# Patient Record
Sex: Female | Born: 1955 | Race: White | Hispanic: No | Marital: Married | Smoking: Never smoker
Health system: Southern US, Community
[De-identification: ages and names within clinical notes are randomized; demographics above are authoritative.]

## PROBLEM LIST (undated history)

## (undated) DIAGNOSIS — T8859XA Other complications of anesthesia, initial encounter: Secondary | ICD-10-CM

## (undated) DIAGNOSIS — H269 Unspecified cataract: Secondary | ICD-10-CM

## (undated) DIAGNOSIS — F32A Depression, unspecified: Secondary | ICD-10-CM

## (undated) DIAGNOSIS — F419 Anxiety disorder, unspecified: Secondary | ICD-10-CM

## (undated) DIAGNOSIS — D649 Anemia, unspecified: Secondary | ICD-10-CM

## (undated) DIAGNOSIS — M8580 Other specified disorders of bone density and structure, unspecified site: Secondary | ICD-10-CM

## (undated) DIAGNOSIS — Z9889 Other specified postprocedural states: Secondary | ICD-10-CM

## (undated) DIAGNOSIS — Z8489 Family history of other specified conditions: Secondary | ICD-10-CM

## (undated) DIAGNOSIS — M3500 Sicca syndrome, unspecified: Secondary | ICD-10-CM

## (undated) DIAGNOSIS — R112 Nausea with vomiting, unspecified: Secondary | ICD-10-CM

## (undated) DIAGNOSIS — M810 Age-related osteoporosis without current pathological fracture: Secondary | ICD-10-CM

## (undated) DIAGNOSIS — R011 Cardiac murmur, unspecified: Secondary | ICD-10-CM

## (undated) DIAGNOSIS — G43909 Migraine, unspecified, not intractable, without status migrainosus: Secondary | ICD-10-CM

## (undated) DIAGNOSIS — K219 Gastro-esophageal reflux disease without esophagitis: Secondary | ICD-10-CM

## (undated) DIAGNOSIS — C801 Malignant (primary) neoplasm, unspecified: Secondary | ICD-10-CM

## (undated) DIAGNOSIS — E559 Vitamin D deficiency, unspecified: Secondary | ICD-10-CM

## (undated) DIAGNOSIS — F329 Major depressive disorder, single episode, unspecified: Secondary | ICD-10-CM

## (undated) DIAGNOSIS — M1990 Unspecified osteoarthritis, unspecified site: Secondary | ICD-10-CM

## (undated) DIAGNOSIS — Q210 Ventricular septal defect: Secondary | ICD-10-CM

## (undated) DIAGNOSIS — T4145XA Adverse effect of unspecified anesthetic, initial encounter: Secondary | ICD-10-CM

## (undated) HISTORY — DX: Migraine, unspecified, not intractable, without status migrainosus: G43.909

## (undated) HISTORY — DX: Sjogren syndrome, unspecified: M35.00

## (undated) HISTORY — DX: Malignant (primary) neoplasm, unspecified: C80.1

## (undated) HISTORY — PX: OTHER SURGICAL HISTORY: SHX169

## (undated) HISTORY — PX: ABLATION ON ENDOMETRIOSIS: SHX5787

## (undated) HISTORY — DX: Other specified disorders of bone density and structure, unspecified site: M85.80

## (undated) HISTORY — DX: Vitamin D deficiency, unspecified: E55.9

## (undated) HISTORY — DX: Ventricular septal defect: Q21.0

## (undated) HISTORY — DX: Unspecified cataract: H26.9

## (undated) HISTORY — DX: Age-related osteoporosis without current pathological fracture: M81.0

## (undated) HISTORY — DX: Gastro-esophageal reflux disease without esophagitis: K21.9

## (undated) HISTORY — DX: Anemia, unspecified: D64.9

## (undated) HISTORY — PX: DILATION AND CURETTAGE OF UTERUS: SHX78

---
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
## 1974-05-12 HISTORY — PX: OTHER SURGICAL HISTORY: SHX169

## 1999-07-11 ENCOUNTER — Other Ambulatory Visit: Admission: RE | Admit: 1999-07-11 | Discharge: 1999-07-11 | Payer: Self-pay | Admitting: Obstetrics & Gynecology

## 1999-09-11 ENCOUNTER — Ambulatory Visit (HOSPITAL_COMMUNITY): Admission: RE | Admit: 1999-09-11 | Discharge: 1999-09-11 | Payer: Self-pay | Admitting: Obstetrics & Gynecology

## 1999-09-11 ENCOUNTER — Encounter (INDEPENDENT_AMBULATORY_CARE_PROVIDER_SITE_OTHER): Payer: Self-pay

## 2000-08-03 ENCOUNTER — Other Ambulatory Visit: Admission: RE | Admit: 2000-08-03 | Discharge: 2000-08-03 | Payer: Self-pay | Admitting: Obstetrics & Gynecology

## 2001-08-12 ENCOUNTER — Other Ambulatory Visit: Admission: RE | Admit: 2001-08-12 | Discharge: 2001-08-12 | Payer: Self-pay | Admitting: Obstetrics & Gynecology

## 2002-09-28 ENCOUNTER — Other Ambulatory Visit: Admission: RE | Admit: 2002-09-28 | Discharge: 2002-09-28 | Payer: Self-pay | Admitting: Obstetrics & Gynecology

## 2003-12-13 ENCOUNTER — Other Ambulatory Visit: Admission: RE | Admit: 2003-12-13 | Discharge: 2003-12-13 | Payer: Self-pay | Admitting: Obstetrics & Gynecology

## 2004-01-16 ENCOUNTER — Ambulatory Visit (HOSPITAL_COMMUNITY): Admission: RE | Admit: 2004-01-16 | Discharge: 2004-01-16 | Payer: Self-pay | Admitting: Obstetrics & Gynecology

## 2005-01-16 ENCOUNTER — Other Ambulatory Visit: Admission: RE | Admit: 2005-01-16 | Discharge: 2005-01-16 | Payer: Self-pay | Admitting: Obstetrics & Gynecology

## 2010-03-28 ENCOUNTER — Other Ambulatory Visit: Admission: RE | Admit: 2010-03-28 | Discharge: 2010-03-28 | Payer: Self-pay | Admitting: Internal Medicine

## 2010-04-15 ENCOUNTER — Ambulatory Visit (HOSPITAL_COMMUNITY)
Admission: RE | Admit: 2010-04-15 | Discharge: 2010-04-15 | Payer: Self-pay | Source: Home / Self Care | Admitting: Internal Medicine

## 2010-04-15 LAB — HM DEXA SCAN

## 2010-05-12 HISTORY — PX: COLONOSCOPY: SHX5424

## 2010-06-03 ENCOUNTER — Encounter
Admission: RE | Admit: 2010-06-03 | Discharge: 2010-06-03 | Payer: Self-pay | Source: Home / Self Care | Attending: Internal Medicine | Admitting: Internal Medicine

## 2010-07-02 ENCOUNTER — Other Ambulatory Visit: Payer: Self-pay | Admitting: Gastroenterology

## 2010-07-02 DIAGNOSIS — R110 Nausea: Secondary | ICD-10-CM

## 2010-07-02 DIAGNOSIS — R6881 Early satiety: Secondary | ICD-10-CM

## 2010-07-04 ENCOUNTER — Ambulatory Visit
Admission: RE | Admit: 2010-07-04 | Discharge: 2010-07-04 | Disposition: A | Discharge status: Home / Self Care | Payer: BC Managed Care – PPO | Source: Ambulatory Visit | Attending: Gastroenterology | Admitting: Gastroenterology

## 2010-07-04 DIAGNOSIS — R110 Nausea: Secondary | ICD-10-CM

## 2010-07-04 DIAGNOSIS — R6881 Early satiety: Secondary | ICD-10-CM

## 2010-09-27 NOTE — Op Note (Signed)
NAME:  Bianca Fernandez, Bianca Fernandez                           ACCOUNT NO.:  [id number]   MEDICAL RECORD NO.:  [id number]                   PATIENT TYPE:  AMB   LOCATION:  SDC                                  FACILITY:  [facility]   PHYSICIAN:  Freddy Finner, M.D.                DATE OF BIRTH:  05-06-1956   DATE OF PROCEDURE:  01/16/2004  DATE OF DISCHARGE:                                 OPERATIVE REPORT   PREOPERATIVE DIAGNOSES:  1.  Uterine enlargement, probable uterine fibroid.  2.  Menorrhagia with secondary anemia.  3.  Known previous diagnosis of endometriosis and uterine enlargement in      2001.   POSTOPERATIVE DIAGNOSES:  1.  Uterine enlargement, probable uterine fibroid.  2.  Menorrhagia with secondary anemia.  3.  Known previous diagnosis of endometriosis and uterine enlargement in      2001.  4.  Small endometrial polyp.   OPERATIVE PROCEDURES:  1.  Hysteroscopy.  2.  Dilatation and curettage.  3.  Resection of endometrial polyp.  4.  NovaSure endometrial ablation.   SURGEON:  Freddy Finner, M.D.   ANESTHESIA:  General and paracervical block.   INTRAOPERATIVE COMPLICATIONS:  None.   INDICATION:  The patient is a 55 year old with a very long history of  menorrhagia.  This has been more pronounced in the recent past, having had  previous, more conservative surgery.  She is now admitted for endometrial  ablation as well as hysteroscopy and D&C.   She was admitted on the morning of surgery because of ventriculoseptal  defect.  She was given amoxicillin and gentamicin IV.  Immediately after the  procedure in recovery, she was given a gram of Rocephin IV.   DESCRIPTION OF PROCEDURE:  She was brought to the operating room and after  being placed under general anesthesia, she was placed in the dorsal  lithotomy position.  Betadine prep was carried out in the usual fashion.  Cervix was visualized using a bivalved speculum.  Cervix was grasped on the  anterior lip with a single-tooth  tenaculum.  Paracervical block was placed  using a total of 10 mL of 1% Xylocaine.  Cervix sounded to 2.5 cm.  Uterus  and cervix sounded to 8.5 cm, making the cavity length 6 cm.  Cervix was  progressively dilated with Shawnie Pons dilators.  A 12.5-degree [facility] hysteroscope  was introduced using 3% sorbitol as the distending medium.  Inspection of  endometrial cavity was carried out.  There was a polypoid-looking lesion on  the anterior fundal surface; no other cavitary abnormality was noted.  Gentle thorough curettage and exploration with Randall stone forceps was  carried out to sample and remove the tissue.  The NovaSure device was then  placed.  The additional measurement was a cavity width which is 3.5 cm.  After an appropriate carbon dioxide test and the integrity of the cavity and  placement  were confirmed, the ablation procedure was carried out; this  required a length of 77 seconds and a power of 116 watts.  Re-inspection of  the cavity was carried out using the hysteroscope and photographs were made  of this.  The procedure was then terminated and all instruments removed.  The patient was awakened and taken to the recovery room in good condition.  She was given 30 mg of Toradol IV in the operating room and 30 mg IM for  postoperative analgesia.   She is to return to the office in approximately 7-10 days.  She has routine  outpatient surgical instructions.                                               Freddy Finner, M.D.    WRN/MEDQ  D:  01/16/2004  T:  01/17/2004  Job:  [id number]

## 2011-01-30 ENCOUNTER — Other Ambulatory Visit (HOSPITAL_COMMUNITY): Payer: Self-pay | Admitting: Internal Medicine

## 2011-02-04 ENCOUNTER — Ambulatory Visit (HOSPITAL_COMMUNITY)
Admission: RE | Admit: 2011-02-04 | Discharge: 2011-02-04 | Disposition: A | Discharge status: Home / Self Care | Payer: BC Managed Care – PPO | Source: Ambulatory Visit | Attending: Internal Medicine | Admitting: Internal Medicine

## 2011-02-04 DIAGNOSIS — R6889 Other general symptoms and signs: Secondary | ICD-10-CM | POA: Insufficient documentation

## 2011-02-04 DIAGNOSIS — M47812 Spondylosis without myelopathy or radiculopathy, cervical region: Secondary | ICD-10-CM | POA: Insufficient documentation

## 2011-02-04 DIAGNOSIS — R1310 Dysphagia, unspecified: Secondary | ICD-10-CM | POA: Insufficient documentation

## 2011-02-04 DIAGNOSIS — K449 Diaphragmatic hernia without obstruction or gangrene: Secondary | ICD-10-CM | POA: Insufficient documentation

## 2011-06-13 HISTORY — PX: OTHER SURGICAL HISTORY: SHX169

## 2011-06-24 LAB — FECAL OCCULT BLOOD, GUAIAC: Fecal Occult Blood: NEGATIVE

## 2012-08-19 ENCOUNTER — Other Ambulatory Visit: Payer: Self-pay | Admitting: Gastroenterology

## 2012-08-19 DIAGNOSIS — R1310 Dysphagia, unspecified: Secondary | ICD-10-CM

## 2012-08-20 ENCOUNTER — Ambulatory Visit
Admission: RE | Admit: 2012-08-20 | Discharge: 2012-08-20 | Disposition: A | Discharge status: Home / Self Care | Payer: BC Managed Care – PPO | Source: Ambulatory Visit | Attending: Gastroenterology | Admitting: Gastroenterology

## 2012-08-20 DIAGNOSIS — R1310 Dysphagia, unspecified: Secondary | ICD-10-CM

## 2013-01-28 HISTORY — PX: ESOPHAGOGASTRODUODENOSCOPY: SHX1529

## 2013-04-18 ENCOUNTER — Encounter: Payer: Self-pay | Admitting: Internal Medicine

## 2013-04-18 DIAGNOSIS — M810 Age-related osteoporosis without current pathological fracture: Secondary | ICD-10-CM | POA: Insufficient documentation

## 2013-04-18 DIAGNOSIS — M3500 Sicca syndrome, unspecified: Secondary | ICD-10-CM | POA: Insufficient documentation

## 2013-04-18 DIAGNOSIS — M8580 Other specified disorders of bone density and structure, unspecified site: Secondary | ICD-10-CM | POA: Insufficient documentation

## 2013-04-18 DIAGNOSIS — E559 Vitamin D deficiency, unspecified: Secondary | ICD-10-CM | POA: Insufficient documentation

## 2013-04-18 DIAGNOSIS — G43909 Migraine, unspecified, not intractable, without status migrainosus: Secondary | ICD-10-CM | POA: Insufficient documentation

## 2013-04-18 DIAGNOSIS — Q210 Ventricular septal defect: Secondary | ICD-10-CM | POA: Insufficient documentation

## 2013-04-19 ENCOUNTER — Encounter: Payer: Self-pay | Admitting: Emergency Medicine

## 2013-04-19 ENCOUNTER — Ambulatory Visit (INDEPENDENT_AMBULATORY_CARE_PROVIDER_SITE_OTHER): Payer: BC Managed Care – PPO | Admitting: Emergency Medicine

## 2013-04-19 VITALS — BP 150/82 | HR 78 | Temp 98.0°F | Resp 16 | Ht 68.25 in | Wt 158.0 lb

## 2013-04-19 DIAGNOSIS — R1310 Dysphagia, unspecified: Secondary | ICD-10-CM

## 2013-04-19 DIAGNOSIS — E559 Vitamin D deficiency, unspecified: Secondary | ICD-10-CM

## 2013-04-19 DIAGNOSIS — Z79899 Other long term (current) drug therapy: Secondary | ICD-10-CM

## 2013-04-19 DIAGNOSIS — Z0000 Encounter for general adult medical examination without abnormal findings: Secondary | ICD-10-CM

## 2013-04-19 DIAGNOSIS — M3500 Sicca syndrome, unspecified: Secondary | ICD-10-CM

## 2013-04-19 DIAGNOSIS — D649 Anemia, unspecified: Secondary | ICD-10-CM

## 2013-04-19 DIAGNOSIS — Z1212 Encounter for screening for malignant neoplasm of rectum: Secondary | ICD-10-CM

## 2013-04-19 DIAGNOSIS — Z111 Encounter for screening for respiratory tuberculosis: Secondary | ICD-10-CM

## 2013-04-19 DIAGNOSIS — R030 Elevated blood-pressure reading, without diagnosis of hypertension: Secondary | ICD-10-CM

## 2013-04-19 DIAGNOSIS — Z23 Encounter for immunization: Secondary | ICD-10-CM

## 2013-04-19 LAB — CBC WITH DIFFERENTIAL/PLATELET
Basophils Absolute: 0 10*3/uL (ref 0.0–0.1)
Eosinophils Absolute: 0.1 10*3/uL (ref 0.0–0.7)
Lymphs Abs: 2.1 10*3/uL (ref 0.7–4.0)
MCH: 29.4 pg (ref 26.0–34.0)
MCV: 85.4 fL (ref 78.0–100.0)
Monocytes Absolute: 0.6 10*3/uL (ref 0.1–1.0)
Platelets: 300 10*3/uL (ref 150–400)

## 2013-04-19 LAB — HEPATIC FUNCTION PANEL
Alkaline Phosphatase: 71 U/L (ref 39–117)
Indirect Bilirubin: 0.6 mg/dL (ref 0.0–0.9)

## 2013-04-19 LAB — BASIC METABOLIC PANEL WITH GFR
CO2: 28 mEq/L (ref 19–32)
Chloride: 94 mEq/L — ABNORMAL LOW (ref 96–112)
Creat: 0.59 mg/dL (ref 0.50–1.10)
GFR, Est African American: >89 mL/min
GFR, Est Non African American: >89 mL/min
Potassium: 4.5 mEq/L (ref 3.5–5.3)

## 2013-04-19 LAB — HEMOGLOBIN A1C: Mean Plasma Glucose: 111 mg/dL (ref <117)

## 2013-04-19 LAB — LIPID PANEL
LDL Cholesterol: 89 mg/dL (ref 0–99)
Total CHOL/HDL Ratio: 2.1 Ratio
VLDL: 10 mg/dL (ref 0–40)

## 2013-04-19 NOTE — Patient Instructions (Addendum)
Hypertension Hypertension is another name for high blood pressure. High blood pressure may mean that your heart needs to work harder to pump blood. Blood pressure consists of two numbers, which includes a higher number over a lower number (example: 110/72). HOME CARE   Make lifestyle changes as told by your doctor. This may include weight loss and exercise.  Take your blood pressure medicine every day.  Limit how much salt you use.  Stop smoking if you smoke.  Do not use drugs.  Talk to your doctor if you are using decongestants or birth control pills. These medicines might make blood pressure higher.  Females should not drink more than 1 alcoholic drink per day. Males should not drink more than 2 alcoholic drinks per day.  See your doctor as told. GET HELP RIGHT AWAY IF:   You have a blood pressure reading with a top number of 180 or higher.  You get a very bad headache.  You get blurred or changing vision.  You feel confused.  You feel weak, numb, or faint.  You get chest or belly (abdominal) pain.  You throw up (vomit).  You cannot breathe very well. MAKE SURE YOU:   Understand these instructions.  Will watch your condition.  Will get help right away if you are not doing well or get worse. Document Released: 10/15/2007 Document Revised: 07/21/2011 Document Reviewed: 10/15/2007 [facility] Patient Information 2014 [place], [place]. Dysphagia Swallowing problems (dysphagia) occur when solids and liquids seem to stick in your throat on the way down to your stomach, or the food takes longer to get to the stomach. Other symptoms include regurgitating food, noises coming from the throat, chest discomfort with swallowing, and a feeling of fullness or the feeling of something being stuck in your throat when swallowing. When blockage in your throat is complete it may be associated with drooling. CAUSES  Problems with swallowing may occur because of problems with the muscles. The  food cannot be propelled in the usual manner into your stomach. You may have ulcers, scar tissue, or inflammation in the tube down which food travels from your mouth to your stomach (esophagus), which blocks food from passing normally into the stomach. Causes of inflammation include:  Acid reflux from your stomach into your esophagus.  Infection.  Radiation treatment for cancer.  Medicines taken without enough fluids to wash them down into your stomach. You may have nerve problems that prevent signals from being sent to the muscles of your esophagus to contract and move your food down to your stomach. Globus pharyngeus is a relatively common problem in which there is a sense of an obstruction or difficulty in swallowing, without any physical abnormalities of the swallowing passages being found. This problem usually improves over time with reassurance and testing to rule out other causes. DIAGNOSIS Dysphagia can be diagnosed and its cause can be determined by tests in which you swallow a white substance that helps illuminate the inside of your throat (contrast medium) while X-rays are taken. Sometimes a flexible telescope that is inserted down your throat (endoscopy) to look at your esophagus and stomach is used. TREATMENT   If the dysphagia is caused by acid reflux or infection, medicines may be used.  If the dysphagia is caused by problems with your swallowing muscles, swallowing therapy may be used to help you strengthen your swallowing muscles.  If the dysphagia is caused by a blockage or mass, procedures to remove the blockage may be done. HOME CARE INSTRUCTIONS  Try to eat soft food that is easier to swallow and check your weight on a daily basis to be sure that it is not decreasing.  Be sure to drink liquids when sitting upright (not lying down). SEEK MEDICAL CARE IF:  You are losing weight because you are unable to swallow.  You are coughing when you drink liquids  (aspiration).  You are coughing up partially digested food. SEEK IMMEDIATE MEDICAL CARE IF:  You are unable to swallow your own saliva .  You are having shortness of breath or a fever, or both.  You have a hoarse voice along with difficulty swallowing. MAKE SURE YOU:  Understand these instructions.  Will watch your condition.  Will get help right away if you are not doing well or get worse. Document Released: 04/25/2000 Document Revised: 12/29/2012 Document Reviewed: 10/15/2012 [facility] Patient Information 2014 [place], [place].

## 2013-04-20 LAB — VITAMIN D 25 HYDROXY (VIT D DEFICIENCY, FRACTURES): Vit D, 25-Hydroxy: 49 ng/mL (ref 30–89)

## 2013-04-20 LAB — URINALYSIS, ROUTINE W REFLEX MICROSCOPIC
Bilirubin Urine: NEGATIVE
Glucose, UA: NEGATIVE mg/dL
Specific Gravity, Urine: 1.013 (ref 1.005–1.030)
Urobilinogen, UA: 0.2 mg/dL (ref 0.0–1.0)
pH: 7 (ref 5.0–8.0)

## 2013-04-20 LAB — MICROALBUMIN / CREATININE URINE RATIO: Microalb, Ur: 0.51 mg/dL (ref 0.00–1.89)

## 2013-04-20 LAB — TSH: TSH: 4.006 u[IU]/mL (ref 0.350–4.500)

## 2013-04-21 NOTE — Progress Notes (Signed)
Subjective:    Patient ID: Bianca Fernandez, female    DOB: [birth year], 57 y.o.   MRN: [id number]  HPI Comments: 57 yo WF for CPE and f/u anemia, Vit D Deficiency. She is doing reasonably well. She notes she is not exercising routinely. She eats healthy for the most part but noticeable decreased appetite with increased dysphagia. She had repeat EGD with Dr.Schooler again negative. She also notes no relief with protonix with reflux. She often feels something is stuck in throat, even if she has not had any recent food or beverage.   She does not routinely check her BP at home but last 2 OV with BP of 140s/70s. She denies history of HTN but notes + for VSD. She has a heart murmur and abdominal bruit that Dr. Royann Shivers "keeps active check on", she denies any changes of these.  She had an abnormal PAP last year with repeat by Dr. Jennette Kettle 07/24/12 WNL.  Her Sjogrens is followed by Dr. Dareen Piano and seems stable, she has appointment 05/2013. She has been off the Prednisone for a couple of months.  She notes her anxiety has improved and is no longer on medication.     Current Outpatient Prescriptions on File Prior to Visit  Medication Sig Dispense Refill  . B Complex-C (SUPER B COMPLEX PO) Take by mouth.      . calcium-vitamin D (OSCAL WITH D) 250-125 MG-UNIT per tablet Take 1 tablet by mouth daily.      . Cholecalciferol (VITAMIN D3) 3000 UNITS TABS Take by mouth.      . Magnesium 250 MG TABS Take by mouth.      . esomeprazole (NEXIUM) 40 MG capsule Take 40 mg by mouth daily at 12 noon.      . pantoprazole (PROTONIX) 40 MG tablet Take 40 mg by mouth daily.       No current facility-administered medications on file prior to visit.   ALLERGIES Vivelle  Past Medical History  Diagnosis Date  . GERD (gastroesophageal reflux disease)   . Sjogren's disease   . VSD (ventricular septal defect), perimembranous   . Anemia   . Osteopenia   . Unspecified vitamin D deficiency   . Migraines     Past  Surgical History  Procedure Laterality Date  . Esophagogastroduodenoscopy  01/28/13    Dr Bosie Clos- dysmotility  . Dilation and curettage of uterus      x 2  . Ablation on endometriosis    . Cervical conization  1976  . Lip biopsy      + sjogrens    History  Substance Use Topics  . Smoking status: Never Smoker   . Smokeless tobacco: Not on file  . Alcohol Use: Yes     Comment: rare    Family History  Problem Relation Age of Onset  . Hypertension Mother   . Stroke Mother   . Hypertension Father   . Stroke Father   . Cancer Father     bladder  . Cancer Maternal Aunt     ovarian  . Cancer Maternal Grandmother     uterine  . Cancer Cousin 60    breast, deceased  . ALS Other      Review of Systems  Constitutional: Positive for fatigue.  HENT: Positive for trouble swallowing and voice change.   Eyes:       Dr. [place] 03/29/13 WNL  Respiratory: Positive for choking.        Dr. Sherene Sires PRN  Cardiovascular:  Dr. Rubie Maid 05/06/13  Gastrointestinal:       Dr Brenton Grills EGD -01/2013  Endocrine:       Rheum- Dr. Dareen Piano 1/15  Skin:       Dr. [place] 03/12/13 WNL  All other systems reviewed and are negative.    BP 150/82  Pulse 78  Temp(Src) 98 F (36.7 C) (Temporal)  Resp 16  Ht 5' 8.25" (1.734 m)  Wt 158 lb (71.668 kg)  BMI 23.84 kg/m2     Objective:   Physical Exam  Nursing note and vitals reviewed. Constitutional: She is oriented to person, place, and time. She appears well-developed and well-nourished. No distress.  HENT:  Head: Normocephalic and atraumatic.  Right Ear: External ear normal.  Left Ear: External ear normal.  Nose: Nose normal.  Mouth/Throat: Oropharynx is clear and moist. No oropharyngeal exudate.  Eyes: Conjunctivae and EOM are normal. Pupils are equal, round, and reactive to light. Right eye exhibits no discharge. Left eye exhibits no discharge. No scleral icterus.  Neck: Normal range of motion. Neck supple. No JVD present. No  tracheal deviation present. No thyromegaly present.  Cardiovascular: Normal rate, regular rhythm and intact distal pulses.   Murmur heard. 3/6 + abdominal bruit- hard to discern if radiation from heart murmur with VSD  Pulmonary/Chest: Effort normal and breath sounds normal.  Abdominal: Soft. Bowel sounds are normal. She exhibits no distension and no mass. There is no tenderness. There is no rebound and no guarding.  Genitourinary:  Defer to GYN  Musculoskeletal: Normal range of motion. She exhibits no edema and no tenderness.  Lymphadenopathy:    She has no cervical adenopathy.  Neurological: She is alert and oriented to person, place, and time. She has normal reflexes. No cranial nerve deficit. She exhibits normal muscle tone. Coordination normal.  Skin: Skin is warm and dry. No rash noted. No erythema. No pallor.  Psychiatric: She has a normal mood and affect. Her behavior is normal. Judgment and thought content normal.      EKG WNL    Assessment & Plan:  1. CPE, D Deficiency, anemia- Check screening labs 2. Elevated BP without HTN- Check BP if greater than 130/80 call office 3. Dysphagia with hx of Sjogren's- Get u/s ST neck, with negative EGD

## 2013-04-22 LAB — TB SKIN TEST: Induration: 0 mm

## 2013-04-25 ENCOUNTER — Ambulatory Visit
Admission: RE | Admit: 2013-04-25 | Discharge: 2013-04-25 | Disposition: A | Discharge status: Home / Self Care | Payer: BC Managed Care – PPO | Source: Ambulatory Visit | Attending: Emergency Medicine | Admitting: Emergency Medicine

## 2013-04-25 DIAGNOSIS — R1310 Dysphagia, unspecified: Secondary | ICD-10-CM

## 2013-05-18 NOTE — Progress Notes (Signed)
1 mo f/u scheduled w/ MS 06-15-13 at 10:30am

## 2013-05-19 ENCOUNTER — Encounter: Payer: Self-pay | Admitting: Cardiovascular Disease

## 2013-05-19 ENCOUNTER — Ambulatory Visit (INDEPENDENT_AMBULATORY_CARE_PROVIDER_SITE_OTHER): Payer: BC Managed Care – PPO | Admitting: Cardiovascular Disease

## 2013-05-19 VITALS — BP 136/72 | HR 92 | Resp 20 | Ht 68.0 in | Wt 160.4 lb

## 2013-05-19 DIAGNOSIS — Q210 Ventricular septal defect: Secondary | ICD-10-CM

## 2013-05-19 NOTE — Assessment & Plan Note (Signed)
She is asymptomatic and previous echocardiography performed serially over the years has shown no evidence of right heart enlargement or pulmonary hypertension. I suggested that we perform echocardiographic surveillance on an every five-year basis. Plan to do an echo after her visit next year. Reinforced the need for endocarditis prophylaxis although current guidelines suggest a more relaxed approach for dental procedures.

## 2013-05-19 NOTE — Progress Notes (Signed)
Patient ID: Bianca Fernandez, female   DOB: [birth year], 58 y.o.   MRN: [id number]     Reason for office visit Ventricular septal defect  Bianca Fernandez is a 58 year old woman with a restrictive perimembranous ventricular septal defect without evidence of hemodynamic strain. Multiple echoes performed over the years have not shown evidence of right heart enlargement or pulmonary hypertension. She denies problems with palpitations, shortness of breath, lower showed edema or exertional chest discomfort. Her biggest problem right now is with swallowing difficulties and she is seeing a gastroenterologist.   Allergies  Allergen Reactions  . Vivelle [Estradiol]     itch    Current Outpatient Prescriptions  Medication Sig Dispense Refill  . B Complex-C (SUPER B COMPLEX PO) Take by mouth.      . calcium-vitamin D (OSCAL WITH D) 250-125 MG-UNIT per tablet Take 1 tablet by mouth daily.      . Cholecalciferol (VITAMIN D3) 3000 UNITS TABS Take by mouth.      . Magnesium 250 MG TABS Take by mouth.      . pantoprazole (PROTONIX) 40 MG tablet Take 40 mg by mouth daily.       No current facility-administered medications for this visit.    Past Medical History  Diagnosis Date  . GERD (gastroesophageal reflux disease)   . Sjogren's disease   . VSD (ventricular septal defect), perimembranous   . Anemia   . Osteopenia   . Unspecified vitamin D deficiency   . Migraines     Past Surgical History  Procedure Laterality Date  . Esophagogastroduodenoscopy  01/28/13    Dr Bosie ClosSchooler- dysmotility  . Dilation and curettage of uterus      x 2  . Ablation on endometriosis    . Cervical conization  1976  . Lip biopsy      + sjogrens    Family History  Problem Relation Age of Onset  . Hypertension Mother   . Stroke Mother   . Hypertension Father   . Stroke Father   . Cancer Father     bladder  . Cancer Maternal Aunt     ovarian  . Cancer Maternal Grandmother     uterine  . Cancer Cousin 60   breast, deceased  . ALS Other     History   Social History  . Marital Status: Married    Spouse Name: N/A    Number of Children: N/A  . Years of Education: N/A   Occupational History  . Not on file.   Social History Main Topics  . Smoking status: Never Smoker   . Smokeless tobacco: Not on file  . Alcohol Use: Yes     Comment: rare  . Drug Use: No  . Sexual Activity: Not on file   Other Topics Concern  . Not on file   Social History Narrative  . No narrative on file    Review of systems: The patient specifically denies any chest pain at rest or with exertion, dyspnea at rest or with exertion, orthopnea, paroxysmal nocturnal dyspnea, syncope, palpitations, focal neurological deficits, intermittent claudication, lower extremity edema, unexplained weight gain, cough, hemoptysis or wheezing.  The patient also denies abdominal pain, nausea, vomiting, diarrhea, constipation, polyuria, polydipsia, dysuria, hematuria, frequency, urgency, abnormal bleeding or bruising, fever, chills, unexpected weight changes, mood swings, change in skin or hair texture, change in voice quality, auditory or visual problems, allergic reactions or rashes, new musculoskeletal complaints other than usual "aches and pains".   PHYSICAL EXAM BP  136/72  Pulse 92  Resp 20  Ht 5\' 8"  (1.727 m)  Wt 160 lb 6.4 oz (72.757 kg)  BMI 24.39 kg/m2  General: Alert, oriented x3, no distress Head: no evidence of trauma, PERRL, EOMI, no exophtalmos or lid lag, no myxedema, no xanthelasma; normal ears, nose and oropharynx Neck: normal jugular venous pulsations and no hepatojugular reflux; brisk carotid pulses without delay and no carotid bruits Chest: clear to auscultation, no signs of consolidation by percussion or palpation, normal fremitus, symmetrical and full respiratory excursions Cardiovascular: normal position and quality of the apical impulse, regular rhythm, normal first and second heart sounds, no rubs or  gallops, grade 2/6 holosystolic murmur heard best in the mid precordium but radiating broadly throughout the chest Abdomen: no tenderness or distention, no masses by palpation, no abnormal pulsatility or arterial bruits, normal bowel sounds, no hepatosplenomegaly Extremities: no clubbing, cyanosis or edema; 2+ radial, ulnar and brachial pulses bilaterally; 2+ right femoral, posterior tibial and dorsalis pedis pulses; 2+ left femoral, posterior tibial and dorsalis pedis pulses; no subclavian or femoral bruits Neurological: grossly nonfocal   EKG: Normal sinus rhythm  Lipid Panel     Component Value Date/Time   CHOL 189 04/19/2013 1506   TRIG 51 04/19/2013 1506   HDL 90 04/19/2013 1506   CHOLHDL 2.1 04/19/2013 1506   VLDL 10 04/19/2013 1506   LDLCALC 89 04/19/2013 1506    BMET    Component Value Date/Time   NA 133* 04/19/2013 1506   K 4.5 04/19/2013 1506   CL 94* 04/19/2013 1506   CO2 28 04/19/2013 1506   GLUCOSE 88 04/19/2013 1506   BUN 8 04/19/2013 1506   CREATININE 0.59 04/19/2013 1506   CALCIUM 10.0 04/19/2013 1506     ASSESSMENT AND PLAN VSD (ventricular septal defect), perimembranous She is asymptomatic and previous echocardiography performed serially over the years has shown no evidence of right heart enlargement or pulmonary hypertension. I suggested that we perform echocardiographic surveillance on an every five-year basis. Plan to do an echo after her visit next year. Reinforced the need for endocarditis prophylaxis although current guidelines suggest a more relaxed approach for dental procedures.   Junious Silk, MD, [facility] [phone] office [phone] pager

## 2013-05-19 NOTE — Patient Instructions (Signed)
Your physician recommends that you schedule a follow-up appointment in: 12 months.  

## 2013-06-15 ENCOUNTER — Ambulatory Visit (INDEPENDENT_AMBULATORY_CARE_PROVIDER_SITE_OTHER): Payer: BC Managed Care – PPO | Admitting: Emergency Medicine

## 2013-06-15 ENCOUNTER — Encounter: Payer: Self-pay | Admitting: Emergency Medicine

## 2013-06-15 VITALS — BP 138/70 | HR 80 | Temp 98.0°F | Resp 16 | Ht 68.25 in | Wt 159.0 lb

## 2013-06-15 DIAGNOSIS — E871 Hypo-osmolality and hyponatremia: Secondary | ICD-10-CM

## 2013-06-15 NOTE — Patient Instructions (Addendum)
Hyponatremia  Hyponatremia is when the salt (sodium) in your blood is low. When salt becomes low, your cells take in extra water and puff up (swell). The puffiness can happen in the whole body. It mostly affects the brain and is very serious.  HOME CARE  Only take medicine as told by your doctor.  Follow any diet instructions you were given. This includes limiting how much fluid you drink.  Keep all doctor visits for tests as told.  Avoid alcohol and drugs. GET HELP RIGHT AWAY IF:  You start to twitch and shake (seize).  You pass out (faint).  You continue to have watery poop (diarrhea) or you throw up (vomit).  You feel sick to your stomach (nauseous).  You are tired (fatigued), have a headache, are confused, or feel weak.  Your problems that first brought you to the doctor come back.  You have trouble following your diet instructions. MAKE SURE YOU:   Understand these instructions.  Will watch your condition.  Will get help right away if you are not doing well or get worse. Document Released: 01/08/2011 Document Revised: 07/21/2011 Document Reviewed: 01/08/2011 [facility] Patient Information 2014 [place], [place].   *Periden C for HOTFLASHES/ Menopause*

## 2013-06-17 NOTE — Progress Notes (Addendum)
   Subjective:    Patient ID: Bianca Fernandez, female    DOB: 07-13-1955, 58 y.o.   MRN: [id number]  HPI Comments: 58 yo female with Sjogren's for f/u with Hyponatremia on recent labs. She notes she is feeling a little better overall and had recent visit with Dr Dareen PianoAnderson and he rechecked labs and she thinks they had improved.  LAST bmp CREATININE     0.59   04/19/2013 BUN               8   04/19/2013 NA              133   04/19/2013 K               4.5   04/19/2013 CL               94   04/19/2013 CO2              28   04/19/2013    Current Outpatient Prescriptions on File Prior to Visit  Medication Sig Dispense Refill  . B Complex-C (SUPER B COMPLEX PO) Take by mouth.      . Magnesium 250 MG TABS Take by mouth.      . pantoprazole (PROTONIX) 40 MG tablet Take 40 mg by mouth daily.       No current facility-administered medications on file prior to visit.    No Active Allergies Past Medical History  Diagnosis Date  . GERD (gastroesophageal reflux disease)   . Sjogren's disease   . VSD (ventricular septal defect), perimembranous   . Anemia   . Osteopenia   . Unspecified vitamin D deficiency   . Migraines       Review of Systems  All other systems reviewed and are negative.  BP 138/70  Pulse 80  Temp(Src) 98 F (36.7 C) (Temporal)  Resp 16  Ht 5' 8.25" (1.734 m)  Wt 159 lb (72.122 kg)  BMI 23.99 kg/m2      Objective:   Physical Exam  Nursing note and vitals reviewed. Constitutional: She is oriented to person, place, and time. She appears well-developed and well-nourished.  HENT:  Head: Normocephalic and atraumatic.  Right Ear: External ear normal.  Left Ear: External ear normal.  Nose: Nose normal.  Mouth/Throat: Oropharynx is clear and moist. No oropharyngeal exudate.  Eyes: Conjunctivae and EOM are normal.  Neck: Normal range of motion.  Cardiovascular: Normal rate, regular rhythm, normal heart sounds and intact distal pulses.   Pulmonary/Chest: Effort normal  and breath sounds normal.  Abdominal: Soft. Bowel sounds are normal. There is no tenderness.  Musculoskeletal: Normal range of motion.  Lymphadenopathy:    She has no cervical adenopathy.  Neurological: She is alert and oriented to person, place, and time.  Skin: Skin is warm and dry.  Psychiatric: She has a normal mood and affect. Judgment normal.          Assessment & Plan:  1. Hyponatremia- recheck, w/c to get labs from Dr Dareen PianoAnderson with repeat last week for evaluation  06/22/13 reviewed Dr Ewell PoeAnderson's labs, Will advise needs CXR, 1 month Recheck lab only  NA 131 Chloride 94

## 2013-06-22 NOTE — Addendum Note (Signed)
Addended by: Loree FeeSMITH, Alisyn Lequire R on: 06/22/2013 01:26 PM   Modules accepted: Orders

## 2013-07-13 ENCOUNTER — Ambulatory Visit
Admission: RE | Admit: 2013-07-13 | Discharge: 2013-07-13 | Disposition: A | Discharge status: Home / Self Care | Payer: BC Managed Care – PPO | Source: Ambulatory Visit | Attending: Emergency Medicine | Admitting: Emergency Medicine

## 2013-07-13 DIAGNOSIS — E871 Hypo-osmolality and hyponatremia: Secondary | ICD-10-CM

## 2013-07-27 ENCOUNTER — Other Ambulatory Visit: Payer: Self-pay | Admitting: Emergency Medicine

## 2013-07-27 ENCOUNTER — Other Ambulatory Visit: Payer: BLUE CROSS/BLUE SHIELD

## 2013-07-27 DIAGNOSIS — I10 Essential (primary) hypertension: Secondary | ICD-10-CM

## 2013-07-27 DIAGNOSIS — R6889 Other general symptoms and signs: Secondary | ICD-10-CM

## 2013-07-27 DIAGNOSIS — Z79899 Other long term (current) drug therapy: Secondary | ICD-10-CM

## 2013-07-27 LAB — BASIC METABOLIC PANEL WITH GFR
BUN: 10 mg/dL (ref 6–23)
CO2: 29 mEq/L (ref 19–32)
CREATININE: 0.67 mg/dL (ref 0.50–1.10)
Calcium: 9.2 mg/dL (ref 8.4–10.5)
Chloride: 98 mEq/L (ref 96–112)
Glucose, Bld: 101 mg/dL — ABNORMAL HIGH (ref 70–99)
POTASSIUM: 4.5 meq/L (ref 3.5–5.3)
Sodium: 135 mEq/L (ref 135–145)

## 2013-07-27 LAB — MAGNESIUM: MAGNESIUM: 2 mg/dL (ref 1.5–2.5)

## 2014-01-17 ENCOUNTER — Ambulatory Visit (INDEPENDENT_AMBULATORY_CARE_PROVIDER_SITE_OTHER): Payer: BC Managed Care – PPO | Admitting: Physician Assistant

## 2014-01-17 ENCOUNTER — Encounter: Payer: Self-pay | Admitting: Physician Assistant

## 2014-01-17 VITALS — BP 138/80 | HR 100 | Temp 97.9°F | Resp 16 | Ht 68.25 in | Wt 178.0 lb

## 2014-01-17 DIAGNOSIS — N390 Urinary tract infection, site not specified: Secondary | ICD-10-CM

## 2014-01-17 DIAGNOSIS — R109 Unspecified abdominal pain: Secondary | ICD-10-CM

## 2014-01-17 DIAGNOSIS — R110 Nausea: Secondary | ICD-10-CM

## 2014-01-17 DIAGNOSIS — N3000 Acute cystitis without hematuria: Secondary | ICD-10-CM

## 2014-01-17 LAB — CBC WITH DIFFERENTIAL/PLATELET
BASOS PCT: 1 % (ref 0–1)
Basophils Absolute: 0.1 10*3/uL (ref 0.0–0.1)
EOS ABS: 0.1 10*3/uL (ref 0.0–0.7)
Eosinophils Relative: 1 % (ref 0–5)
HCT: 38.1 % (ref 36.0–46.0)
HEMOGLOBIN: 13.1 g/dL (ref 12.0–15.0)
Lymphocytes Relative: 26 % (ref 12–46)
Lymphs Abs: 2.3 10*3/uL (ref 0.7–4.0)
MCH: 29.4 pg (ref 26.0–34.0)
MCHC: 34.4 g/dL (ref 30.0–36.0)
MCV: 85.6 fL (ref 78.0–100.0)
MONOS PCT: 6 % (ref 3–12)
Monocytes Absolute: 0.5 10*3/uL (ref 0.1–1.0)
NEUTROS ABS: 5.8 10*3/uL (ref 1.7–7.7)
NEUTROS PCT: 66 % (ref 43–77)
Platelets: 318 10*3/uL (ref 150–400)
RBC: 4.45 MIL/uL (ref 3.87–5.11)
RDW: 14.2 % (ref 11.5–15.5)
WBC: 8.8 10*3/uL (ref 4.0–10.5)

## 2014-01-17 LAB — HEPATIC FUNCTION PANEL
ALT: 28 U/L (ref 0–35)
AST: 27 U/L (ref 0–37)
Albumin: 4.6 g/dL (ref 3.5–5.2)
Alkaline Phosphatase: 86 U/L (ref 39–117)
Bilirubin, Direct: 0.1 mg/dL (ref 0.0–0.3)
Indirect Bilirubin: 0.4 mg/dL (ref 0.2–1.2)
TOTAL PROTEIN: 7.3 g/dL (ref 6.0–8.3)
Total Bilirubin: 0.5 mg/dL (ref 0.2–1.2)

## 2014-01-17 LAB — BASIC METABOLIC PANEL WITH GFR
BUN: 9 mg/dL (ref 6–23)
CO2: 28 mEq/L (ref 19–32)
Calcium: 10.1 mg/dL (ref 8.4–10.5)
Chloride: 99 mEq/L (ref 96–112)
Creat: 0.72 mg/dL (ref 0.50–1.10)
GFR, Est Non African American: >89 mL/min
GLUCOSE: 101 mg/dL — AB (ref 70–99)
POTASSIUM: 4.5 meq/L (ref 3.5–5.3)
Sodium: 136 mEq/L (ref 135–145)

## 2014-01-17 MED ORDER — ACETAMINOPHEN-CODEINE #3 300-30 MG PO TABS: 1.0000 | ORAL_TABLET | ORAL | Status: DC | PRN

## 2014-01-17 NOTE — Patient Instructions (Signed)
Flank Pain °Flank pain refers to pain that is located on the side of the body between the upper abdomen and the back. The pain may occur over a short period of time (acute) or may be long-term or reoccurring (chronic). It may be mild or severe. Flank pain can be caused by many things. °CAUSES  °Some of the more common causes of flank pain include: °· Muscle strains.   °· Muscle spasms.   °· A disease of your spine (vertebral disk disease).   °· A lung infection (pneumonia).   °· Fluid around your lungs (pulmonary edema).   °· A kidney infection.   °· Kidney stones.   °· A very painful skin rash caused by the chickenpox virus (shingles).   °· Gallbladder disease.   °HOME CARE INSTRUCTIONS  °Home care will depend on the cause of your pain. In general, °· Rest as directed by your caregiver. °· Drink enough fluids to keep your urine clear or pale yellow. °· Only take over-the-counter or prescription medicines as directed by your caregiver. Some medicines may help relieve the pain. °· Tell your caregiver about any changes in your pain. °· Follow up with your caregiver as directed. °SEEK IMMEDIATE MEDICAL CARE IF:  °· Your pain is not controlled with medicine.   °· You have new or worsening symptoms. °· Your pain increases.   °· You have abdominal pain.   °· You have shortness of breath.   °· You have persistent nausea or vomiting.   °· You have swelling in your abdomen.   °· You feel faint or pass out.   °· You have blood in your urine. °· You have a fever or persistent symptoms for more than 2-3 days. °· You have a fever and your symptoms suddenly get worse. °MAKE SURE YOU:  °· Understand these instructions. °· Will watch your condition. °· Will get help right away if you are not doing well or get worse. °Document Released: 06/19/2005 Document Revised: 01/21/2012 Document Reviewed: 12/11/2011 °[employer]® Patient Information ©2015 [employer]. This information is not intended to replace advice given to you by your  health care provider. Make sure you discuss any questions you have with your health care provider. ° °

## 2014-01-17 NOTE — Progress Notes (Signed)
   Subjective:    Patient ID: MELE SYLVESTER, female    DOB: 1955/08/22, 58 y.o.   MRN: [id number]  HPI 58 y.o. female, with left flank pain since Saturday. She fell asleep on the cough and woke up with left lower/flank pain, sharp pains on left side intermittently. On Sunday she started to have left leg pain with position change/standing/sitting down anterior leg to knee and she had decreased appetite due to nausea. The pain is worse with movement/change in positions but not with palpation. Denies vomiting, diarrhea, last BM Saturday but she has history of constipation, passing gas, denies blood in stool/black stool, no rash, fever, chills.    Review of Systems  Constitutional: Positive for appetite change. Negative for fever, chills, diaphoresis, activity change, fatigue and unexpected weight change.  HENT: Negative.   Respiratory: Negative.   Cardiovascular: Negative.   Gastrointestinal: Positive for nausea, abdominal pain and constipation. Negative for vomiting, diarrhea, blood in stool, abdominal distention, anal bleeding and rectal pain.  Genitourinary: Positive for frequency and flank pain. Negative for dysuria, urgency, hematuria, decreased urine volume, vaginal bleeding, vaginal discharge, enuresis, difficulty urinating, genital sores, vaginal pain, menstrual problem, pelvic pain and dyspareunia.  Musculoskeletal: Positive for back pain. Negative for arthralgias, gait problem, joint swelling, myalgias, neck pain and neck stiffness.  Skin: Negative.  Negative for rash.  Neurological: Negative.        Objective:   Physical Exam  Constitutional: She is oriented to person, place, and time. She appears well-developed and well-nourished. No distress.  HENT:  Head: Normocephalic and atraumatic.  Mouth/Throat: Oropharynx is clear and moist.  Neck: Normal range of motion. Neck supple.  Cardiovascular: Regular rhythm and intact distal pulses.  Tachycardia present.   Murmur (holosystolic)  heard. Pulmonary/Chest: Effort normal and breath sounds normal.  Abdominal: Soft. Bowel sounds are normal. She exhibits no distension and no mass. There is tenderness (Left quadrant pain with palpation, mild left CVA tenderness, pain on the left side with palpation on the right. ). There is guarding. There is no rebound.  Musculoskeletal: Normal range of motion.  Full ROM Left hip without tenderness, negative straight leg  Lymphadenopathy:    She has no cervical adenopathy.  Neurological: She is alert and oriented to person, place, and time.  Skin: Skin is warm and dry. No rash noted.        Assessment & Plan:   LLQ/Left flank pain, no rebound, some pain on the left with palpation on the right, stable vitals Treat out patient, get labs, if labs negative get CT AB  liquid diet/bland diet, continue fluids, nausea med, Tylenol #3 for pain If worse pain please go to the ER

## 2014-01-18 LAB — URINALYSIS, ROUTINE W REFLEX MICROSCOPIC
Bilirubin Urine: NEGATIVE
GLUCOSE, UA: NEGATIVE mg/dL
HGB URINE DIPSTICK: NEGATIVE
Ketones, ur: NEGATIVE mg/dL
Nitrite: NEGATIVE
PROTEIN: NEGATIVE mg/dL
Specific Gravity, Urine: 1.006 (ref 1.005–1.030)
UROBILINOGEN UA: 0.2 mg/dL (ref 0.0–1.0)
pH: 7.5 (ref 5.0–8.0)

## 2014-01-18 LAB — URINE CULTURE: Colony Count: 30000

## 2014-01-18 LAB — URINALYSIS, MICROSCOPIC ONLY
Bacteria, UA: NONE SEEN
Casts: NONE SEEN
Crystals: NONE SEEN
SQUAMOUS EPITHELIAL / LPF: NONE SEEN

## 2014-03-13 ENCOUNTER — Other Ambulatory Visit: Payer: Self-pay | Admitting: Obstetrics & Gynecology

## 2014-03-14 LAB — CYTOLOGY - PAP

## 2014-04-20 ENCOUNTER — Encounter: Payer: Self-pay | Admitting: Emergency Medicine

## 2014-05-17 ENCOUNTER — Encounter: Payer: Self-pay | Admitting: Emergency Medicine

## 2014-05-17 ENCOUNTER — Ambulatory Visit (INDEPENDENT_AMBULATORY_CARE_PROVIDER_SITE_OTHER): Payer: BLUE CROSS/BLUE SHIELD | Admitting: Emergency Medicine

## 2014-05-17 VITALS — BP 154/80 | HR 92 | Temp 97.8°F | Resp 16 | Ht 68.5 in | Wt 178.0 lb

## 2014-05-17 DIAGNOSIS — R6889 Other general symptoms and signs: Secondary | ICD-10-CM

## 2014-05-17 DIAGNOSIS — R7309 Other abnormal glucose: Secondary | ICD-10-CM

## 2014-05-17 DIAGNOSIS — Z0000 Encounter for general adult medical examination without abnormal findings: Secondary | ICD-10-CM

## 2014-05-17 DIAGNOSIS — I10 Essential (primary) hypertension: Secondary | ICD-10-CM

## 2014-05-17 DIAGNOSIS — Z79899 Other long term (current) drug therapy: Secondary | ICD-10-CM

## 2014-05-17 DIAGNOSIS — Z0001 Encounter for general adult medical examination with abnormal findings: Secondary | ICD-10-CM

## 2014-05-17 DIAGNOSIS — E559 Vitamin D deficiency, unspecified: Secondary | ICD-10-CM

## 2014-05-17 DIAGNOSIS — Z111 Encounter for screening for respiratory tuberculosis: Secondary | ICD-10-CM

## 2014-05-17 DIAGNOSIS — R350 Frequency of micturition: Secondary | ICD-10-CM

## 2014-05-17 LAB — CBC WITH DIFFERENTIAL/PLATELET
Basophils Absolute: 0 10*3/uL (ref 0.0–0.1)
Basophils Relative: 0 % (ref 0–1)
Eosinophils Absolute: 0 10*3/uL (ref 0.0–0.7)
Eosinophils Relative: 0 % (ref 0–5)
HCT: 40 % (ref 36.0–46.0)
Hemoglobin: 13.6 g/dL (ref 12.0–15.0)
Lymphocytes Relative: 14 % (ref 12–46)
Lymphs Abs: 1.4 10*3/uL (ref 0.7–4.0)
MCH: 29.5 pg (ref 26.0–34.0)
MCHC: 34 g/dL (ref 30.0–36.0)
MCV: 86.8 fL (ref 78.0–100.0)
MONO ABS: 0.8 10*3/uL (ref 0.1–1.0)
MONOS PCT: 8 % (ref 3–12)
MPV: 9.2 fL (ref 8.6–12.4)
NEUTROS ABS: 7.8 10*3/uL — AB (ref 1.7–7.7)
Neutrophils Relative %: 78 % — ABNORMAL HIGH (ref 43–77)
Platelets: 298 10*3/uL (ref 150–400)
RBC: 4.61 MIL/uL (ref 3.87–5.11)
RDW: 14.4 % (ref 11.5–15.5)
WBC: 10 10*3/uL (ref 4.0–10.5)

## 2014-05-17 LAB — HEPATIC FUNCTION PANEL
ALBUMIN: 4.6 g/dL (ref 3.5–5.2)
ALT: 41 U/L — ABNORMAL HIGH (ref 0–35)
AST: 31 U/L (ref 0–37)
Alkaline Phosphatase: 78 U/L (ref 39–117)
Bilirubin, Direct: 0.1 mg/dL (ref 0.0–0.3)
Indirect Bilirubin: 0.5 mg/dL (ref 0.2–1.2)
TOTAL PROTEIN: 7.1 g/dL (ref 6.0–8.3)
Total Bilirubin: 0.6 mg/dL (ref 0.2–1.2)

## 2014-05-17 LAB — BASIC METABOLIC PANEL WITH GFR
BUN: 12 mg/dL (ref 6–23)
CO2: 26 mEq/L (ref 19–32)
Calcium: 9.4 mg/dL (ref 8.4–10.5)
Chloride: 99 mEq/L (ref 96–112)
Creat: 0.53 mg/dL (ref 0.50–1.10)
GFR, Est Non African American: >89 mL/min
GLUCOSE: 88 mg/dL (ref 70–99)
POTASSIUM: 4.2 meq/L (ref 3.5–5.3)
Sodium: 137 mEq/L (ref 135–145)

## 2014-05-17 LAB — LIPID PANEL
CHOL/HDL RATIO: 2.5 ratio
Cholesterol: 197 mg/dL (ref 0–200)
HDL: 80 mg/dL (ref >39)
LDL Cholesterol: 101 mg/dL — ABNORMAL HIGH (ref 0–99)
Triglycerides: 79 mg/dL (ref <150)
VLDL: 16 mg/dL (ref 0–40)

## 2014-05-17 LAB — TSH: TSH: 2.488 u[IU]/mL (ref 0.350–4.500)

## 2014-05-17 LAB — MAGNESIUM: Magnesium: 1.9 mg/dL (ref 1.5–2.5)

## 2014-05-17 LAB — VITAMIN B12: Vitamin B-12: 546 pg/mL (ref 211–911)

## 2014-05-17 NOTE — Patient Instructions (Signed)

## 2014-05-18 LAB — URINALYSIS, ROUTINE W REFLEX MICROSCOPIC
BILIRUBIN URINE: NEGATIVE
Glucose, UA: NEGATIVE mg/dL
Hgb urine dipstick: NEGATIVE
Leukocytes, UA: NEGATIVE
Nitrite: NEGATIVE
Protein, ur: NEGATIVE mg/dL
Specific Gravity, Urine: 1.015 (ref 1.005–1.030)
UROBILINOGEN UA: 0.2 mg/dL (ref 0.0–1.0)
pH: 6.5 (ref 5.0–8.0)

## 2014-05-18 LAB — MICROALBUMIN / CREATININE URINE RATIO
CREATININE, URINE: 92.4 mg/dL
Microalb Creat Ratio: 13 mg/g (ref 0.0–30.0)
Microalb, Ur: 1.2 mg/dL (ref <2.0)

## 2014-05-18 LAB — HEMOGLOBIN A1C
Hgb A1c MFr Bld: 5.7 % — ABNORMAL HIGH (ref <5.7)
Mean Plasma Glucose: 117 mg/dL — ABNORMAL HIGH (ref <117)

## 2014-05-18 LAB — INSULIN, FASTING: INSULIN FASTING, SERUM: 5.1 u[IU]/mL (ref 2.0–19.6)

## 2014-05-18 LAB — FOLATE RBC: RBC FOLATE: 839 ng/mL (ref >280)

## 2014-05-18 LAB — VITAMIN D 25 HYDROXY (VIT D DEFICIENCY, FRACTURES): Vit D, 25-Hydroxy: 43 ng/mL (ref 30–100)

## 2014-05-19 LAB — TB SKIN TEST
Induration: 0 mm
TB SKIN TEST: NEGATIVE

## 2014-05-19 LAB — URINE CULTURE: Colony Count: 45000

## 2014-05-24 ENCOUNTER — Encounter: Payer: Self-pay | Admitting: Emergency Medicine

## 2014-05-24 NOTE — Progress Notes (Signed)
Subjective:    Patient ID: Bianca Fernandez, female    DOB: 22-Nov-1955, 59 y.o.   MRN: [id number]  HPI Comments: 59 yo WF CPE. She is doing well overall. She had started Amitriptyline for Sjogren's and esophageal difficulty which she has d/c due to weight gain. She notes the medicine did help with esophagus and hot flashes/ sweating. She has follow up next month with Rheumatology but denies any increased symptoms. She has not been exercising routinely but keeps busy and is not eating as healthy.  She has Cardiology f/u next month for ECHO recheck with stable scan at last visit. She notes Dr C is concerned because her murmur doe not seem"as loud". She denies any increased fatigue/ SOB/ edema.    Last CPE cholesterol/ A1c all WNL.     Medication List       This list is accurate as of: 05/17/14 11:59 PM.  Always use your most recent med list.               CALTRATE 600+D PO  Take by mouth 2 (two) times daily. Patient states 630 mg calcium and 500 IU Vitamin D     Magnesium 400 MG Tabs  Take by mouth daily.     pantoprazole 40 MG tablet  Commonly known as:  PROTONIX  Take 40 mg by mouth daily.     SUPER B COMPLEX PO  Take by mouth 2 (two) times daily.     Vitamin D 2000 UNITS tablet  Take 2,000 Units by mouth daily.       No Known Allergies   Past Medical History  Diagnosis Date  . GERD (gastroesophageal reflux disease)   . Sjogren's disease   . VSD (ventricular septal defect), perimembranous   . Anemia   . Osteopenia   . Unspecified vitamin D deficiency   . Migraines    Past Surgical History  Procedure Laterality Date  . Esophagogastroduodenoscopy  01/28/13    Dr Bosie Clos- dysmotility  . Dilation and curettage of uterus      x 2  . Ablation on endometriosis    . Cervical conization  1976  . Lip biopsy      + sjogrens   History  Substance Use Topics  . Smoking status: Never Smoker   . Smokeless tobacco: Not on file  . Alcohol Use: Yes     Comment: rare    Family History  Problem Relation Age of Onset  . Hypertension Mother   . Stroke Mother   . Hypertension Father   . Stroke Father   . Cancer Father     bladder  . Cancer Maternal Aunt     ovarian  . Cancer Maternal Grandmother     uterine  . Cancer Cousin 60    breast, deceased  . ALS Other   . Multiple sclerosis Cousin   . Parkinson's disease Cousin    MAINTENANCE: Colonoscopy:06/12/10 WNL EGD: 2014 over sensitive nerves Mammo:03/13/14 WNL @ Womens  BMD:07/24/2012 Osteopenia Stable Pap/ Pelvic:03/13/14 WNL @ gyn EYE:11/15 WNL Dentist:q 3-6 month 11/15 WNL DERM: 04/2014 WNL/ Stable RHEUM: 05/2014 ECHO: Stable repeat 06/2014   IMMUNIZATIONS: Tdap: 08/20/10 Pneumovax:04/19/2013 Zostavax:n/a Influenza: 02/2014  Patient Care Team: Lucky Cowboy, MD as PCP - General (Internal Medicine) Heather Burundi, OD (Optometry) Freddy Finner, MD as Consulting Physician (Obstetrics and Gynecology) Sherrian Divers, MD as Consulting Physician (Rheumatology) Shirley Friar, MD as Consulting Physician (Gastroenterology) Thurmon Fair, MD as Consulting Physician (Cardiology) Charlaine Dalton  Sherene SiresWert, MD as Consulting Physician (Pulmonary Disease) Thurmon FairMihai Croitoru, MD as Consulting Physician (Cardiology) Amy SwazilandJordan, MD as Consulting Physician (Dermatology)    Review of Systems  Respiratory: Negative for shortness of breath.   Cardiovascular: Negative for chest pain.  All other systems reviewed and are negative.  BP 154/80 mmHg  Pulse 92  Temp(Src) 97.8 F (36.6 C) (Temporal)  Resp 16  Ht 5' 8.5" (1.74 m)  Wt 178 lb (80.74 kg)  BMI 26.67 kg/m2     Objective:   Physical Exam  Constitutional: She is oriented to person, place, and time. She appears well-developed and well-nourished. No distress.  HENT:  Head: Normocephalic and atraumatic.  Right Ear: External ear normal.  Left Ear: External ear normal.  Nose: Nose normal.  Mouth/Throat: Oropharynx is clear and moist.  Eyes:  Conjunctivae and EOM are normal. Pupils are equal, round, and reactive to light. Right eye exhibits no discharge. Left eye exhibits no discharge. No scleral icterus.  Neck: Normal range of motion. Neck supple. No JVD present. No tracheal deviation present. No thyromegaly present.  Cardiovascular: Normal rate, regular rhythm and intact distal pulses.   Murmur heard. 3/6   Pulmonary/Chest: Effort normal and breath sounds normal.  Abdominal: Soft. Bowel sounds are normal. She exhibits no distension and no mass. There is no tenderness. There is no rebound and no guarding.  Genitourinary:  Def GYN  Musculoskeletal: Normal range of motion. She exhibits no edema or tenderness.  Lymphadenopathy:    She has no cervical adenopathy.  Neurological: She is alert and oriented to person, place, and time. She has normal reflexes. No cranial nerve deficit. She exhibits normal muscle tone. Coordination normal.  Skin: Skin is warm and dry. No rash noted. No erythema. No pallor.  DEF DERM  Psychiatric: She has a normal mood and affect. Her behavior is normal. Judgment and thought content normal.  Nursing note and vitals reviewed.     AORTA SCAN WNL EKG NSCSPT     Assessment & Plan:  1. CPE- Update screening labs/ History/ Immunizations/ Testing as needed. Advised healthy diet, QD exercise, increase H20 and continue RX/ Vitamins AD.   2. Urine frequency- Check labs,  push H2O Hygiene explained   3. VSD- Continue cardiology f/u AD

## 2014-08-29 ENCOUNTER — Ambulatory Visit: Payer: Self-pay | Admitting: Physician Assistant

## 2014-09-05 ENCOUNTER — Encounter: Payer: Self-pay | Admitting: *Deleted

## 2014-09-07 ENCOUNTER — Ambulatory Visit: Payer: Self-pay | Admitting: Physician Assistant

## 2014-09-12 ENCOUNTER — Ambulatory Visit (INDEPENDENT_AMBULATORY_CARE_PROVIDER_SITE_OTHER): Payer: BLUE CROSS/BLUE SHIELD | Admitting: Physician Assistant

## 2014-09-12 ENCOUNTER — Encounter: Payer: Self-pay | Admitting: Physician Assistant

## 2014-09-12 VITALS — BP 132/78 | HR 80 | Temp 97.9°F | Resp 16 | Ht 68.5 in | Wt 180.0 lb

## 2014-09-12 DIAGNOSIS — N8110 Cystocele, unspecified: Secondary | ICD-10-CM

## 2014-09-12 DIAGNOSIS — N816 Rectocele: Secondary | ICD-10-CM

## 2014-09-12 DIAGNOSIS — E559 Vitamin D deficiency, unspecified: Secondary | ICD-10-CM

## 2014-09-12 DIAGNOSIS — R7303 Prediabetes: Secondary | ICD-10-CM

## 2014-09-12 DIAGNOSIS — N814 Uterovaginal prolapse, unspecified: Secondary | ICD-10-CM | POA: Insufficient documentation

## 2014-09-12 DIAGNOSIS — R748 Abnormal levels of other serum enzymes: Secondary | ICD-10-CM

## 2014-09-12 DIAGNOSIS — IMO0002 Reserved for concepts with insufficient information to code with codable children: Secondary | ICD-10-CM

## 2014-09-12 DIAGNOSIS — E785 Hyperlipidemia, unspecified: Secondary | ICD-10-CM

## 2014-09-12 DIAGNOSIS — M3500 Sicca syndrome, unspecified: Secondary | ICD-10-CM

## 2014-09-12 DIAGNOSIS — I10 Essential (primary) hypertension: Secondary | ICD-10-CM | POA: Insufficient documentation

## 2014-09-12 DIAGNOSIS — R030 Elevated blood-pressure reading, without diagnosis of hypertension: Secondary | ICD-10-CM

## 2014-09-12 DIAGNOSIS — E871 Hypo-osmolality and hyponatremia: Secondary | ICD-10-CM

## 2014-09-12 DIAGNOSIS — Z79899 Other long term (current) drug therapy: Secondary | ICD-10-CM

## 2014-09-12 DIAGNOSIS — R7309 Other abnormal glucose: Secondary | ICD-10-CM | POA: Insufficient documentation

## 2014-09-12 LAB — CBC WITH DIFFERENTIAL/PLATELET
BASOS ABS: 0.1 10*3/uL (ref 0.0–0.1)
Basophils Relative: 1 % (ref 0–1)
Eosinophils Absolute: 0.1 10*3/uL (ref 0.0–0.7)
Eosinophils Relative: 1 % (ref 0–5)
HEMATOCRIT: 38.5 % (ref 36.0–46.0)
HEMOGLOBIN: 12.8 g/dL (ref 12.0–15.0)
LYMPHS PCT: 20 % (ref 12–46)
Lymphs Abs: 1.3 10*3/uL (ref 0.7–4.0)
MCH: 28.8 pg (ref 26.0–34.0)
MCHC: 33.2 g/dL (ref 30.0–36.0)
MCV: 86.7 fL (ref 78.0–100.0)
MONOS PCT: 7 % (ref 3–12)
MPV: 9.3 fL (ref 8.6–12.4)
Monocytes Absolute: 0.5 10*3/uL (ref 0.1–1.0)
NEUTROS ABS: 4.8 10*3/uL (ref 1.7–7.7)
Neutrophils Relative %: 71 % (ref 43–77)
PLATELETS: 275 10*3/uL (ref 150–400)
RBC: 4.44 MIL/uL (ref 3.87–5.11)
RDW: 14 % (ref 11.5–15.5)
WBC: 6.7 10*3/uL (ref 4.0–10.5)

## 2014-09-12 LAB — BASIC METABOLIC PANEL WITH GFR
BUN: 9 mg/dL (ref 6–23)
CO2: 27 meq/L (ref 19–32)
Calcium: 9.7 mg/dL (ref 8.4–10.5)
Chloride: 96 mEq/L (ref 96–112)
Creat: 0.64 mg/dL (ref 0.50–1.10)
GFR, Est Non African American: >89 mL/min
GLUCOSE: 94 mg/dL (ref 70–99)
Potassium: 4.5 mEq/L (ref 3.5–5.3)
SODIUM: 134 meq/L — AB (ref 135–145)

## 2014-09-12 LAB — HEPATIC FUNCTION PANEL
ALBUMIN: 4.5 g/dL (ref 3.5–5.2)
ALT: 24 U/L (ref 0–35)
AST: 23 U/L (ref 0–37)
Alkaline Phosphatase: 77 U/L (ref 39–117)
BILIRUBIN INDIRECT: 0.5 mg/dL (ref 0.2–1.2)
Bilirubin, Direct: 0.2 mg/dL (ref 0.0–0.3)
TOTAL PROTEIN: 7.1 g/dL (ref 6.0–8.3)
Total Bilirubin: 0.7 mg/dL (ref 0.2–1.2)

## 2014-09-12 LAB — LIPID PANEL
CHOL/HDL RATIO: 2.2 ratio
Cholesterol: 163 mg/dL (ref 0–200)
HDL: 75 mg/dL (ref >=46)
LDL Cholesterol: 78 mg/dL (ref 0–99)
Triglycerides: 50 mg/dL (ref <150)
VLDL: 10 mg/dL (ref 0–40)

## 2014-09-12 LAB — TSH: TSH: 3.93 u[IU]/mL (ref 0.350–4.500)

## 2014-09-12 LAB — MAGNESIUM: Magnesium: 1.9 mg/dL (ref 1.5–2.5)

## 2014-09-12 NOTE — Patient Instructions (Signed)

## 2014-09-12 NOTE — Progress Notes (Signed)
Assessment and Plan:  1. Hypertension -Continue medication, monitor blood pressure at home. Continue DASH diet.  Reminder to go to the ER if any CP, SOB, nausea, dizziness, severe HA, changes vision/speech, left arm numbness and tingling and jaw pain.  2. Cholesterol -Continue diet and exercise. Check cholesterol.   3. Prediabetes  -Continue diet and exercise. Check A1C  4. Vitamin D Def - check level and continue medications.  5. Elevated LFTs Check labs, avoid tylenol, alcohol, weight loss advised.    Continue diet and meds as discussed. Further disposition pending results of labs. Over 30 minutes of exam, counseling, chart review, and critical decision making was performed  HPI 59 y.o. female  presents for 3 month follow up on hypertension, cholesterol, prediabetes, and vitamin D deficiency.   Her blood pressure has been controlled at home, today their BP is BP: 132/78 mmHg  She does not workout. She denies chest pain, shortness of breath, dizziness.  She is not on cholesterol medication and denies myalgias. Her cholesterol is at goal. The cholesterol last visit was:   Lab Results  Component Value Date   CHOL 197 05/17/2014   HDL 80 05/17/2014   LDLCALC 101* 05/17/2014   TRIG 79 05/17/2014   CHOLHDL 2.5 05/17/2014    She has been working on diet and exercise for prediabetes, and denies paresthesia of the feet, polydipsia, polyuria and visual disturbances. Last A1C in the office was:  Lab Results  Component Value Date   HGBA1C 5.7* 05/17/2014   Patient is on Vitamin D supplement.   Lab Results  Component Value Date   VD25OH 43 05/17/2014     Has cyctocele and rectocele, being followed by OB/GYN and is now on vagifem.  She also had elevated LFTs.  Lab Results  Component Value Date   ALT 41* 05/17/2014   AST 31 05/17/2014   ALKPHOS 78 05/17/2014   BILITOT 0.6 05/17/2014   Current Medications:  Current Outpatient Prescriptions on File Prior to Visit  Medication Sig  Dispense Refill  . B Complex-C (SUPER B COMPLEX PO) Take by mouth 2 (two) times daily.     . Calcium Carbonate-Vitamin D (CALTRATE 600+D PO) Take by mouth 2 (two) times daily. Patient states 630 mg calcium and 500 IU Vitamin D    . Cholecalciferol (VITAMIN D) 2000 UNITS tablet Take 2,000 Units by mouth daily.    . Magnesium 400 MG TABS Take by mouth daily.     No current facility-administered medications on file prior to visit.   Medical History:  Past Medical History  Diagnosis Date  . GERD (gastroesophageal reflux disease)   . Sjogren's disease   . VSD (ventricular septal defect), perimembranous   . Anemia   . Osteopenia   . Unspecified vitamin D deficiency   . Migraines    Allergies: No Known Allergies   Review of Systems:  Review of Systems  Constitutional: Negative.   HENT: Negative.   Eyes: Negative.   Respiratory: Negative.   Cardiovascular: Negative.   Gastrointestinal: Positive for diarrhea. Negative for heartburn, nausea, vomiting, abdominal pain, constipation, blood in stool and melena.  Genitourinary: Positive for frequency. Negative for dysuria, urgency, hematuria and flank pain.  Musculoskeletal: Negative.   Skin: Negative.   Neurological: Negative.   Endo/Heme/Allergies: Negative.   Psychiatric/Behavioral: Negative.     Family history- Review and unchanged Social history- Review and unchanged Physical Exam: BP 132/78 mmHg  Pulse 80  Temp(Src) 97.9 F (36.6 C)  Resp 16  Ht  5' 8.5" (1.74 m)  Wt 180 lb (81.647 kg)  BMI 26.97 kg/m2 Wt Readings from Last 3 Encounters:  09/12/14 180 lb (81.647 kg)  05/17/14 178 lb (80.74 kg)  01/17/14 178 lb (80.74 kg)   General Appearance: Well nourished, in no apparent distress. Eyes: PERRLA, EOMs, conjunctiva no swelling or erythema Sinuses: No Frontal/maxillary tenderness ENT/Mouth: Ext aud canals clear, TMs without erythema, bulging. No erythema, swelling, or exudate on post pharynx.  Tonsils not swollen or  erythematous. Hearing normal.  Neck: Supple, thyroid normal.  Respiratory: Respiratory effort normal, BS equal bilaterally without rales, rhonchi, wheezing or stridor.  Cardio: RRR with holosystolic machine murmur,  Brisk peripheral pulses without edema.  Abdomen: Soft, + BS,  Non tender, no guarding, rebound, hernias, masses. Lymphatics: Non tender without lymphadenopathy.  Musculoskeletal: Full ROM, 5/5 strength, Normal gait Skin: Warm, dry without rashes, lesions, ecchymosis.  Neuro: Cranial nerves intact. Normal muscle tone, no cerebellar symptoms. Psych: Awake and oriented X 3, normal affect, Insight and Judgment appropriate.    Quentin Mullingollier, Lupe Bonner, PA-C 9:38 AM [facility]

## 2014-09-13 LAB — HEMOGLOBIN A1C
Hgb A1c MFr Bld: 5.8 % — ABNORMAL HIGH (ref <5.7)
Mean Plasma Glucose: 120 mg/dL — ABNORMAL HIGH (ref <117)

## 2014-09-13 LAB — INSULIN, FASTING: INSULIN FASTING, SERUM: 3.8 u[IU]/mL (ref 2.0–19.6)

## 2014-09-13 LAB — VITAMIN D 25 HYDROXY (VIT D DEFICIENCY, FRACTURES): VIT D 25 HYDROXY: 41 ng/mL (ref 30–100)

## 2014-09-21 ENCOUNTER — Ambulatory Visit: Payer: Self-pay | Admitting: Cardiovascular Disease

## 2014-09-28 ENCOUNTER — Encounter: Payer: Self-pay | Admitting: Cardiovascular Disease

## 2014-10-02 ENCOUNTER — Encounter: Payer: Self-pay | Admitting: Cardiovascular Disease

## 2014-10-08 ENCOUNTER — Encounter: Payer: Self-pay | Admitting: *Deleted

## 2014-10-27 ENCOUNTER — Ambulatory Visit: Payer: Self-pay | Admitting: Cardiovascular Disease

## 2014-11-15 ENCOUNTER — Ambulatory Visit: Payer: Self-pay | Admitting: Cardiovascular Disease

## 2015-01-24 ENCOUNTER — Ambulatory Visit (INDEPENDENT_AMBULATORY_CARE_PROVIDER_SITE_OTHER): Payer: BLUE CROSS/BLUE SHIELD | Admitting: Cardiovascular Disease

## 2015-01-24 ENCOUNTER — Encounter: Payer: Self-pay | Admitting: Cardiovascular Disease

## 2015-01-24 VITALS — BP 140/72 | HR 97 | Ht 68.5 in | Wt 177.2 lb

## 2015-01-24 DIAGNOSIS — Q210 Ventricular septal defect: Secondary | ICD-10-CM

## 2015-01-24 NOTE — Progress Notes (Signed)
Patient ID: HIYA POINT, female   DOB: 08-19-1955, 59 y.o.   MRN: [id number]      Cardiology Office Note   Date:  01/24/2015   ID:  Onica, Davidovich 1956/04/22, MRN [id number]  PCP:  Nadean Corwin, MD  Cardiologist:   Thurmon Fair, MD   Chief Complaint  Patient presents with  . yearly exam    no chest pain, SOB or leg swelling      History of Present Illness: Bianca Fernandez is a 59 y.o. female who presents for  Follow-up for ventricular septal defect and Sjogren's disease.   She has no complaints of dyspnea or angina , palpitations or lower extremity edema. It has been 5 years since her last echocardiogram. She is known to have a small perimembranous VSD but to date there has been no evidence of left ventricular dysfunction, right heart enlargement, pulmonary hypertension.    Past Medical History  Diagnosis Date  . GERD (gastroesophageal reflux disease)   . Sjogren's disease   . VSD (ventricular septal defect), perimembranous   . Anemia   . Osteopenia   . Unspecified vitamin D deficiency   . Migraines     Past Surgical History  Procedure Laterality Date  . Esophagogastroduodenoscopy  01/28/13    Dr Bosie Clos- dysmotility  . Dilation and curettage of uterus      x 2  . Ablation on endometriosis    . Cervical conization  1976  . Lip biopsy      + sjogrens     Current Outpatient Prescriptions  Medication Sig Dispense Refill  . B Complex-C (SUPER B COMPLEX PO) Take by mouth 2 (two) times daily.     . Calcium Carbonate-Vitamin D (CALTRATE 600+D PO) Take by mouth 2 (two) times daily. Patient states 630 mg calcium and 500 IU Vitamin D    . Cholecalciferol (VITAMIN D) 2000 UNITS tablet Take 2,000 Units by mouth daily.    . Estradiol (VAGIFEM VA) Place vaginally. Insert vaginally twice weekly    . Magnesium 400 MG TABS Take by mouth daily.    . Omega 3-6-9 Fatty Acids (OMEGA 3-6-9 COMPLEX PO) Take 1 tablet by mouth daily.     No current facility-administered  medications for this visit.    Allergies:   Review of patient's allergies indicates no known allergies.    Social History:  The patient  reports that she has never smoked. She does not have any smokeless tobacco history on file. She reports that she drinks alcohol. She reports that she does not use illicit drugs.   Family History:  The patient's family history includes ALS in her other; Bladder Cancer in her father; Breast cancer in her cousin; Hypertension in her father and mother; Multiple sclerosis in her cousin; Ovarian cancer in her maternal aunt; Parkinson's disease in her cousin; Stroke in her father and mother; Uterine cancer in her maternal grandmother.    ROS:  Please see the history of present illness.    Otherwise, review of systems positive for none.   All other systems are reviewed and negative.    PHYSICAL EXAM: VS:  BP 140/72 mmHg  Pulse 97  Ht 5' 8.5" (1.74 m)  Wt 177 lb 3.2 oz (80.377 kg)  BMI 26.55 kg/m2 , BMI Body mass index is 26.55 kg/(m^2).  General: Alert, oriented x3, no distress Head: no evidence of trauma, PERRL, EOMI, no exophtalmos or lid lag, no myxedema, no xanthelasma; normal ears, nose and oropharynx Neck: normal jugular  venous pulsations and no hepatojugular reflux; brisk carotid pulses without delay and no carotid bruits Chest: clear to auscultation, no signs of consolidation by percussion or palpation, normal fremitus, symmetrical and full respiratory excursions Cardiovascular: normal position and quality of the apical impulse, regular rhythm, normal first and second heart sounds, 3/6  Harsh holosystolic murmur heard widely throughout the precordium , no diastolic murmurs, rubs or gallops Abdomen: no tenderness or distention, no masses by palpation, no abnormal pulsatility or arterial bruits, normal bowel sounds, no hepatosplenomegaly Extremities: no clubbing, cyanosis or edema; 2+ radial, ulnar and brachial pulses bilaterally; 2+ right femoral,  posterior tibial and dorsalis pedis pulses; 2+ left femoral, posterior tibial and dorsalis pedis pulses; no subclavian or femoral bruits Neurological: grossly nonfocal Psych: euthymic mood, full affect   EKG:  EKG is ordered today. The ekg ordered today demonstrates  Normal sinus rhythm and the suggestion of right atrial enlargement   Recent Labs: 09/12/2014: ALT 24; BUN 9; Creat 0.64; Hemoglobin 12.8; Magnesium 1.9; Platelets 275; Potassium 4.5; Sodium 134*; TSH 3.930    Lipid Panel    Component Value Date/Time   CHOL 163 09/12/2014 1015   TRIG 50 09/12/2014 1015   HDL 75 09/12/2014 1015   CHOLHDL 2.2 09/12/2014 1015   VLDL 10 09/12/2014 1015   LDLCALC 78 09/12/2014 1015      Wt Readings from Last 3 Encounters:  01/24/15 177 lb 3.2 oz (80.377 kg)  09/12/14 180 lb (81.647 kg)  05/17/14 178 lb (80.74 kg)      ASSESSMENT AND PLAN:  1.  Asymptomatic perimembranous VSD. Although clinically there is no evidence of right heart enlargement, early echocardiogram shows a particularly prominent P pulmonale , suggesting that there may be pulmonary hypertension. Will repeat an echocardiogram (one has not been performed since 2011).  Discussed the relaxed guidelines regarding endocarditis prophylaxis for her particular condition. She does not have any graft material or other prostheses. Excellent recent lipid profile. Normal blood pressure.  She has lost low bit of weight since last year , borderline overweight only    Current medicines are reviewed at length with the patient today.  The patient does not have concerns regarding medicines.  The following changes have been made:  no change  Labs/ tests ordered today include:  No orders of the defined types were placed in this encounter.     Patient Instructions  Your physician wants you to follow-up in: 1 year or sooner if needed with Dr. Leonard Downing will receive a reminder letter in the mail two months in advance. If you don't  receive a letter, please call our office to schedule the follow-up appointment.  Your physician has requested that you have an echocardiogram. Echocardiography is a painless test that uses sound waves to create images of your heart. It provides your doctor with information about the size and shape of your heart and how well your heart's chambers and valves are working. This procedure takes approximately one hour. There are no restrictions for this procedure.      Joie Bimler, MD  01/24/2015 3:51 PM    Thurmon Fair, MD, [facility] [phone] office [phone] pager

## 2015-01-24 NOTE — Patient Instructions (Signed)
Your physician wants you to follow-up in: 1 year or sooner if needed with Dr. Leonard Downing will receive a reminder letter in the mail two months in advance. If you don't receive a letter, please call our office to schedule the follow-up appointment.  Your physician has requested that you have an echocardiogram. Echocardiography is a painless test that uses sound waves to create images of your heart. It provides your doctor with information about the size and shape of your heart and how well your heart's chambers and valves are working. This procedure takes approximately one hour. There are no restrictions for this procedure.

## 2015-01-31 ENCOUNTER — Other Ambulatory Visit: Payer: Self-pay

## 2015-01-31 ENCOUNTER — Ambulatory Visit (HOSPITAL_COMMUNITY): Payer: BLUE CROSS/BLUE SHIELD | Attending: Internal Medicine

## 2015-01-31 DIAGNOSIS — Q210 Ventricular septal defect: Secondary | ICD-10-CM | POA: Insufficient documentation

## 2015-01-31 DIAGNOSIS — I071 Rheumatic tricuspid insufficiency: Secondary | ICD-10-CM | POA: Diagnosis not present

## 2015-01-31 DIAGNOSIS — I5189 Other ill-defined heart diseases: Secondary | ICD-10-CM | POA: Diagnosis not present

## 2015-01-31 DIAGNOSIS — I340 Nonrheumatic mitral (valve) insufficiency: Secondary | ICD-10-CM | POA: Diagnosis not present

## 2015-02-15 ENCOUNTER — Ambulatory Visit: Payer: Self-pay | Admitting: Cardiovascular Disease

## 2015-03-15 ENCOUNTER — Ambulatory Visit: Payer: Self-pay | Admitting: Physician Assistant

## 2015-05-21 ENCOUNTER — Encounter: Payer: Self-pay | Admitting: Internal Medicine

## 2015-05-24 ENCOUNTER — Encounter: Payer: Self-pay | Admitting: Internal Medicine

## 2015-05-24 ENCOUNTER — Ambulatory Visit (INDEPENDENT_AMBULATORY_CARE_PROVIDER_SITE_OTHER): Payer: BLUE CROSS/BLUE SHIELD | Admitting: Internal Medicine

## 2015-05-24 VITALS — BP 158/76 | HR 98 | Temp 98.0°F | Resp 16 | Ht 68.0 in | Wt 176.0 lb

## 2015-05-24 DIAGNOSIS — R7303 Prediabetes: Secondary | ICD-10-CM

## 2015-05-24 DIAGNOSIS — E559 Vitamin D deficiency, unspecified: Secondary | ICD-10-CM

## 2015-05-24 DIAGNOSIS — R5383 Other fatigue: Secondary | ICD-10-CM

## 2015-05-24 DIAGNOSIS — R030 Elevated blood-pressure reading, without diagnosis of hypertension: Secondary | ICD-10-CM

## 2015-05-24 DIAGNOSIS — Z0000 Encounter for general adult medical examination without abnormal findings: Secondary | ICD-10-CM | POA: Diagnosis not present

## 2015-05-24 DIAGNOSIS — Z0001 Encounter for general adult medical examination with abnormal findings: Secondary | ICD-10-CM

## 2015-05-24 DIAGNOSIS — I10 Essential (primary) hypertension: Secondary | ICD-10-CM

## 2015-05-24 DIAGNOSIS — E785 Hyperlipidemia, unspecified: Secondary | ICD-10-CM

## 2015-05-24 DIAGNOSIS — Z79899 Other long term (current) drug therapy: Secondary | ICD-10-CM | POA: Diagnosis not present

## 2015-05-24 DIAGNOSIS — Z130 Encounter for screening for diseases of the blood and blood-forming organs and certain disorders involving the immune mechanism: Secondary | ICD-10-CM

## 2015-05-24 DIAGNOSIS — R7309 Other abnormal glucose: Secondary | ICD-10-CM | POA: Diagnosis not present

## 2015-05-24 LAB — CBC WITH DIFFERENTIAL/PLATELET
BASOS ABS: 0 10*3/uL (ref 0.0–0.1)
Basophils Relative: 0 % (ref 0–1)
EOS ABS: 0.1 10*3/uL (ref 0.0–0.7)
Eosinophils Relative: 1 % (ref 0–5)
HEMATOCRIT: 38.5 % (ref 36.0–46.0)
HEMOGLOBIN: 12.9 g/dL (ref 12.0–15.0)
LYMPHS ABS: 2 10*3/uL (ref 0.7–4.0)
LYMPHS PCT: 20 % (ref 12–46)
MCH: 29.5 pg (ref 26.0–34.0)
MCHC: 33.5 g/dL (ref 30.0–36.0)
MCV: 87.9 fL (ref 78.0–100.0)
MONOS PCT: 6 % (ref 3–12)
MPV: 9.4 fL (ref 8.6–12.4)
Monocytes Absolute: 0.6 10*3/uL (ref 0.1–1.0)
NEUTROS ABS: 7.3 10*3/uL (ref 1.7–7.7)
NEUTROS PCT: 73 % (ref 43–77)
PLATELETS: 289 10*3/uL (ref 150–400)
RBC: 4.38 MIL/uL (ref 3.87–5.11)
RDW: 13.9 % (ref 11.5–15.5)
WBC: 10 10*3/uL (ref 4.0–10.5)

## 2015-05-24 LAB — BASIC METABOLIC PANEL WITH GFR
BUN: 7 mg/dL (ref 7–25)
CHLORIDE: 98 mmol/L (ref 98–110)
CO2: 29 mmol/L (ref 20–31)
CREATININE: 0.57 mg/dL (ref 0.50–0.99)
Calcium: 9.3 mg/dL (ref 8.6–10.4)
GFR, Est African American: >89 mL/min (ref >=60)
GFR, Est Non African American: >89 mL/min (ref >=60)
GLUCOSE: 87 mg/dL (ref 65–99)
POTASSIUM: 3.9 mmol/L (ref 3.5–5.3)
Sodium: 136 mmol/L (ref 135–146)

## 2015-05-24 LAB — LIPID PANEL
CHOL/HDL RATIO: 2.2 ratio (ref <=5.0)
Cholesterol: 169 mg/dL (ref 125–200)
HDL: 76 mg/dL (ref >=46)
LDL CALC: 82 mg/dL (ref <130)
Triglycerides: 55 mg/dL (ref <150)
VLDL: 11 mg/dL (ref <30)

## 2015-05-24 LAB — HEPATIC FUNCTION PANEL
ALBUMIN: 4.6 g/dL (ref 3.6–5.1)
ALK PHOS: 72 U/L (ref 33–130)
ALT: 19 U/L (ref 6–29)
AST: 21 U/L (ref 10–35)
BILIRUBIN TOTAL: 0.7 mg/dL (ref 0.2–1.2)
Bilirubin, Direct: 0.2 mg/dL (ref <=0.2)
Indirect Bilirubin: 0.5 mg/dL (ref 0.2–1.2)
Total Protein: 7 g/dL (ref 6.1–8.1)

## 2015-05-24 LAB — MAGNESIUM: MAGNESIUM: 1.9 mg/dL (ref 1.5–2.5)

## 2015-05-24 LAB — IRON AND TIBC
%SAT: 26 % (ref 11–50)
IRON: 82 ug/dL (ref 45–160)
TIBC: 313 ug/dL (ref 250–450)
UIBC: 231 ug/dL (ref 125–400)

## 2015-05-24 NOTE — Progress Notes (Signed)
Patient ID: Bianca Fernandez, female   DOB: 1955-09-26, 60 y.o.   MRN: [id number]  Complete Physical  Assessment and Plan:  1. Encounter for general adult medical examination with abnormal findings   2. Elevated blood pressure reading without diagnosis of hypertension  - Urinalysis, Routine w reflex microscopic (not at [facility]) - Microalbumin / creatinine urine ratio - TSH  3. Hyperlipidemia  - Lipid panel  4. Prediabetes  - Hemoglobin A1c - Insulin, random  5. Vitamin D deficiency  - VITAMIN D 25 Hydroxy (Vit-D Deficiency, Fractures)  6. Medication management  - CBC with Differential/Platelet - BASIC METABOLIC PANEL WITH GFR - Hepatic function panel - Magnesium  7. Screening, deficiency anemia, iron  - Iron and TIBC - Vitamin B12    Discussed med's effects and SE's. Screening labs and tests as requested with regular follow-up as recommended.  HPI  60 y.o. female  presents for a complete physical.  Her blood pressure has been controlled at home, today their BP is BP: (!) 158/76 mmHg.  She does workout. She denies chest pain, shortness of breath, dizziness.   She is on cholesterol medication and denies myalgias. Her cholesterol is at goal. The cholesterol last visit was:  Lab Results  Component Value Date   CHOL 163 09/12/2014   HDL 75 09/12/2014   LDLCALC 78 09/12/2014   TRIG 50 09/12/2014   CHOLHDL 2.2 09/12/2014  .  She has been working on diet and exercise for prediabetes, she is on bASA, she is not on ACE/ARB and denies foot ulcerations, hyperglycemia, hypoglycemia , increased appetite, nausea, paresthesia of the feet, polydipsia, polyuria, visual disturbances, vomiting and weight loss. Last A1C in the office was:  Lab Results  Component Value Date   HGBA1C 5.8* 09/12/2014    Patient is on Vitamin D supplement.   Lab Results  Component Value Date   VD25OH 41 09/12/2014     She is following with an Obgyn and they are taking care of her mammograms and  also with her pap smears.  She reports that she is due to visit them in March.  She reports that her sjogrens disease keeps her tired.    Current Medications:  Current Outpatient Prescriptions on File Prior to Visit  Medication Sig Dispense Refill  . B Complex-C (SUPER B COMPLEX PO) Take by mouth 2 (two) times daily.     . Calcium Carbonate-Vitamin D (CALTRATE 600+D PO) Take by mouth 2 (two) times daily. Patient states 630 mg calcium and 500 IU Vitamin D    . Cholecalciferol (VITAMIN D) 2000 UNITS tablet Take 2,000 Units by mouth daily.    . Estradiol (VAGIFEM VA) Place vaginally. Insert vaginally twice weekly    . Magnesium 400 MG TABS Take by mouth daily.    . Omega 3-6-9 Fatty Acids (OMEGA 3-6-9 COMPLEX PO) Take 1 tablet by mouth daily.     No current facility-administered medications on file prior to visit.    Health Maintenance:   Immunization History  Administered Date(s) Administered  . Influenza-Unspecified 01/25/2013, 02/09/2014  . PPD Test 04/19/2013, 05/17/2014  . Pneumococcal Polysaccharide-23 04/19/2013  . Tdap 08/20/2010    Tetanus: 2012 Pneumovax: 2014 Flu vaccine: declined this year MGM: Nov 2015  DEXA: 2014 Colonoscopy: 2012 Last Dental Exam: Dr. Melvyn Neth, twice yearly visits Last Eye Exam:  Dr. [place], yearly visits last done in Dec 2016  Patient Care Team: Lucky Cowboy, MD as PCP - General (Internal Medicine) Heather [place], OD (Optometry) Freddy Finner,  MD as Consulting Physician (Obstetrics and Gynecology) Lurena Nida, MD as Consulting Physician (Rheumatology) Charlott Rakes, MD as Consulting Physician (Gastroenterology) Thurmon Fair, MD as Consulting Physician (Cardiology) Nyoka Cowden, MD as Consulting Physician (Pulmonary Disease) Thurmon Fair, MD as Consulting Physician (Cardiology) Amy [place], MD as Consulting Physician (Dermatology)  Allergies: No Known Allergies  Medical History:  Past Medical History  Diagnosis Date  . GERD  (gastroesophageal reflux disease)   . Sjogren's disease (HCC)   . VSD (ventricular septal defect), perimembranous   . Anemia   . Osteopenia   . Unspecified vitamin D deficiency   . Migraines     Surgical History:  Past Surgical History  Procedure Laterality Date  . Esophagogastroduodenoscopy  01/28/13    Dr Bosie Clos- dysmotility  . Dilation and curettage of uterus      x 2  . Ablation on endometriosis    . Cervical conization  1976  . Lip biopsy      + sjogrens    Family History:  Family History  Problem Relation Age of Onset  . Hypertension Mother   . Stroke Mother   . Hypertension Father   . Stroke Father   . Bladder Cancer Father   . Ovarian cancer Maternal Aunt   . Uterine cancer Maternal Grandmother   . ALS Other   . Multiple sclerosis Cousin   . Parkinson's disease Cousin   . Breast cancer Cousin     Social History:  Social History  Substance Use Topics  . Smoking status: Never Smoker   . Smokeless tobacco: None  . Alcohol Use: Yes     Comment: rare    Review of Systems: Review of Systems  Constitutional: Negative for fever, chills and malaise/fatigue.  HENT: Negative for congestion, ear pain and sore throat.   Eyes: Negative.   Respiratory: Negative for cough, shortness of breath and wheezing.   Cardiovascular: Negative for chest pain, palpitations and leg swelling.  Gastrointestinal: Positive for heartburn and diarrhea. Negative for nausea, vomiting, constipation, blood in stool and melena.  Genitourinary: Negative.   Skin: Negative.   Neurological: Negative for dizziness, sensory change, loss of consciousness and headaches.  Psychiatric/Behavioral: Negative for depression. The patient has insomnia. The patient is not nervous/anxious.     Physical Exam: Estimated body mass index is 26.77 kg/(m^2) as calculated from the following:   Height as of this encounter: 5\' 8"  (1.727 m).   Weight as of this encounter: 176 lb (79.833 kg). BP 158/76 mmHg   Pulse 98  Temp(Src) 98 F (36.7 C) (Temporal)  Resp 16  Ht 5\' 8"  (1.727 m)  Wt 176 lb (79.833 kg)  BMI 26.77 kg/m2  General Appearance: Well nourished well developed, in no apparent distress.  Eyes: PERRLA, EOMs, conjunctiva no swelling or erythema ENT/Mouth: Ear canals normal without obstruction, swelling, erythema, or discharge.  TMs normal bilaterally with no erythema, bulging, retraction, or loss of landmark.  Oropharynx moist and clear with no exudate, erythema, or swelling.   Neck: Supple, thyroid normal. No bruits.  No cervical adenopathy Respiratory: Respiratory effort normal, Breath sounds clear A&P without wheeze, rhonchi, rales.  Cardio: RR with 3+ blowing holosystolic murmur, rubs or gallops. Brisk peripheral pulses without edema.  Chest: symmetric, with normal excursions Abdomen: Soft, nontender, no guarding, rebound, hernias, masses, or organomegaly.  Lymphatics: Non tender without lymphadenopathy.  Musculoskeletal: Full ROM all peripheral extremities,5/5 strength, and normal gait.  Skin: Warm, dry without rashes, lesions, ecchymosis. Neuro: Awake and oriented X 3, Cranial  nerves intact, reflexes equal bilaterally. Normal muscle tone, no cerebellar symptoms. Sensation intact.  Psych:  normal affect, Insight and Judgment appropriate.   Over 40 minutes of exam, counseling, chart review and critical decision making was performed  Toni Amendourtney Forcucci 2:10 PM [facility]

## 2015-05-24 NOTE — Patient Instructions (Signed)
Preventive Care for Adults  A healthy lifestyle and preventive care can promote health and wellness. Preventive health guidelines for women include the following key practices.  A routine yearly physical is a good way to check with your health care provider about your health and preventive screening. It is a chance to share any concerns and updates on your health and to receive a thorough exam.  Visit your dentist for a routine exam and preventive care every 6 months. Brush your teeth twice a day and floss once a day. Good oral hygiene prevents tooth decay and gum disease.  The frequency of eye exams is based on your age, health, family medical history, use of contact lenses, and other factors. Follow your health care provider's recommendations for frequency of eye exams.  Eat a healthy diet. Foods like vegetables, fruits, whole grains, low-fat dairy products, and lean protein foods contain the nutrients you need without too many calories. Decrease your intake of foods high in solid fats, added sugars, and salt. Eat the right amount of calories for you.Get information about a proper diet from your health care provider, if necessary.  Regular physical exercise is one of the most important things you can do for your health. Most adults should get at least 150 minutes of moderate-intensity exercise (any activity that increases your heart rate and causes you to sweat) each week. In addition, most adults need muscle-strengthening exercises on 2 or more days a week.  Maintain a healthy weight. The body mass index (BMI) is a screening tool to identify possible weight problems. It provides an estimate of body fat based on height and weight. Your health care provider can find your BMI and can help you achieve or maintain a healthy weight.For adults 20 years and older:  A BMI below 18.5 is considered underweight.  A BMI of 18.5 to 24.9 is normal.  A BMI of 25 to 29.9 is considered overweight.  A BMI  of 30 and above is considered obese.  Maintain normal blood lipids and cholesterol levels by exercising and minimizing your intake of saturated fat. Eat a balanced diet with plenty of fruit and vegetables. If your lipid or cholesterol levels are high, you are over 50, or you are at high risk for heart disease, you may need your cholesterol levels checked more frequently.Ongoing high lipid and cholesterol levels should be treated with medicines if diet and exercise are not working.  If you smoke, find out from your health care provider how to quit. If you do not use tobacco, do not start.  Lung cancer screening is recommended for adults aged 2-80 years who are at high risk for developing lung cancer because of a history of smoking. A yearly low-dose CT scan of the lungs is recommended for people who have at least a 30-pack-year history of smoking and are a current smoker or have quit within the past 15 years. A pack year of smoking is smoking an average of 1 pack of cigarettes a day for 1 year (for example: 1 pack a day for 30 years or 2 packs a day for 15 years). Yearly screening should continue until the smoker has stopped smoking for at least 15 years. Yearly screening should be stopped for people who develop a health problem that would prevent them from having lung cancer treatment.  Avoid use of street drugs. Do not share needles with anyone. Ask for help if you need support or instructions about stopping the use of drugs.  High  blood pressure causes heart disease and increases the risk of stroke.  Ongoing high blood pressure should be treated with medicines if weight loss and exercise do not work.  If you are 55-79 years old, ask your health care provider if you should take aspirin to prevent strokes.  Diabetes screening involves taking a blood sample to check your fasting blood sugar level. This should be done once every 3 years, after age 45, if you are within normal weight and without risk  factors for diabetes. Testing should be considered at a younger age or be carried out more frequently if you are overweight and have at least 1 risk factor for diabetes.  Breast cancer screening is essential preventive care for women. You should practice "breast self-awareness." This means understanding the normal appearance and feel of your breasts and may include breast self-examination. Any changes detected, no matter how small, should be reported to a health care provider. Women in their 20s and 30s should have a clinical breast exam (CBE) by a health care provider as part of a regular health exam every 1 to 3 years. After age 40, women should have a CBE every year. Starting at age 40, women should consider having a mammogram (breast X-ray test) every year. Women who have a family history of breast cancer should talk to their health care provider about genetic screening. Women at a high risk of breast cancer should talk to their health care providers about having an MRI and a mammogram every year.  Breast cancer gene (BRCA)-related cancer risk assessment is recommended for women who have family members with BRCA-related cancers. BRCA-related cancers include breast, ovarian, tubal, and peritoneal cancers. Having family members with these cancers may be associated with an increased risk for harmful changes (mutations) in the breast cancer genes BRCA1 and BRCA2. Results of the assessment will determine the need for genetic counseling and BRCA1 and BRCA2 testing.  Routine pelvic exams to screen for cancer are no longer recommended for nonpregnant women who are considered low risk for cancer of the pelvic organs (ovaries, uterus, and vagina) and who do not have symptoms. Ask your health care provider if a screening pelvic exam is right for you.  If you have had past treatment for cervical cancer or a condition that could lead to cancer, you need Pap tests and screening for cancer for at least 20 years after  your treatment. If Pap tests have been discontinued, your risk factors (such as having a new sexual partner) need to be reassessed to determine if screening should be resumed. Some women have medical problems that increase the chance of getting cervical cancer. In these cases, your health care provider may recommend more frequent screening and Pap tests.    Colorectal cancer can be detected and often prevented. Most routine colorectal cancer screening begins at the age of 50 years and continues through age 75 years. However, your health care provider may recommend screening at an earlier age if you have risk factors for colon cancer. On a yearly basis, your health care provider may provide home test kits to check for hidden blood in the stool. Use of a small camera at the end of a tube, to directly examine the colon (sigmoidoscopy or colonoscopy), can detect the earliest forms of colorectal cancer. Talk to your health care provider about this at age 50, when routine screening begins. Direct exam of the colon should be repeated every 5-10 years through age 75 years, unless early forms of pre-cancerous   polyps or small growths are found.  Osteoporosis is a disease in which the bones lose minerals and strength with aging. This can result in serious bone fractures or breaks. The risk of osteoporosis can be identified using a bone density scan. Women ages 68 years and over and women at risk for fractures or osteoporosis should discuss screening with their health care providers. Ask your health care provider whether you should take a calcium supplement or vitamin D to reduce the rate of osteoporosis.  Menopause can be associated with physical symptoms and risks. Hormone replacement therapy is available to decrease symptoms and risks. You should talk to your health care provider about whether hormone replacement therapy is right for you.  Use sunscreen. Apply sunscreen liberally and repeatedly throughout the day.  You should seek shade when your shadow is shorter than you. Protect yourself by wearing long sleeves, pants, a wide-brimmed hat, and sunglasses year round, whenever you are outdoors.  Once a month, do a whole body skin exam, using a mirror to look at the skin on your back. Tell your health care provider of new moles, moles that have irregular borders, moles that are larger than a pencil eraser, or moles that have changed in shape or color.  Stay current with required vaccines (immunizations).  Influenza vaccine. All adults should be immunized every year.  Tetanus, diphtheria, and acellular pertussis (Td, Tdap) vaccine. Pregnant women should receive 1 dose of Tdap vaccine during each pregnancy. The dose should be obtained regardless of the length of time since the last dose. Immunization is preferred during the 27th-36th week of gestation. An adult who has not previously received Tdap or who does not know her vaccine status should receive 1 dose of Tdap. This initial dose should be followed by tetanus and diphtheria toxoids (Td) booster doses every 10 years. Adults with an unknown or incomplete history of completing a 3-dose immunization series with Td-containing vaccines should begin or complete a primary immunization series including a Tdap dose. Adults should receive a Td booster every 10 years.    Zoster vaccine. One dose is recommended for adults aged 36 years or older unless certain conditions are present.    Pneumococcal 13-valent conjugate (PCV13) vaccine. When indicated, a person who is uncertain of her immunization history and has no record of immunization should receive the PCV13 vaccine. An adult aged 1 years or older who has certain medical conditions and has not been previously immunized should receive 1 dose of PCV13 vaccine. This PCV13 should be followed with a dose of pneumococcal polysaccharide (PPSV23) vaccine. The PPSV23 vaccine dose should be obtained at least 8 weeks after the  dose of PCV13 vaccine. An adult aged 73 years or older who has certain medical conditions and previously received 1 or more doses of PPSV23 vaccine should receive 1 dose of PCV13. The PCV13 vaccine dose should be obtained 1 or more years after the last PPSV23 vaccine dose.    Pneumococcal polysaccharide (PPSV23) vaccine. When PCV13 is also indicated, PCV13 should be obtained first. All adults aged 47 years and older should be immunized. An adult younger than age 86 years who has certain medical conditions should be immunized. Any person who resides in a nursing home or long-term care facility should be immunized. An adult smoker should be immunized. People with an immunocompromised condition and certain other conditions should receive both PCV13 and PPSV23 vaccines. People with human immunodeficiency virus (HIV) infection should be immunized as soon as possible after diagnosis. Immunization  during chemotherapy or radiation therapy should be avoided. Routine use of PPSV23 vaccine is not recommended for American Indians, [place]atives, or people younger than 65 years unless there are medical conditions that require PPSV23 vaccine. When indicated, people who have unknown immunization and have no record of immunization should receive PPSV23 vaccine. One-time revaccination 5 years after the first dose of PPSV23 is recommended for people aged 19-64 years who have chronic kidney failure, nephrotic syndrome, asplenia, or immunocompromised conditions. People who received 1-2 doses of PPSV23 before age 52 years should receive another dose of PPSV23 vaccine at age 7 years or later if at least 5 years have passed since the previous dose. Doses of PPSV23 are not needed for people immunized with PPSV23 at or after age 33 years.   Preventive Services / Frequency  Ages 24 years and over  Blood pressure check.  Lipid and cholesterol check.  Lung cancer screening. / Every year if you are aged 57-80 years and have a  30-pack-year history of smoking and currently smoke or have quit within the past 15 years. Yearly screening is stopped once you have quit smoking for at least 15 years or develop a health problem that would prevent you from having lung cancer treatment.  Clinical breast exam.** / Every year after age 108 years.  BRCA-related cancer risk assessment.** / For women who have family members with a BRCA-related cancer (breast, ovarian, tubal, or peritoneal cancers).  Mammogram.** / Every year beginning at age 95 years and continuing for as long as you are in good health. Consult with your health care provider.  Pap test.** / Every 3 years starting at age 45 years through age 52 or 15 years with 3 consecutive normal Pap tests. Testing can be stopped between 65 and 70 years with 3 consecutive normal Pap tests and no abnormal Pap or HPV tests in the past 10 years.  Fecal occult blood test (FOBT) of stool. / Every year beginning at age 89 years and continuing until age 17 years. You may not need to do this test if you get a colonoscopy every 10 years.  Flexible sigmoidoscopy or colonoscopy.** / Every 5 years for a flexible sigmoidoscopy or every 10 years for a colonoscopy beginning at age 87 years and continuing until age 60 years.  Hepatitis C blood test.** / For all people born from [birth year] through 1967 and any individual with known risks for hepatitis C.  Osteoporosis screening.** / A one-time screening for women ages 52 years and over and women at risk for fractures or osteoporosis.  Skin self-exam. / Monthly.  Influenza vaccine. / Every year.  Tetanus, diphtheria, and acellular pertussis (Tdap/Td) vaccine.** / 1 dose of Td every 10 years.  Zoster vaccine.** / 1 dose for adults aged 42 years or older.  Pneumococcal 13-valent conjugate (PCV13) vaccine.** / Consult your health care provider.  Pneumococcal polysaccharide (PPSV23) vaccine.** / 1 dose for all adults aged 37 years and older. Screening  for abdominal aortic aneurysm (AAA)  by ultrasound is recommended for people who have history of high blood pressure or who are current or former smokers.

## 2015-05-25 LAB — URINALYSIS, ROUTINE W REFLEX MICROSCOPIC
Bilirubin Urine: NEGATIVE
Glucose, UA: NEGATIVE
Hgb urine dipstick: NEGATIVE
Leukocytes, UA: NEGATIVE
NITRITE: NEGATIVE
Protein, ur: NEGATIVE
SPECIFIC GRAVITY, URINE: 1.006 (ref 1.001–1.035)
pH: 5.5 (ref 5.0–8.0)

## 2015-05-25 LAB — MICROALBUMIN / CREATININE URINE RATIO
CREATININE, URINE: 40 mg/dL (ref 20–320)
MICROALB UR: 0.2 mg/dL
Microalb Creat Ratio: 5 mcg/mg creat (ref <30)

## 2015-05-25 LAB — TSH: TSH: 3.689 u[IU]/mL (ref 0.350–4.500)

## 2015-05-25 LAB — INSULIN, RANDOM: Insulin: 4 u[IU]/mL (ref 2.0–19.6)

## 2015-05-25 LAB — HEMOGLOBIN A1C
HEMOGLOBIN A1C: 5.9 % — AB (ref <5.7)
MEAN PLASMA GLUCOSE: 123 mg/dL — AB (ref <117)

## 2015-05-25 LAB — VITAMIN D 25 HYDROXY (VIT D DEFICIENCY, FRACTURES): Vit D, 25-Hydroxy: 40 ng/mL (ref 30–100)

## 2015-05-25 LAB — VITAMIN B12: VITAMIN B 12: 594 pg/mL (ref 211–911)

## 2015-09-06 ENCOUNTER — Ambulatory Visit: Payer: Self-pay | Admitting: Internal Medicine

## 2016-01-02 LAB — HM PAP SMEAR: HM Pap smear: NORMAL

## 2016-01-07 ENCOUNTER — Other Ambulatory Visit: Payer: Self-pay | Admitting: Obstetrics & Gynecology

## 2016-01-07 DIAGNOSIS — R928 Other abnormal and inconclusive findings on diagnostic imaging of breast: Secondary | ICD-10-CM

## 2016-01-21 ENCOUNTER — Ambulatory Visit
Admission: RE | Admit: 2016-01-21 | Discharge: 2016-01-21 | Disposition: A | Discharge status: Home / Self Care | Payer: BLUE CROSS/BLUE SHIELD | Source: Ambulatory Visit | Attending: Obstetrics & Gynecology | Admitting: Obstetrics & Gynecology

## 2016-01-21 DIAGNOSIS — R928 Other abnormal and inconclusive findings on diagnostic imaging of breast: Secondary | ICD-10-CM

## 2016-01-21 LAB — HM MAMMOGRAPHY

## 2016-05-20 ENCOUNTER — Encounter: Payer: Self-pay | Admitting: Internal Medicine

## 2016-05-26 ENCOUNTER — Ambulatory Visit (INDEPENDENT_AMBULATORY_CARE_PROVIDER_SITE_OTHER): Payer: BLUE CROSS/BLUE SHIELD | Admitting: Internal Medicine

## 2016-05-26 ENCOUNTER — Encounter: Payer: Self-pay | Admitting: Internal Medicine

## 2016-05-26 VITALS — BP 122/80 | HR 78 | Temp 98.1°F | Resp 16 | Ht 68.0 in | Wt 182.4 lb

## 2016-05-26 DIAGNOSIS — Z0001 Encounter for general adult medical examination with abnormal findings: Secondary | ICD-10-CM

## 2016-05-26 DIAGNOSIS — E559 Vitamin D deficiency, unspecified: Secondary | ICD-10-CM

## 2016-05-26 DIAGNOSIS — I10 Essential (primary) hypertension: Secondary | ICD-10-CM

## 2016-05-26 DIAGNOSIS — Q210 Ventricular septal defect: Secondary | ICD-10-CM

## 2016-05-26 DIAGNOSIS — Z0000 Encounter for general adult medical examination without abnormal findings: Secondary | ICD-10-CM

## 2016-05-26 DIAGNOSIS — Z136 Encounter for screening for cardiovascular disorders: Secondary | ICD-10-CM | POA: Diagnosis not present

## 2016-05-26 DIAGNOSIS — Z23 Encounter for immunization: Secondary | ICD-10-CM

## 2016-05-26 DIAGNOSIS — E782 Mixed hyperlipidemia: Secondary | ICD-10-CM

## 2016-05-26 DIAGNOSIS — Z1389 Encounter for screening for other disorder: Secondary | ICD-10-CM

## 2016-05-26 DIAGNOSIS — Z79899 Other long term (current) drug therapy: Secondary | ICD-10-CM

## 2016-05-26 DIAGNOSIS — M8580 Other specified disorders of bone density and structure, unspecified site: Secondary | ICD-10-CM

## 2016-05-26 DIAGNOSIS — Z1329 Encounter for screening for other suspected endocrine disorder: Secondary | ICD-10-CM

## 2016-05-26 DIAGNOSIS — R7303 Prediabetes: Secondary | ICD-10-CM

## 2016-05-26 DIAGNOSIS — M3500 Sicca syndrome, unspecified: Secondary | ICD-10-CM

## 2016-05-26 DIAGNOSIS — Z130 Encounter for screening for diseases of the blood and blood-forming organs and certain disorders involving the immune mechanism: Secondary | ICD-10-CM

## 2016-05-26 LAB — HEMOGLOBIN A1C
HEMOGLOBIN A1C: 5.5 % (ref <5.7)
Mean Plasma Glucose: 111 mg/dL

## 2016-05-26 LAB — BASIC METABOLIC PANEL WITH GFR
BUN: 8 mg/dL (ref 7–25)
CHLORIDE: 99 mmol/L (ref 98–110)
CO2: 24 mmol/L (ref 20–31)
Calcium: 9.4 mg/dL (ref 8.6–10.4)
Creat: 0.66 mg/dL (ref 0.50–0.99)
GFR, Est African American: >89 mL/min (ref >=60)
GLUCOSE: 90 mg/dL (ref 65–99)
POTASSIUM: 4.3 mmol/L (ref 3.5–5.3)
Sodium: 137 mmol/L (ref 135–146)

## 2016-05-26 LAB — LIPID PANEL
Cholesterol: 158 mg/dL (ref <200)
HDL: 68 mg/dL (ref >50)
LDL CALC: 77 mg/dL (ref <100)
Total CHOL/HDL Ratio: 2.3 Ratio (ref <5.0)
Triglycerides: 65 mg/dL (ref <150)
VLDL: 13 mg/dL (ref <30)

## 2016-05-26 LAB — HEPATIC FUNCTION PANEL
ALK PHOS: 72 U/L (ref 33–130)
ALT: 17 U/L (ref 6–29)
AST: 20 U/L (ref 10–35)
Albumin: 4.6 g/dL (ref 3.6–5.1)
BILIRUBIN INDIRECT: 0.5 mg/dL (ref 0.2–1.2)
Bilirubin, Direct: 0.1 mg/dL (ref <=0.2)
TOTAL PROTEIN: 7 g/dL (ref 6.1–8.1)
Total Bilirubin: 0.6 mg/dL (ref 0.2–1.2)

## 2016-05-26 LAB — IRON AND TIBC
%SAT: 21 % (ref 11–50)
IRON: 70 ug/dL (ref 45–160)
TIBC: 329 ug/dL (ref 250–450)
UIBC: 259 ug/dL (ref 125–400)

## 2016-05-26 LAB — CBC WITH DIFFERENTIAL/PLATELET
BASOS PCT: 0 %
Basophils Absolute: 0 cells/uL (ref 0–200)
EOS PCT: 2 %
Eosinophils Absolute: 180 cells/uL (ref 15–500)
HEMATOCRIT: 39.4 % (ref 35.0–45.0)
HEMOGLOBIN: 12.9 g/dL (ref 11.7–15.5)
LYMPHS ABS: 2700 {cells}/uL (ref 850–3900)
Lymphocytes Relative: 30 %
MCH: 29 pg (ref 27.0–33.0)
MCHC: 32.7 g/dL (ref 32.0–36.0)
MCV: 88.5 fL (ref 80.0–100.0)
MONO ABS: 630 {cells}/uL (ref 200–950)
MPV: 9.4 fL (ref 7.5–12.5)
Monocytes Relative: 7 %
NEUTROS ABS: 5490 {cells}/uL (ref 1500–7800)
Neutrophils Relative %: 61 %
Platelets: 305 10*3/uL (ref 140–400)
RBC: 4.45 MIL/uL (ref 3.80–5.10)
RDW: 13.6 % (ref 11.0–15.0)
WBC: 9 10*3/uL (ref 3.8–10.8)

## 2016-05-26 LAB — VITAMIN B12: VITAMIN B 12: 520 pg/mL (ref 200–1100)

## 2016-05-26 LAB — TSH: TSH: 3.46 m[IU]/L

## 2016-05-26 NOTE — Progress Notes (Addendum)
Complete Physical  Assessment and Plan:   1. Encounter for general adult medical examination with abnormal findings -due next year  2. Prediabetes -cont diet and exercise - Hemoglobin A1c - Insulin, random  3. Mixed hyperlipidemia  - Lipid panel  4. VSD (ventricular septal defect), perimembranous -followed also by cardiology - EKG 12-Lead  5. Sjogren's syndrome, with unspecified organ involvement (HCC) -followed by dermatology and also with opthalmology  6. Osteopenia, unspecified location -cont calcium and vitamin D -Dr. Jennette Kettle is handling DEXA scans  7. Vitamin D deficiency -Cont VIt D  8. Medication management  - CBC with Differential/Platelet - BASIC METABOLIC PANEL WITH GFR - Hepatic function panel  9. Screening for deficiency anemia  - Iron and TIBC - Vitamin B12  10. Screening for hematuria or proteinuria  - Urinalysis, Routine w reflex microscopic - Microalbumin / creatinine urine ratio  11. Screening for thyroid disorder  - TSH  12. Flu vaccine need - Flu vaccine 6-56mo preservative free IM   Discussed med's effects and SE's. Screening labs and tests as requested with regular follow-up as recommended.  HPI  61 y.o. female  presents for a complete physical.  Her blood pressure has been controlled at home, today their BP is BP: 122/80.  She does not workout. She denies chest pain, shortness of breath, dizziness.   She is not on cholesterol medication and denies myalgias. Her cholesterol is at goal. The cholesterol last visit was:  Lab Results  Component Value Date   CHOL 169 05/24/2015   HDL 76 05/24/2015   LDLCALC 82 05/24/2015   TRIG 55 05/24/2015   CHOLHDL 2.2 05/24/2015  .  She has been working on diet and exercise for prediabetes, she is not on bASA, she is not on ACE/ARB and denies foot ulcerations, hyperglycemia, hypoglycemia , increased appetite, nausea, paresthesia of the feet, polydipsia, polyuria, visual disturbances, vomiting  and weight loss. Last A1C in the office was:  Lab Results  Component Value Date   HGBA1C 5.9 (H) 05/24/2015    Patient is on Vitamin D supplement.   Lab Results  Component Value Date   VD25OH 40 05/24/2015     She reports that her sjogrens has been causing her to have some dry eye.  She is using some samples of eye drops.  She reports that she is also been having some dry skin.  She reports that she frequently uses moisturizers.    She reports that she has had some issues with hot flashes.  She knows that there are triggers like stress, chocolate, and caffeine.  She reports that she has had no relief from estrogen.    She does still see Dr. Jennette Kettle yearly.  He is handling her pap smears, estrogen use and also her dexa scans.  Current Medications:  Current Outpatient Prescriptions on File Prior to Visit  Medication Sig Dispense Refill  . B Complex-C (SUPER B COMPLEX PO) Take by mouth 2 (two) times daily.     . Calcium Carbonate-Vitamin D (CALTRATE 600+D PO) Take by mouth 2 (two) times daily. Patient states 630 mg calcium and 500 IU Vitamin D    . Cholecalciferol (VITAMIN D) 2000 UNITS tablet Take 2,000 Units by mouth daily.    . Estradiol (VAGIFEM VA) Place vaginally. Insert vaginally twice weekly    . Magnesium 400 MG TABS Take by mouth daily.    . Omega 3-6-9 Fatty Acids (OMEGA 3-6-9 COMPLEX PO) Take 1 tablet by mouth daily.     No  current facility-administered medications on file prior to visit.     Health Maintenance:   Immunization History  Administered Date(s) Administered  . Influenza, Seasonal, Injecte, Preservative Fre 05/26/2016  . Influenza-Unspecified 01/25/2013, 02/09/2014  . PPD Test 04/19/2013, 05/17/2014  . Pneumococcal Polysaccharide-23 04/19/2013  . Tdap 08/20/2010    Tetanus: 2012 Pneumovax:  2014 Flu vaccine: 2018 Pap: Dr. Jennette KettleNeal MGM: Dr. Marcelina MorelNeals office [id number] Colonoscopy: 2012 Last Dental Exam:  Twice yearly visits Last Eye Exam: Dr. Burundiman  2018  Patient Care Team: Lucky CowboyWilliam McKeown, MD as PCP - General (Internal Medicine) Heather Burundiman, OD (Optometry) Freddy FinnerW Ronald Neal, MD as Consulting Physician (Obstetrics and Gynecology) Lurena NidaAnthony Anderson, MD as Consulting Physician (Rheumatology) Charlott RakesVincent Schooler, MD as Consulting Physician (Gastroenterology) Thurmon FairMihai Croitoru, MD as Consulting Physician (Cardiology) Nyoka CowdenMichael B Wert, MD as Consulting Physician (Pulmonary Disease) Thurmon FairMihai Croitoru, MD as Consulting Physician (Cardiology) Amy SwazilandJordan, MD as Consulting Physician (Dermatology)  Allergies: No Known Allergies  Medical History:  Past Medical History:  Diagnosis Date  . Anemia   . GERD (gastroesophageal reflux disease)   . Migraines   . Osteopenia   . Sjogren's disease (HCC)   . Unspecified vitamin D deficiency   . VSD (ventricular septal defect), perimembranous     Surgical History:  Past Surgical History:  Procedure Laterality Date  . ABLATION ON ENDOMETRIOSIS    . cervical conization  1976  . DILATION AND CURETTAGE OF UTERUS     x 2  . ESOPHAGOGASTRODUODENOSCOPY  01/28/13   Dr Bosie ClosSchooler- dysmotility  . lip biopsy     + sjogrens    Family History:  Family History  Problem Relation Age of Onset  . Hypertension Mother   . Stroke Mother   . Hypertension Father   . Stroke Father   . Bladder Cancer Father   . Ovarian cancer Maternal Aunt   . Uterine cancer Maternal Grandmother   . ALS Other   . Multiple sclerosis Cousin   . Parkinson's disease Cousin   . Breast cancer Cousin     Social History:  Social History  Substance Use Topics  . Smoking status: Never Smoker  . Smokeless tobacco: Not on file  . Alcohol use Yes     Comment: rare    Review of Systems: Review of Systems  Constitutional: Negative for chills, fever and malaise/fatigue.  HENT: Negative for congestion, ear pain and sore throat.   Eyes: Negative.        Dry eye  Respiratory: Negative for cough, shortness of breath and wheezing.    Cardiovascular: Negative for chest pain, palpitations and leg swelling.  Gastrointestinal: Negative for abdominal pain, blood in stool, constipation, diarrhea, heartburn and melena.  Genitourinary: Negative.   Skin: Negative.   Neurological: Negative for dizziness, sensory change, loss of consciousness and headaches.  Psychiatric/Behavioral: Negative for depression. The patient is not nervous/anxious and does not have insomnia.     Physical Exam: Estimated body mass index is 27.73 kg/m as calculated from the following:   Height as of this encounter: 5\' 8"  (1.727 m).   Weight as of this encounter: 182 lb 6.4 oz (82.7 kg). BP 122/80   Pulse 78   Temp 98.1 F (36.7 C)   Resp 16   Ht 5\' 8"  (1.727 m)   Wt 182 lb 6.4 oz (82.7 kg)   SpO2 98%   BMI 27.73 kg/m   General Appearance: Well nourished well developed, in no apparent distress.  Eyes: PERRLA, EOMs, conjunctiva no swelling or erythema ENT/Mouth:  Ear canals normal without obstruction, swelling, erythema, or discharge.  TMs normal bilaterally with no erythema, bulging, retraction, or loss of landmark.  Oropharynx moist and clear with no exudate, erythema, or swelling.   Neck: Supple, thyroid normal. No bruits.  No cervical adenopathy Respiratory: Respiratory effort normal, Breath sounds clear A&P without wheeze, rhonchi, rales.   Cardio: RRR with holosystolic low pitched murmur heard best on the L 3rd ICS, no rubs or gallops. Brisk peripheral pulses without edema.  Chest: symmetric, with normal excursions Breasts: Deferred to obgyn.  Abdomen: Soft, nontender, no guarding, rebound, hernias, masses, or organomegaly.  Lymphatics: Non tender without lymphadenopathy. Musculoskeletal: Full ROM all peripheral extremities,5/5 strength, and normal gait.  Skin: Warm, dry without rashes, lesions, ecchymosis. Neuro: Awake and oriented X 3, Cranial nerves intact, reflexes equal bilaterally. Normal muscle tone, no cerebellar symptoms. Sensation  intact.  Psych:  normal affect, Insight and Judgment appropriate.   EKG: WNL no changes.         Over 40 minutes of exam, counseling, chart review and critical decision making was performed  Toni Amend Forcucci 2:22 PM [facility]

## 2016-05-26 NOTE — Patient Instructions (Signed)
Paroxetine tablets What is this medicine? PAROXETINE (pa ROX e teen) is used to treat depression. It may also be used to treat anxiety disorders, obsessive compulsive disorder, panic attacks, post traumatic stress, and premenstrual dysphoric disorder (PMDD). This medicine may be used for other purposes; ask your health care provider or pharmacist if you have questions. COMMON BRAND NAME(S): Paxil, Pexeva What should I tell my health care provider before I take this medicine? They need to know if you have any of these conditions: -bipolar disorder or a family history of bipolar disorder -bleeding disorders -glaucoma -heart disease -kidney disease -liver disease -low levels of sodium in the blood -seizures -suicidal thoughts, plans, or attempt; a previous suicide attempt by you or a family member -take MAOIs like Carbex, Eldepryl, Marplan, Nardil, and Parnate -take medicines that treat or prevent blood clots -thyroid disease -an unusual or allergic reaction to paroxetine, other medicines, foods, dyes, or preservatives -pregnant or trying to get pregnant -breast-feeding How should I use this medicine? Take this medicine by mouth with a glass of water. Follow the directions on the prescription label. You can take it with or without food. Take your medicine at regular intervals. Do not take your medicine more often than directed. Do not stop taking this medicine suddenly except upon the advice of your doctor. Stopping this medicine too quickly may cause serious side effects or your condition may worsen. A special MedGuide will be given to you by the pharmacist with each prescription and refill. Be sure to read this information carefully each time. Talk to your pediatrician regarding the use of this medicine in children. Special care may be needed. Overdosage: If you think you have taken too much of this medicine contact a poison control center or emergency room at once. NOTE: This medicine is  only for you. Do not share this medicine with others. What if I miss a dose? If you miss a dose, take it as soon as you can. If it is almost time for your next dose, take only that dose. Do not take double or extra doses. What may interact with this medicine? Do not take this medicine with any of the following medications: -linezolid -MAOIs like Carbex, Eldepryl, Marplan, Nardil, and Parnate -methylene blue (injected into a vein) -pimozide -thioridazine This medicine may also interact with the following medications: -alcohol -amphetamines -aspirin and aspirin-like medicines -atomoxetine -certain medicines for depression, anxiety, or psychotic disturbances -certain medicines for irregular heart beat like propafenone, flecainide, encainide, and quinidine -certain medicines for migraine headache like almotriptan, eletriptan, frovatriptan, naratriptan, rizatriptan, sumatriptan, zolmitriptan -cimetidine -digoxin -diuretics -fentanyl -fosamprenavir -furazolidone -isoniazid -lithium -medicines that treat or prevent blood clots like warfarin, enoxaparin, and dalteparin -medicines for sleep -NSAIDs, medicines for pain and inflammation, like ibuprofen or naproxen -phenobarbital -phenytoin -procarbazine -rasagiline -ritonavir -supplements like St. John's wort, kava kava, valerian -tamoxifen -tramadol -tryptophan This list may not describe all possible interactions. Give your health care provider a list of all the medicines, herbs, non-prescription drugs, or dietary supplements you use. Also tell them if you smoke, drink alcohol, or use illegal drugs. Some items may interact with your medicine. What should I watch for while using this medicine? Tell your doctor if your symptoms do not get better or if they get worse. Visit your doctor or health care professional for regular checks on your progress. Because it may take several weeks to see the full effects of this medicine, it is important  to continue your treatment as prescribed by your doctor.  Patients and their families should watch out for new or worsening thoughts of suicide or depression. Also watch out for sudden changes in feelings such as feeling anxious, agitated, panicky, irritable, hostile, aggressive, impulsive, severely restless, overly excited and hyperactive, or not being able to sleep. If this happens, especially at the beginning of treatment or after a change in dose, call your health care professional. Bonita QuinYou may get drowsy or dizzy. Do not drive, use machinery, or do anything that needs mental alertness until you know how this medicine affects you. Do not stand or sit up quickly, especially if you are an older patient. This reduces the risk of dizzy or fainting spells. Alcohol may interfere with the effect of this medicine. Avoid alcoholic drinks. Your mouth may get dry. Chewing sugarless gum or sucking hard candy, and drinking plenty of water will help. Contact your doctor if the problem does not go away or is severe. What side effects may I notice from receiving this medicine? Side effects that you should report to your doctor or health care professional as soon as possible: -allergic reactions like skin rash, itching or hives, swelling of the face, lips, or tongue -anxious -black, tarry stools -changes in vision -confusion -elevated mood, decreased need for sleep, racing thoughts, impulsive behavior -eye pain -fast, irregular heartbeat -feeling faint or lightheaded, falls -feeling agitated, angry, or irritable -hallucination, loss of contact with reality -loss of balance or coordination -loss of memory -painful or prolonged erections -restlessness, pacing, inability to keep still -seizures -stiff muscles -suicidal thoughts or other mood changes -trouble sleeping -unusual bleeding or bruising -unusually weak or tired -vomiting Side effects that usually do not require medical attention (report to your  doctor or health care professional if they continue or are bothersome): -change in appetite or weight -change in sex drive or performance -diarrhea -dizziness -dry mouth -increased sweating -indigestion, nausea -tired -tremors This list may not describe all possible side effects. Call your doctor for medical advice about side effects. You may report side effects to FDA at 1-800-FDA-1088. Where should I keep my medicine? Keep out of the reach of children. Store at room temperature between 15 and 30 degrees C (59 and 86 degrees F). Keep container tightly closed. Throw away any unused medicine after the expiration date. NOTE: This sheet is a summary. It may not cover all possible information. If you have questions about this medicine, talk to your doctor, pharmacist, or health care provider.  2017 [employer]/Gold Standard (2015-09-29 15:50:32)

## 2016-05-27 LAB — URINALYSIS, ROUTINE W REFLEX MICROSCOPIC
Bilirubin Urine: NEGATIVE
GLUCOSE, UA: NEGATIVE
Hgb urine dipstick: NEGATIVE
Ketones, ur: NEGATIVE
Nitrite: NEGATIVE
PROTEIN: NEGATIVE
Specific Gravity, Urine: 1.007 (ref 1.001–1.035)
pH: 6 (ref 5.0–8.0)

## 2016-05-27 LAB — URINALYSIS, MICROSCOPIC ONLY
BACTERIA UA: NONE SEEN [HPF]
Casts: NONE SEEN [LPF]
Crystals: NONE SEEN [HPF]
RBC / HPF: NONE SEEN RBC/HPF (ref <=2)
SQUAMOUS EPITHELIAL / LPF: NONE SEEN [HPF] (ref <=5)
Yeast: NONE SEEN [HPF]

## 2016-05-27 LAB — MICROALBUMIN / CREATININE URINE RATIO: Creatinine, Urine: 34 mg/dL (ref 20–320)

## 2016-05-27 LAB — INSULIN, RANDOM: INSULIN: 3.8 u[IU]/mL (ref 2.0–19.6)

## 2016-09-24 ENCOUNTER — Encounter: Payer: Self-pay | Admitting: *Deleted

## 2017-04-27 ENCOUNTER — Other Ambulatory Visit: Payer: Self-pay

## 2017-04-27 ENCOUNTER — Other Ambulatory Visit: Payer: Self-pay | Admitting: Adult Health

## 2017-04-27 ENCOUNTER — Ambulatory Visit (INDEPENDENT_AMBULATORY_CARE_PROVIDER_SITE_OTHER): Payer: BLUE CROSS/BLUE SHIELD | Admitting: Adult Health

## 2017-04-27 ENCOUNTER — Encounter: Payer: Self-pay | Admitting: Adult Health

## 2017-04-27 VITALS — BP 130/80 | HR 94 | Temp 98.1°F | Ht 68.0 in | Wt 171.0 lb

## 2017-04-27 DIAGNOSIS — F419 Anxiety disorder, unspecified: Secondary | ICD-10-CM

## 2017-04-27 DIAGNOSIS — H65191 Other acute nonsuppurative otitis media, right ear: Secondary | ICD-10-CM | POA: Diagnosis not present

## 2017-04-27 MED ORDER — BUSPIRONE HCL 10 MG PO TABS: ORAL_TABLET | ORAL | 2 refills | Status: DC

## 2017-04-27 MED ORDER — BUSPIRONE HCL 10 MG PO TABS: ORAL_TABLET | ORAL | 0 refills | Status: DC

## 2017-04-27 MED ORDER — PREDNISONE 20 MG PO TABS: ORAL_TABLET | ORAL | 0 refills | Status: DC

## 2017-04-27 MED ORDER — ESCITALOPRAM OXALATE 10 MG PO TABS: 10.0000 mg | ORAL_TABLET | Freq: Every day | ORAL | 2 refills | Status: DC

## 2017-04-27 NOTE — Patient Instructions (Signed)
Escitalopram tablets What is this medicine? ESCITALOPRAM (es sye TAL oh pram) is used to treat depression and certain types of anxiety. This medicine may be used for other purposes; ask your health care provider or pharmacist if you have questions. COMMON BRAND NAME(S): Lexapro What should I tell my health care provider before I take this medicine? They need to know if you have any of these conditions: -bipolar disorder or a family history of bipolar disorder -diabetes -glaucoma -heart disease -kidney or liver disease -receiving electroconvulsive therapy -seizures (convulsions) -suicidal thoughts, plans, or attempt by you or a family member -an unusual or allergic reaction to escitalopram, the related drug citalopram, other medicines, foods, dyes, or preservatives -pregnant or trying to become pregnant -breast-feeding How should I use this medicine? Take this medicine by mouth with a glass of water. Follow the directions on the prescription label. You can take it with or without food. If it upsets your stomach, take it with food. Take your medicine at regular intervals. Do not take it more often than directed. Do not stop taking this medicine suddenly except upon the advice of your doctor. Stopping this medicine too quickly may cause serious side effects or your condition may worsen. A special MedGuide will be given to you by the pharmacist with each prescription and refill. Be sure to read this information carefully each time. Talk to your pediatrician regarding the use of this medicine in children. Special care may be needed. Overdosage: If you think you have taken too much of this medicine contact a poison control center or emergency room at once. NOTE: This medicine is only for you. Do not share this medicine with others. What if I miss a dose? If you miss a dose, take it as soon as you can. If it is almost time for your next dose, take only that dose. Do not take double or extra  doses. What may interact with this medicine? Do not take this medicine with any of the following medications: -certain medicines for fungal infections like fluconazole, itraconazole, ketoconazole, posaconazole, voriconazole -cisapride -citalopram -dofetilide -dronedarone -linezolid -MAOIs like Carbex, Eldepryl, Marplan, Nardil, and Parnate -methylene blue (injected into a vein) -pimozide -thioridazine -ziprasidone This medicine may also interact with the following medications: -alcohol -amphetamines -aspirin and aspirin-like medicines -carbamazepine -certain medicines for depression, anxiety, or psychotic disturbances -certain medicines for migraine headache like almotriptan, eletriptan, frovatriptan, naratriptan, rizatriptan, sumatriptan, zolmitriptan -certain medicines for sleep -certain medicines that treat or prevent blood clots like warfarin, enoxaparin, dalteparin -cimetidine -diuretics -fentanyl -furazolidone -isoniazid -lithium -metoprolol -NSAIDs, medicines for pain and inflammation, like ibuprofen or naproxen -other medicines that prolong the QT interval (cause an abnormal heart rhythm) -procarbazine -rasagiline -supplements like St. John's wort, kava kava, valerian -tramadol -tryptophan This list may not describe all possible interactions. Give your health care provider a list of all the medicines, herbs, non-prescription drugs, or dietary supplements you use. Also tell them if you smoke, drink alcohol, or use illegal drugs. Some items may interact with your medicine. What should I watch for while using this medicine? Tell your doctor if your symptoms do not get better or if they get worse. Visit your doctor or health care professional for regular checks on your progress. Because it may take several weeks to see the full effects of this medicine, it is important to continue your treatment as prescribed by your doctor. Patients and their families should watch out for  new or worsening thoughts of suicide or depression. Also watch out for   sudden changes in feelings such as feeling anxious, agitated, panicky, irritable, hostile, aggressive, impulsive, severely restless, overly excited and hyperactive, or not being able to sleep. If this happens, especially at the beginning of treatment or after a change in dose, call your health care professional. Bonita QuinYou may get drowsy or dizzy. Do not drive, use machinery, or do anything that needs mental alertness until you know how this medicine affects you. Do not stand or sit up quickly, especially if you are an older patient. This reduces the risk of dizzy or fainting spells. Alcohol may interfere with the effect of this medicine. Avoid alcoholic drinks. Your mouth may get dry. Chewing sugarless gum or sucking hard candy, and drinking plenty of water may help. Contact your doctor if the problem does not go away or is severe. What side effects may I notice from receiving this medicine? Side effects that you should report to your doctor or health care professional as soon as possible: -allergic reactions like skin rash, itching or hives, swelling of the face, lips, or tongue -anxious -black, tarry stools -changes in vision -confusion -elevated mood, decreased need for sleep, racing thoughts, impulsive behavior -eye pain -fast, irregular heartbeat -feeling faint or lightheaded, falls -feeling agitated, angry, or irritable -hallucination, loss of contact with reality -loss of balance or coordination -loss of memory -painful or prolonged erections -restlessness, pacing, inability to keep still -seizures -stiff muscles -suicidal thoughts or other mood changes -trouble sleeping -unusual bleeding or bruising -unusually weak or tired -vomiting Side effects that usually do not require medical attention (report to your doctor or health care professional if they continue or are bothersome): -changes in appetite -change in sex  drive or performance -headache -increased sweating -indigestion, nausea -tremors This list may not describe all possible side effects. Call your doctor for medical advice about side effects. You may report side effects to FDA at 1-800-FDA-1088. Where should I keep my medicine? Keep out of reach of children. Store at room temperature between 15 and 30 degrees C (59 and 86 degrees F). Throw away any unused medicine after the expiration date. NOTE: This sheet is a summary. It may not cover all possible information. If you have questions about this medicine, talk to your doctor, pharmacist, or health care provider.  2018 Elsevier/Gold Standard (2015-10-01 13:20:23)     Buspirone tablets What is this medicine? BUSPIRONE (byoo SPYE rone) is used to treat anxiety disorders. This medicine may be used for other purposes; ask your health care provider or pharmacist if you have questions. COMMON BRAND NAME(S): BuSpar What should I tell my health care provider before I take this medicine? They need to know if you have any of these conditions: -kidney or liver disease -an unusual or allergic reaction to buspirone, other medicines, foods, dyes, or preservatives -pregnant or trying to get pregnant -breast-feeding How should I use this medicine? Take this medicine by mouth with a glass of water. Follow the directions on the prescription label. You may take this medicine with or without food. To ensure that this medicine always works the same way for you, you should take it either always with or always without food. Take your doses at regular intervals. Do not take your medicine more often than directed. Do not stop taking except on the advice of your doctor or health care professional. Talk to your pediatrician regarding the use of this medicine in children. Special care may be needed. Overdosage: If you think you have taken too much of this medicine  contact a poison control center or emergency room at  once. NOTE: This medicine is only for you. Do not share this medicine with others. What if I miss a dose? If you miss a dose, take it as soon as you can. If it is almost time for your next dose, take only that dose. Do not take double or extra doses. What may interact with this medicine? Do not take this medicine with any of the following medications: -linezolid -MAOIs like Carbex, Eldepryl, Marplan, Nardil, and Parnate -methylene blue -procarbazine This medicine may also interact with the following medications: -diazepam -digoxin -diltiazem -erythromycin -grapefruit juice -haloperidol -medicines for mental depression or mood problems -medicines for seizures like carbamazepine, phenobarbital and phenytoin -nefazodone -other medications for anxiety -rifampin -ritonavir -some antifungal medicines like itraconazole, ketoconazole, and voriconazole -verapamil -warfarin This list may not describe all possible interactions. Give your health care provider a list of all the medicines, herbs, non-prescription drugs, or dietary supplements you use. Also tell them if you smoke, drink alcohol, or use illegal drugs. Some items may interact with your medicine. What should I watch for while using this medicine? Visit your doctor or health care professional for regular checks on your progress. It may take 1 to 2 weeks before your anxiety gets better. You may get drowsy or dizzy. Do not drive, use machinery, or do anything that needs mental alertness until you know how this drug affects you. Do not stand or sit up quickly, especially if you are an older patient. This reduces the risk of dizzy or fainting spells. Alcohol can make you more drowsy and dizzy. Avoid alcoholic drinks. What side effects may I notice from receiving this medicine? Side effects that you should report to your doctor or health care professional as soon as possible: -blurred vision or other vision changes -chest  pain -confusion -difficulty breathing -feelings of hostility or anger -muscle aches and pains -numbness or tingling in hands or feet -ringing in the ears -skin rash and itching -vomiting -weakness Side effects that usually do not require medical attention (report to your doctor or health care professional if they continue or are bothersome): -disturbed dreams, nightmares -headache -nausea -restlessness or nervousness -sore throat and nasal congestion -stomach upset This list may not describe all possible side effects. Call your doctor for medical advice about side effects. You may report side effects to FDA at 1-800-FDA-1088. Where should I keep my medicine? Keep out of the reach of children. Store at room temperature below 30 degrees C (86 degrees F). Protect from light. Keep container tightly closed. Throw away any unused medicine after the expiration date. NOTE: This sheet is a summary. It may not cover all possible information. If you have questions about this medicine, talk to your doctor, pharmacist, or health care provider.  2018 Elsevier/Gold Standard (2009-12-06 18:06:11)     Generalized Anxiety Disorder, Adult Generalized anxiety disorder (GAD) is a mental health disorder. People with this condition constantly worry about everyday events. Unlike normal anxiety, worry related to GAD is not triggered by a specific event. These worries also do not fade or get better with time. GAD interferes with life functions, including relationships, work, and school. GAD can vary from mild to severe. People with severe GAD can have intense waves of anxiety with physical symptoms (panic attacks). What are the causes? The exact cause of GAD is not known. What increases the risk? This condition is more likely to develop in:  Women.  People who have a family  history of anxiety disorders.  People who are very shy.  People who experience very stressful life events, such as the death of  a loved one.  People who have a very stressful family environment.  What are the signs or symptoms? People with GAD often worry excessively about many things in their lives, such as their health and family. They may also be overly concerned about:  Doing well at work.  Being on time.  Natural disasters.  Friendships.  Physical symptoms of GAD include:  Fatigue.  Muscle tension or having muscle twitches.  Trembling or feeling shaky.  Being easily startled.  Feeling like your heart is pounding or racing.  Feeling out of breath or like you cannot take a deep breath.  Having trouble falling asleep or staying asleep.  Sweating.  Nausea, diarrhea, or irritable bowel syndrome (IBS).  Headaches.  Trouble concentrating or remembering facts.  Restlessness.  Irritability.  How is this diagnosed? Your health care provider can diagnose GAD based on your symptoms and medical history. You will also have a physical exam. The health care provider will ask specific questions about your symptoms, including how severe they are, when they started, and if they come and go. Your health care provider may ask you about your use of alcohol or drugs, including prescription medicines. Your health care provider may refer you to a mental health specialist for further evaluation. Your health care provider will do a thorough examination and may perform additional tests to rule out other possible causes of your symptoms. To be diagnosed with GAD, a person must have anxiety that:  Is out of his or her control.  Affects several different aspects of his or her life, such as work and relationships.  Causes distress that makes him or her unable to take part in normal activities.  Includes at least three physical symptoms of GAD, such as restlessness, fatigue, trouble concentrating, irritability, muscle tension, or sleep problems.  Before your health care provider can confirm a diagnosis of GAD,  these symptoms must be present more days than they are not, and they must last for six months or longer. How is this treated? The following therapies are usually used to treat GAD:  Medicine. Antidepressant medicine is usually prescribed for long-term daily control. Antianxiety medicines may be added in severe cases, especially when panic attacks occur.  Talk therapy (psychotherapy). Certain types of talk therapy can be helpful in treating GAD by providing support, education, and guidance. Options include: ? Cognitive behavioral therapy (CBT). People learn coping skills and techniques to ease their anxiety. They learn to identify unrealistic or negative thoughts and behaviors and to replace them with positive ones. ? Acceptance and commitment therapy (ACT). This treatment teaches people how to be mindful as a way to cope with unwanted thoughts and feelings. ? Biofeedback. This process trains you to manage your body's response (physiological response) through breathing techniques and relaxation methods. You will work with a therapist while machines are used to monitor your physical symptoms.  Stress management techniques. These include yoga, meditation, and exercise.  A mental health specialist can help determine which treatment is best for you. Some people see improvement with one type of therapy. However, other people require a combination of therapies. Follow these instructions at home:  Take over-the-counter and prescription medicines only as told by your health care provider.  Try to maintain a normal routine.  Try to anticipate stressful situations and allow extra time to manage them.  Practice any  stress management or self-calming techniques as taught by your health care provider.  Do not punish yourself for setbacks or for not making progress.  Try to recognize your accomplishments, even if they are small.  Keep all follow-up visits as told by your health care provider. This is  important. Contact a health care provider if:  Your symptoms do not get better.  Your symptoms get worse.  You have signs of depression, such as: ? A persistently sad, cranky, or irritable mood. ? Loss of enjoyment in activities that used to bring you joy. ? Change in weight or eating. ? Changes in sleeping habits. ? Avoiding friends or family members. ? Loss of energy for normal tasks. ? Feelings of guilt or worthlessness. Get help right away if:  You have serious thoughts about hurting yourself or others. If you ever feel like you may hurt yourself or others, or have thoughts about taking your own life, get help right away. You can go to your nearest emergency department or call:  Your local emergency services (911 in the [place]  A suicide crisis helpline, such as the National Suicide Prevention Lifeline at 928-864-8170. This is open 24 hours a day.  Summary  Generalized anxiety disorder (GAD) is a mental health disorder that involves worry that is not triggered by a specific event.  People with GAD often worry excessively about many things in their lives, such as their health and family.  GAD may cause physical symptoms such as restlessness, trouble concentrating, sleep problems, frequent sweating, nausea, diarrhea, headaches, and trembling or muscle twitching.  A mental health specialist can help determine which treatment is best for you. Some people see improvement with one type of therapy. However, other people require a combination of therapies. This information is not intended to replace advice given to you by your health care provider. Make sure you discuss any questions you have with your health care provider. Document Released: 08/23/2012 Document Revised: 03/18/2016 Document Reviewed: 03/18/2016 Elsevier Interactive Patient Education  [employer].

## 2017-04-27 NOTE — Progress Notes (Signed)
Assessment and Plan: Bianca LundJanet was seen today for ear fullness.  Diagnoses and all orders for this visit:  Anxiety Start new medication as prescribed Stress management techniques discussed, increase water, good sleep hygiene discussed, increase exercise, and increase veggies.  Follow up 1 month, call the office if any new AE's from medications and we will switch them -     escitalopram (LEXAPRO) 10 MG tablet; Take 1 tablet (10 mg total) by mouth daily. -     busPIRone (BUSPAR) 10 MG tablet; Take 1/2-1 tab up to three times a day as needed for anxiety - short term medication until lexapro is therapeutic.   Acute effusion of right ear -Allergy pill, flonase, autoinflation, explained no need for ABX at this time.  - if not better 2-4 weeks will refer to ENT -     predniSONE (DELTASONE) 20 MG tablet; 2 tablets daily for 3 days, 1 tablet daily for 4 days.  Further disposition pending results of labs. Discussed med's effects and SE's.   Over 20 minutes of exam, counseling, chart review, and critical decision making was performed.   Future Appointments  Date Time Provider Department Center  05/26/2017 10:00 AM Bianca Fernandez, Amanda, PA-C [facility] None    ------------------------------------------------------------------------------------------------------------------   HPI BP 130/80   Pulse 94   Temp 98.1 F (36.7 C)   Ht 5\' 8"  (1.727 m)   Wt 171 lb (77.6 kg)   SpO2 96%   BMI 26.00 kg/m   61 y.o.female presents accompanies by her daughter c/o a full sensation in her R ear ongoing for 2 weeks. She reports she can hear "like a heart beat" in her ear. She denies pain, tinnitis, discharge, decreased hearing. She reports she has had intermittent problems with the ear after a severe ear infection in 1970s; she denies further infections since. Denies having tubes placed. She denies HA/dizziness, cough, other URI symptoms, fever/chills.   She is followed by Dr. Kathi LudwigSyed for Sjogren's   She reports  ongoing anxiety (daughter supports this) since her diagnosis of Sjogren's several years ago. She reports increased irritability, some difficulty sleeping. She reports she has been treated by benzodiazepines in the past. She also reports hot flashes ongoing for several years - discussed starting medications for anxiety today.   She has had her thyroid monitored routinely and has not been elevated.  Lab Results  Component Value Date   TSH 3.46 05/26/2016  .     Past Medical History:  Diagnosis Date  . Anemia   . GERD (gastroesophageal reflux disease)   . Migraines   . Osteopenia   . Sjogren's disease (HCC)   . Unspecified vitamin D deficiency   . VSD (ventricular septal defect), perimembranous      No Known Allergies  Current Outpatient Medications on File Prior to Visit  Medication Sig  . Calcium Carbonate-Vitamin D (CALTRATE 600+D PO) Take by mouth 2 (two) times daily. Patient states 630 mg calcium and 500 IU Vitamin D  . Cholecalciferol (VITAMIN D) 2000 UNITS tablet Take 2,000 Units by mouth daily.  . Magnesium 400 MG TABS Take by mouth daily.  . Omega 3-6-9 Fatty Acids (OMEGA 3-6-9 COMPLEX PO) Take 1 tablet by mouth daily.  . B Complex-C (SUPER B COMPLEX PO) Take by mouth 2 (two) times daily.   . Estradiol (VAGIFEM VA) Place vaginally. Insert vaginally twice weekly   No current facility-administered medications on file prior to visit.     ROS: Review of Systems  Constitutional: Negative for chills,  fever, malaise/fatigue and weight loss.  HENT: Negative for congestion, ear discharge, ear pain, hearing loss, sinus pain, sore throat and tinnitus.   Eyes: Negative for blurred vision and double vision.  Respiratory: Negative for cough, shortness of breath and wheezing.   Cardiovascular: Negative for chest pain, palpitations, orthopnea, claudication and leg swelling.  Gastrointestinal: Negative for abdominal pain, blood in stool, constipation, diarrhea, heartburn, melena, nausea  and vomiting.  Genitourinary: Negative.   Musculoskeletal: Negative for joint pain and myalgias.  Skin: Negative for rash.  Neurological: Negative for dizziness, tingling, sensory change, weakness and headaches.  Endo/Heme/Allergies: Negative for polydipsia.  Psychiatric/Behavioral: Negative for depression, substance abuse and suicidal ideas. The patient is nervous/anxious and has insomnia.   All other systems reviewed and are negative.    Physical Exam:  BP 130/80   Pulse 94   Temp 98.1 F (36.7 C)   Ht 5\' 8"  (1.727 m)   Wt 171 lb (77.6 kg)   SpO2 96%   BMI 26.00 kg/m   General Appearance: Well nourished, in no apparent distress. Eyes: PERRLA, EOMs, conjunctiva no swelling or erythema Sinuses: No Frontal/maxillary tenderness ENT/Mouth: Ext aud canals clear, TMs without erythema, bulging. Fluid effusion to R ear. No erythema, swelling, or exudate on post pharynx.  Tonsils not swollen or erythematous. Hearing normal.  Neck: Supple, thyroid normal.  Respiratory: Respiratory effort normal, BS equal bilaterally without rales, rhonchi, wheezing or stridor.  Cardio: Somewhat tachycardic with normal R1/2 with no MRGs. Brisk peripheral pulses without edema.  Abdomen: Soft, + BS.  Non tender, no guarding, rebound, hernias, masses. Lymphatics: Non tender without lymphadenopathy.  Musculoskeletal: Full ROM, 5/5 strength, normal gait.  Skin: Warm, dry without rashes, lesions, ecchymosis.  Neuro: Cranial nerves intact. Normal muscle tone, no cerebellar symptoms. Sensation intact.  Psych: Awake and oriented X 3, anxious affect, Insight and Judgment appropriate.     Dan MakerAshley C Maedell Hedger, NP 10:20 AM [facility]

## 2017-05-25 NOTE — Progress Notes (Signed)
Complete Physical  Assessment and Plan:  Mixed hyperlipidemia -continue medications, check lipids, decrease fatty foods, increase activity.  -     Lipid panel  Elevated blood pressure reading without diagnosis of hypertension - continue medications, DASH diet, exercise and monitor at home. Call if greater than 130/80.  -     CBC with Differential/Platelet -     BASIC METABOLIC PANEL WITH GFR -     Hepatic function panel -     TSH -     Urinalysis, Routine w reflex microscopic -     Microalbumin / creatinine urine ratio -     EKG 12-Lead  Prediabetes Discussed disease progression and risks Discussed diet/exercise, weight management and risk modification  Vitamin D deficiency -     VITAMIN D 25 Hydroxy (Vit-D Deficiency, Fractures)  Sjogren's syndrome, with unspecified organ involvement (HCC) Continue follow up  Other migraine without status migrainosus, not intractable Avoid triggers  VSD (ventricular septal defect), perimembranous Monitoring  Rectocele, female Monitoring  Osteopenia, unspecified location DEXA follow up  Cystocele with prolapse Monitoring  Anxiety Continue buspar  Acute effusion of right ear -     Ambulatory referral to ENT  Pulsatile tinnitus of right ear with HA, imbalance, rule out cerebral anerusym Patient with family history in mother and father Has history of sjogrens and VSD -     Ambulatory referral to ENT -     MR MRA HEAD W WO CONTRAST; Future  Depression, major, recurrent, in partial remission (HCC) Continue lexapro for now and buspar, may switch to celexa or wellbutrin after ear evaluatoin  Medication management -     Magnesium  Screening, anemia, deficiency, iron -     Iron,Total/Total Iron Binding Cap -     Vitamin B12   Discussed med's effects and SE's. Screening labs and tests as requested with regular follow-up as recommended.  Future Appointments  Date Time Provider Department Center  06/03/2018 10:00 AM Quentin Mullingollier,  Breshay Ilg, PA-C [facility] None     HPI  62 y.o. female  presents for a complete physical.  She states she has felt sick x fall, she has had sinus issues, sore throat, but now has stopped up right ear x 6 weeks, was seen 04/27/2017 given prednisone and allergy pill and flonase that did not help. She denies decreased hearing but has pulsatile tinnitus, worse with morning and laying on left side, some dizziness/imbalance and some HA very short lived. She stopped the flonase due to cataract. Denies fever, chills.   She feels the lexapro is not helping but her daughter feels that it is and she would like to continue it for a bit longer, she is on buspar and states that is helping.   Her blood pressure has been controlled at home, today their BP is BP: 124/86.  She does not workout. She denies chest pain, shortness of breath, dizziness.   She is not on cholesterol medication and denies myalgias. Her cholesterol is at goal. The cholesterol last visit was:  Lab Results  Component Value Date   CHOL 158 05/26/2016   HDL 68 05/26/2016   LDLCALC 77 05/26/2016   TRIG 65 05/26/2016   CHOLHDL 2.3 05/26/2016  . She has been working on diet and exercise for prediabetes, she is not on bASA, she is not on ACE/ARB and denies foot ulcerations, hyperglycemia, hypoglycemia , increased appetite, nausea, paresthesia of the feet, polydipsia, polyuria, visual disturbances, vomiting and weight loss. Last A1C in the office was:  Lab  Results  Component Value Date   HGBA1C 5.5 05/26/2016   Patient is on Vitamin D supplement.   Lab Results  Component Value Date   VD25OH 40 05/24/2015     She has sjogrens that is being monitored.   She does still see Dr. Jennette Kettle yearly.  He is handling her pap smears, estrogen use and also her dexa scans.  BMI is Body mass index is 25.64 kg/m., she is working on diet and exercise. Wt Readings from Last 3 Encounters:  05/26/17 168 lb 9.6 oz (76.5 kg)  04/27/17 171 lb (77.6 kg)   05/26/16 182 lb 6.4 oz (82.7 kg)     Current Medications:  Current Outpatient Medications on File Prior to Visit  Medication Sig Dispense Refill  . B Complex-C (SUPER B COMPLEX PO) Take by mouth 2 (two) times daily.     . busPIRone (BUSPAR) 10 MG tablet Take 1/2-1 tab up to three times a day as needed for anxiety. 90 tablet 0  . Calcium Carbonate-Vitamin D (CALTRATE 600+D PO) Take by mouth 2 (two) times daily. Patient states 630 mg calcium and 500 IU Vitamin D    . Cholecalciferol (VITAMIN D) 2000 UNITS tablet Take 2,000 Units by mouth daily.    Marland Kitchen escitalopram (LEXAPRO) 10 MG tablet Take 1 tablet (10 mg total) by mouth daily. 30 tablet 2  . Magnesium 400 MG TABS Take by mouth daily.    . Omega 3-6-9 Fatty Acids (OMEGA 3-6-9 COMPLEX PO) Take 1 tablet by mouth daily.     No current facility-administered medications on file prior to visit.     Health Maintenance:   Immunization History  Administered Date(s) Administered  . Influenza, Seasonal, Injecte, Preservative Fre 05/26/2016  . Influenza-Unspecified 01/25/2013, 02/09/2014  . PPD Test 04/19/2013, 05/17/2014  . Pneumococcal Polysaccharide-23 04/19/2013  . Tdap 08/20/2010    Tetanus: 2012 Pneumovax:  2014 Flu vaccine: 2018 Pap: Dr. Jennette Kettle MGM: Dr. Marcelina Morel office [id number] Colonoscopy: 2012 Last Dental Exam:  Twice yearly visits Last Eye Exam: Dr. [place]i 2018  Patient Care Team: Lucky Cowboy, MD as PCP - General (Internal Medicine) [place], Heather, OD (Optometry) Freddy Finner, MD as Consulting Physician (Obstetrics and Gynecology) Lurena Nida, MD as Consulting Physician (Rheumatology) Charlott Rakes, MD as Consulting Physician (Gastroenterology) Thurmon Fair, MD as Consulting Physician (Cardiology) Nyoka Cowden, MD as Consulting Physician (Pulmonary Disease) Thurmon Fair, MD as Consulting Physician (Cardiology) [place], Amy, MD as Consulting Physician (Dermatology)  Medical History:  Past Medical  History:  Diagnosis Date  . Anemia   . GERD (gastroesophageal reflux disease)   . Migraines   . Osteopenia   . Sjogren's disease (HCC)   . Unspecified vitamin D deficiency   . VSD (ventricular septal defect), perimembranous    Allergies No Known Allergies  SURGICAL HISTORY She  has a past surgical history that includes Esophagogastroduodenoscopy (01/28/13); Dilation and curettage of uterus; Ablation on endometriosis; cervical conization (1976); and lip biopsy. FAMILY HISTORY Her family history includes ALS in her other; Bladder Cancer in her father; Breast cancer in her cousin; Hypertension in her father and mother; Multiple sclerosis in her cousin; Ovarian cancer in her maternal aunt; Parkinson's disease in her cousin; Stroke in her father and mother; Uterine cancer in her maternal grandmother. SOCIAL HISTORY She  reports that  has never smoked. she has never used smokeless tobacco. She reports that she drinks alcohol. She reports that she does not use drugs.   Review of Systems: Review of Systems  Constitutional:  Negative for chills, fever and malaise/fatigue.  HENT: Positive for ear pain and tinnitus. Negative for congestion, ear discharge, hearing loss, nosebleeds, sinus pain and sore throat.   Eyes: Negative.        Dry eye  Respiratory: Negative for cough, shortness of breath, wheezing and stridor.   Cardiovascular: Negative for chest pain, palpitations and leg swelling.  Gastrointestinal: Negative for abdominal pain, blood in stool, constipation, diarrhea, heartburn and melena.  Genitourinary: Negative.   Musculoskeletal: Negative.   Skin: Negative.   Neurological: Positive for dizziness and headaches. Negative for tingling, tremors, sensory change, speech change, focal weakness, seizures and loss of consciousness.  Psychiatric/Behavioral: Positive for depression. The patient is not nervous/anxious and does not have insomnia.     Physical Exam: Estimated body mass index is  25.64 kg/m as calculated from the following:   Height as of this encounter: 5\' 8"  (1.727 m).   Weight as of this encounter: 168 lb 9.6 oz (76.5 kg). BP 124/86   Pulse 68   Temp 97.6 F (36.4 C)   Resp 16   Ht 5\' 8"  (1.727 m)   Wt 168 lb 9.6 oz (76.5 kg)   SpO2 98%   BMI 25.64 kg/m   General Appearance: Well nourished well developed, in no apparent distress.  Eyes: PERRLA, EOMs, conjunctiva no swelling or erythema ENT/Mouth: Ear canals normal without obstruction, swelling, erythema, or discharge.  TMs normal left ear with no erythema, bulging, retraction, or loss of landmarks but right ear with bulging, yellow fluid behind TM, no pus, no erythema.  Oropharynx moist and clear with no exudate, erythema, or swelling.  Neck: Supple, thyroid normal. No bruits.  No cervical adenopathy Respiratory: Respiratory effort normal, Breath sounds clear A&P without wheeze, rhonchi, rales.   Cardio: RRR with holosystolic low pitched murmur heard best on the L 3rd ICS, no rubs or gallops. Brisk peripheral pulses without edema.  Chest: symmetric, with normal excursions Breasts: Deferred to obgyn.  Abdomen: Soft, nontender, no guarding, rebound, hernias, masses, or organomegaly.  Lymphatics: Non tender without lymphadenopathy. Musculoskeletal: Full ROM all peripheral extremities,5/5 strength, and normal gait.  Skin: Warm, dry without rashes, lesions, ecchymosis. Neuro: Awake and oriented X 3, Cranial nerves intact, reflexes equal bilaterally. Normal muscle tone, no cerebellar symptoms. Sensation intact.  Psych:  normal affect, Insight and Judgment appropriate.   EKG: WNL no changes.         Over 40 minutes of exam, counseling, chart review and critical decision making was performed  Quentin Mulling 10:06 AM [facility]

## 2017-05-26 ENCOUNTER — Ambulatory Visit (INDEPENDENT_AMBULATORY_CARE_PROVIDER_SITE_OTHER): Payer: BLUE CROSS/BLUE SHIELD | Admitting: Physician Assistant

## 2017-05-26 ENCOUNTER — Encounter: Payer: Self-pay | Admitting: Physician Assistant

## 2017-05-26 ENCOUNTER — Encounter: Payer: Self-pay | Admitting: Internal Medicine

## 2017-05-26 VITALS — BP 124/86 | HR 68 | Temp 97.6°F | Resp 16 | Ht 68.0 in | Wt 168.6 lb

## 2017-05-26 DIAGNOSIS — R030 Elevated blood-pressure reading, without diagnosis of hypertension: Secondary | ICD-10-CM | POA: Diagnosis not present

## 2017-05-26 DIAGNOSIS — M3500 Sicca syndrome, unspecified: Secondary | ICD-10-CM

## 2017-05-26 DIAGNOSIS — M8580 Other specified disorders of bone density and structure, unspecified site: Secondary | ICD-10-CM

## 2017-05-26 DIAGNOSIS — E782 Mixed hyperlipidemia: Secondary | ICD-10-CM

## 2017-05-26 DIAGNOSIS — Z0000 Encounter for general adult medical examination without abnormal findings: Secondary | ICD-10-CM | POA: Diagnosis not present

## 2017-05-26 DIAGNOSIS — H65191 Other acute nonsuppurative otitis media, right ear: Secondary | ICD-10-CM

## 2017-05-26 DIAGNOSIS — Z136 Encounter for screening for cardiovascular disorders: Secondary | ICD-10-CM

## 2017-05-26 DIAGNOSIS — I671 Cerebral aneurysm, nonruptured: Secondary | ICD-10-CM

## 2017-05-26 DIAGNOSIS — F3341 Major depressive disorder, recurrent, in partial remission: Secondary | ICD-10-CM

## 2017-05-26 DIAGNOSIS — F419 Anxiety disorder, unspecified: Secondary | ICD-10-CM

## 2017-05-26 DIAGNOSIS — Z130 Encounter for screening for diseases of the blood and blood-forming organs and certain disorders involving the immune mechanism: Secondary | ICD-10-CM

## 2017-05-26 DIAGNOSIS — E559 Vitamin D deficiency, unspecified: Secondary | ICD-10-CM

## 2017-05-26 DIAGNOSIS — G43809 Other migraine, not intractable, without status migrainosus: Secondary | ICD-10-CM

## 2017-05-26 DIAGNOSIS — Z79899 Other long term (current) drug therapy: Secondary | ICD-10-CM

## 2017-05-26 DIAGNOSIS — H93A1 Pulsatile tinnitus, right ear: Secondary | ICD-10-CM

## 2017-05-26 DIAGNOSIS — I10 Essential (primary) hypertension: Secondary | ICD-10-CM | POA: Diagnosis not present

## 2017-05-26 DIAGNOSIS — N816 Rectocele: Secondary | ICD-10-CM

## 2017-05-26 DIAGNOSIS — Q210 Ventricular septal defect: Secondary | ICD-10-CM

## 2017-05-26 DIAGNOSIS — R7303 Prediabetes: Secondary | ICD-10-CM

## 2017-05-26 DIAGNOSIS — N814 Uterovaginal prolapse, unspecified: Secondary | ICD-10-CM

## 2017-05-26 NOTE — Addendum Note (Signed)
Addended by: Quentin MullingOLLIER, Kimila Papaleo R on: 05/26/2017 03:42 PM   Modules accepted: Orders

## 2017-05-26 NOTE — Patient Instructions (Signed)
Tinnitus Tinnitus refers to hearing a sound when there is no actual source for that sound. This is often described as ringing in the ears. However, people with this condition may hear a variety of noises. A person may hear the sound in one ear or in both ears. The sounds of tinnitus can be soft, loud, or somewhere in between. Tinnitus can last for a few seconds or can be constant for days. It may go away without treatment and come back at various times. When tinnitus is constant or happens often, it can lead to other problems, such as trouble sleeping and trouble concentrating. Almost everyone experiences tinnitus at some point. Tinnitus that is long-lasting (chronic) or comes back often is a problem that may require medical attention. What are the causes? The cause of tinnitus is often not known. In some cases, it can result from other problems or conditions, including:  Exposure to loud noises from machinery, music, or other sources.  Hearing loss.  Ear or sinus infections.  Earwax buildup.  A foreign object in the ear.  Use of certain medicines.  Use of alcohol and caffeine.  High blood pressure.  Heart diseases.  Anemia.  Allergies.  Meniere disease.  Thyroid problems.  Tumors.  An enlarged part of a weakened blood vessel (aneurysm).  What are the signs or symptoms? The main symptom of tinnitus is hearing a sound when there is no source for that sound. It may sound like:  Buzzing.  Roaring.  Ringing.  Blowing air, similar to the sound heard when you listen to a seashell.  Hissing.  Whistling.  Sizzling.  Humming.  Running water.  A sustained musical note.  How is this diagnosed? Tinnitus is diagnosed based on your symptoms. Your health care provider will do a physical exam. A comprehensive hearing exam (audiologic exam) will be done if your tinnitus:  Affects only one ear (unilateral).  Causes hearing difficulties.  Lasts 6 months or  longer.  You may also need to see a health care provider who specializes in hearing disorders (audiologist). You may be asked to complete a questionnaire to determine the severity of your tinnitus. Tests may be done to help determine the cause and to rule out other conditions. These can include:  Imaging studies of your head and brain, such as: ? A CT scan. ? An MRI.  An imaging study of your blood vessels (angiogram).  How is this treated? Treating an underlying medical condition can sometimes make tinnitus go away. If your tinnitus continues, other treatments may include:  Medicines, such as certain antidepressants or sleeping aids.  Sound generators to mask the tinnitus. These include: ? Tabletop sound machines that play relaxing sounds to help you fall asleep. ? Wearable devices that fit in your ear and play sounds or music. ? A small device that uses headphones to deliver a signal embedded in music (acoustic neural stimulation). In time, this may change the pathways of your brain and make you less sensitive to tinnitus. This device is used for very severe cases when no other treatment is working.  Therapy and counseling to help you manage the stress of living with tinnitus.  Using hearing aids or cochlear implants, if your tinnitus is related to hearing loss.  Follow these instructions at home:  When possible, avoid being in loud places and being exposed to loud sounds.  Wear hearing protection, such as earplugs, when you are exposed to loud noises.  Do not take stimulants, such as nicotine,   alcohol, or caffeine.  Practice techniques for reducing stress, such as meditation, yoga, or deep breathing.  Use a white noise machine, a humidifier, or other devices to mask the sound of tinnitus.  Sleep with your head slightly raised. This may reduce the impact of tinnitus.  Try to get plenty of rest each night. Contact a health care provider if:  You have tinnitus in just one  ear.  Your tinnitus continues for 3 weeks or longer without stopping.  Home care measures are not helping.  You have tinnitus after a head injury.  You have tinnitus along with any of the following: ? Dizziness. ? Loss of balance. ? Nausea and vomiting. This information is not intended to replace advice given to you by your health care provider. Make sure you discuss any questions you have with your health care provider. Document Released: 04/28/2005 Document Revised: 12/30/2015 Document Reviewed: 09/28/2013 Elsevier Interactive Patient Education  2018 Elsevier Inc.  

## 2017-05-27 ENCOUNTER — Ambulatory Visit
Admission: RE | Admit: 2017-05-27 | Discharge: 2017-05-27 | Disposition: A | Discharge status: Home / Self Care | Payer: BLUE CROSS/BLUE SHIELD | Source: Ambulatory Visit | Attending: Physician Assistant | Admitting: Physician Assistant

## 2017-05-27 ENCOUNTER — Other Ambulatory Visit: Payer: Self-pay | Admitting: Physician Assistant

## 2017-05-27 DIAGNOSIS — I6522 Occlusion and stenosis of left carotid artery: Secondary | ICD-10-CM

## 2017-05-27 DIAGNOSIS — I671 Cerebral aneurysm, nonruptured: Secondary | ICD-10-CM

## 2017-05-27 LAB — BASIC METABOLIC PANEL WITH GFR
BUN: 7 mg/dL (ref 7–25)
CO2: 27 mmol/L (ref 20–32)
CREATININE: 0.74 mg/dL (ref 0.50–0.99)
Calcium: 9.6 mg/dL (ref 8.6–10.4)
Chloride: 97 mmol/L — ABNORMAL LOW (ref 98–110)
GFR, EST NON AFRICAN AMERICAN: 86 mL/min/{1.73_m2} (ref >=60)
GFR, Est African American: 100 mL/min/{1.73_m2} (ref >=60)
Glucose, Bld: 90 mg/dL (ref 65–99)
POTASSIUM: 4.2 mmol/L (ref 3.5–5.3)
SODIUM: 134 mmol/L — AB (ref 135–146)

## 2017-05-27 LAB — CBC WITH DIFFERENTIAL/PLATELET
BASOS ABS: 69 {cells}/uL (ref 0–200)
Basophils Relative: 0.9 %
EOS ABS: 46 {cells}/uL (ref 15–500)
Eosinophils Relative: 0.6 %
HEMATOCRIT: 37.7 % (ref 35.0–45.0)
Hemoglobin: 12.8 g/dL (ref 11.7–15.5)
LYMPHS ABS: 1656 {cells}/uL (ref 850–3900)
MCH: 29.4 pg (ref 27.0–33.0)
MCHC: 34 g/dL (ref 32.0–36.0)
MCV: 86.5 fL (ref 80.0–100.0)
MONOS PCT: 8 %
MPV: 9.4 fL (ref 7.5–12.5)
NEUTROS PCT: 69 %
Neutro Abs: 5313 cells/uL (ref 1500–7800)
Platelets: 344 10*3/uL (ref 140–400)
RBC: 4.36 10*6/uL (ref 3.80–5.10)
RDW: 13.2 % (ref 11.0–15.0)
Total Lymphocyte: 21.5 %
WBC mixed population: 616 cells/uL (ref 200–950)
WBC: 7.7 10*3/uL (ref 3.8–10.8)

## 2017-05-27 LAB — IRON, TOTAL/TOTAL IRON BINDING CAP
%SAT: 20 % (calc) (ref 11–50)
Iron: 60 ug/dL (ref 45–160)
TIBC: 297 ug/dL (ref 250–450)

## 2017-05-27 LAB — URINALYSIS, ROUTINE W REFLEX MICROSCOPIC
BACTERIA UA: NONE SEEN /HPF
BILIRUBIN URINE: NEGATIVE
GLUCOSE, UA: NEGATIVE
HYALINE CAST: NONE SEEN /LPF
Hgb urine dipstick: NEGATIVE
Ketones, ur: NEGATIVE
Nitrite: NEGATIVE
PH: 7 (ref 5.0–8.0)
Protein, ur: NEGATIVE
RBC / HPF: NONE SEEN /HPF (ref 0–2)
SPECIFIC GRAVITY, URINE: 1.012 (ref 1.001–1.03)
SQUAMOUS EPITHELIAL / LPF: NONE SEEN /HPF (ref <=5)
WBC UA: NONE SEEN /HPF (ref 0–5)

## 2017-05-27 LAB — HEPATIC FUNCTION PANEL
AG RATIO: 1.9 (calc) (ref 1.0–2.5)
ALKALINE PHOSPHATASE (APISO): 69 U/L (ref 33–130)
ALT: 17 U/L (ref 6–29)
AST: 17 U/L (ref 10–35)
Albumin: 4.6 g/dL (ref 3.6–5.1)
BILIRUBIN INDIRECT: 0.5 mg/dL (ref 0.2–1.2)
BILIRUBIN TOTAL: 0.6 mg/dL (ref 0.2–1.2)
Bilirubin, Direct: 0.1 mg/dL (ref 0.0–0.2)
GLOBULIN: 2.4 g/dL (ref 1.9–3.7)
Total Protein: 7 g/dL (ref 6.1–8.1)

## 2017-05-27 LAB — LIPID PANEL
CHOL/HDL RATIO: 2.9 (calc) (ref <5.0)
Cholesterol: 197 mg/dL (ref <200)
HDL: 69 mg/dL (ref >50)
LDL CHOLESTEROL (CALC): 106 mg/dL — AB
NON-HDL CHOLESTEROL (CALC): 128 mg/dL (ref <130)
Triglycerides: 122 mg/dL (ref <150)

## 2017-05-27 LAB — MICROALBUMIN / CREATININE URINE RATIO
CREATININE, URINE: 87 mg/dL (ref 20–275)
Microalb Creat Ratio: 5 mcg/mg creat (ref <30)
Microalb, Ur: 0.4 mg/dL

## 2017-05-27 LAB — MAGNESIUM: Magnesium: 1.9 mg/dL (ref 1.5–2.5)

## 2017-05-27 LAB — VITAMIN D 25 HYDROXY (VIT D DEFICIENCY, FRACTURES): Vit D, 25-Hydroxy: 35 ng/mL (ref 30–100)

## 2017-05-27 LAB — TSH: TSH: 3.52 mIU/L (ref 0.40–4.50)

## 2017-05-27 LAB — VITAMIN B12: VITAMIN B 12: 286 pg/mL (ref 200–1100)

## 2017-05-28 DIAGNOSIS — H6521 Chronic serous otitis media, right ear: Secondary | ICD-10-CM | POA: Diagnosis not present

## 2017-06-01 ENCOUNTER — Ambulatory Visit (HOSPITAL_COMMUNITY)
Admission: RE | Admit: 2017-06-01 | Discharge: 2017-06-01 | Disposition: A | Discharge status: Home / Self Care | Payer: BLUE CROSS/BLUE SHIELD | Source: Ambulatory Visit | Attending: Surgery | Admitting: Surgery

## 2017-06-01 DIAGNOSIS — I6522 Occlusion and stenosis of left carotid artery: Secondary | ICD-10-CM | POA: Diagnosis not present

## 2017-06-01 LAB — VAS US CAROTID
LCCADDIAS: -25 cm/s
LEFT ECA DIAS: -14 cm/s
LEFT VERTEBRAL DIAS: 27 cm/s
LICADDIAS: -31 cm/s
LICAPDIAS: -19 cm/s
LICAPSYS: -78 cm/s
Left CCA dist sys: -105 cm/s
Left CCA prox dias: 22 cm/s
Left CCA prox sys: 114 cm/s
Left ICA dist sys: -90 cm/s
RCCADSYS: -101 cm/s
RIGHT CCA MID DIAS: -24 cm/s
RIGHT ECA DIAS: -11 cm/s
RIGHT VERTEBRAL DIAS: 10 cm/s
Right CCA prox dias: 24 cm/s
Right CCA prox sys: 96 cm/s

## 2017-06-04 ENCOUNTER — Encounter: Payer: Self-pay | Admitting: *Deleted

## 2017-07-26 ENCOUNTER — Other Ambulatory Visit: Payer: Self-pay | Admitting: Adult Health

## 2017-07-26 DIAGNOSIS — F419 Anxiety disorder, unspecified: Secondary | ICD-10-CM

## 2017-10-15 DIAGNOSIS — H9313 Tinnitus, bilateral: Secondary | ICD-10-CM | POA: Diagnosis not present

## 2017-10-22 DIAGNOSIS — M3500 Sicca syndrome, unspecified: Secondary | ICD-10-CM | POA: Diagnosis not present

## 2017-10-22 DIAGNOSIS — M542 Cervicalgia: Secondary | ICD-10-CM | POA: Diagnosis not present

## 2018-02-02 DIAGNOSIS — H6993 Unspecified Eustachian tube disorder, bilateral: Secondary | ICD-10-CM | POA: Diagnosis not present

## 2018-04-20 DIAGNOSIS — H6993 Unspecified Eustachian tube disorder, bilateral: Secondary | ICD-10-CM | POA: Diagnosis not present

## 2018-04-20 DIAGNOSIS — H722X1 Other marginal perforations of tympanic membrane, right ear: Secondary | ICD-10-CM | POA: Diagnosis not present

## 2018-05-18 DIAGNOSIS — D225 Melanocytic nevi of trunk: Secondary | ICD-10-CM | POA: Diagnosis not present

## 2018-05-18 DIAGNOSIS — Z85828 Personal history of other malignant neoplasm of skin: Secondary | ICD-10-CM | POA: Diagnosis not present

## 2018-05-18 DIAGNOSIS — L821 Other seborrheic keratosis: Secondary | ICD-10-CM | POA: Diagnosis not present

## 2018-05-18 DIAGNOSIS — D2261 Melanocytic nevi of right upper limb, including shoulder: Secondary | ICD-10-CM | POA: Diagnosis not present

## 2018-05-18 DIAGNOSIS — L570 Actinic keratosis: Secondary | ICD-10-CM | POA: Diagnosis not present

## 2018-05-31 NOTE — Progress Notes (Signed)
Complete Physical  Assessment and Plan:  Mixed hyperlipidemia -continue medications, check lipids, decrease fatty foods, increase activity.  -     Lipid panel  Elevated blood pressure reading without diagnosis of hypertension - continue medications, DASH diet, exercise and monitor at home. Call if greater than 130/80.  -     CBC with Differential/Platelet -     BASIC METABOLIC PANEL WITH GFR -     Hepatic function panel -     TSH -     Urinalysis, Routine w reflex microscopic -     Microalbumin / creatinine urine ratio -     EKG 12-Lead  Prediabetes Discussed disease progression and risks Discussed diet/exercise, weight management and risk modification  Vitamin D deficiency -     VITAMIN D 25 Hydroxy (Vit-D Deficiency, Fractures)  Sjogren's syndrome, with unspecified organ involvement (HCC) Continue follow up  Other migraine without status migrainosus, not intractable Avoid triggers  VSD (ventricular septal defect), perimembranous Monitoring  Rectocele, female Monitoring  Osteopenia, unspecified location DEXA follow up  Cystocele with prolapse Monitoring  Anxiety Continue buspar  Acute effusion of right ear -     Ambulatory referral to ENT  Depression, major, recurrent, in partial remission (HCC) Continue lexapro for now and buspar, may switch to celexa or wellbutrin after ear evaluatoin  Medication management -     Magnesium  Screening, anemia, deficiency, iron -     Iron,Total/Total Iron Binding Cap -     Vitamin B12   Discussed med's effects and SE's. Screening labs and tests as requested with regular follow-up as recommended.  Future Appointments  Date Time Provider Department Center  06/08/2019 10:00 AM Quentin Mulling, PA-C [facility] None     HPI  63 y.o. married WF  presents for a complete physical.  She is off buspar and lexapro, feels she is doing well.   Her blood pressure has been controlled at home, at home it is normal, today  their BP is BP: 140/76.  She does not workout. She denies chest pain, shortness of breath, dizziness.   Had tube in right ear, recently removed in Sept with Dr. Ezzard Standing.  She is not on cholesterol medication and denies myalgias. Her cholesterol is at goal. The cholesterol last visit was:  Lab Results  Component Value Date   CHOL 197 05/26/2017   HDL 69 05/26/2017   LDLCALC 106 (H) 05/26/2017   TRIG 122 05/26/2017   CHOLHDL 2.9 05/26/2017  . She has been working on diet and exercise for prediabetes, she is not on bASA, she is not on ACE/ARB and denies foot ulcerations, hyperglycemia, hypoglycemia , increased appetite, nausea, paresthesia of the feet, polydipsia, polyuria, visual disturbances, vomiting and weight loss. Last A1C in the office was:  Lab Results  Component Value Date   HGBA1C 5.5 05/26/2016   Patient is on Vitamin D supplement.   Lab Results  Component Value Date   VD25OH 35 05/26/2017     She has sjogrens that is being monitored.   She does still see Dr. Jennette Kettle yearly.  He is handling her pap smears 12/2015, estrogen use and also her dexa scans.  BMI is Body mass index is 26.18 kg/m., she is working on diet and exercise. Wt Readings from Last 3 Encounters:  06/03/18 172 lb 3.2 oz (78.1 kg)  05/26/17 168 lb 9.6 oz (76.5 kg)  04/27/17 171 lb (77.6 kg)     Current Medications:  Current Outpatient Medications on File Prior to Visit  Medication Sig Dispense Refill  . B Complex-C (SUPER B COMPLEX PO) Take by mouth 2 (two) times daily.     . Calcium Carbonate-Vitamin D (CALTRATE 600+D PO) Take by mouth 2 (two) times daily. Patient states 630 mg calcium and 500 IU Vitamin D    . Cholecalciferol (VITAMIN D) 2000 UNITS tablet Take 2,000 Units by mouth daily.    . Magnesium 400 MG TABS Take by mouth daily.    . Omega 3-6-9 Fatty Acids (OMEGA 3-6-9 COMPLEX PO) Take 1 tablet by mouth daily.     No current facility-administered medications on file prior to visit.      Health Maintenance:   Immunization History  Administered Date(s) Administered  . Influenza, Seasonal, Injecte, Preservative Fre 05/26/2016  . Influenza-Unspecified 01/25/2013, 02/09/2014  . PPD Test 04/19/2013, 05/17/2014  . Pneumococcal Polysaccharide-23 04/19/2013  . Tdap 08/20/2010    Tetanus: 2012 Pneumovax:  2014 Flu vaccine: 2019 Pap: Dr. Jennette Kettle MGM: 2017 DUE [id number] Colonoscopy: 2012 US carotid 06/01/2017-- right ICA 60-79%, left 1-39%.  MRA head for pulsatile tinnitus negative Last Dental Exam:  Twice yearly visits Last Eye Exam: Dr. [place] 2018  Patient Care Team: Lucky Cowboy, MD as PCP - General (Internal Medicine) [place], Heather, OD (Optometry) Freddy Finner, MD as Consulting Physician (Obstetrics and Gynecology) Charlott Rakes, MD as Consulting Physician (Gastroenterology) Thurmon Fair, MD as Consulting Physician (Cardiology) [place], Amy, MD as Consulting Physician (Dermatology) Rossie Muskrat, MD as Consulting Physician (Rheumatology)  Medical History:  Past Medical History:  Diagnosis Date  . Anemia   . GERD (gastroesophageal reflux disease)   . Migraines   . Osteopenia   . Sjogren's disease (HCC)   . Unspecified vitamin D deficiency   . VSD (ventricular septal defect), perimembranous    Allergies No Known Allergies  SURGICAL HISTORY She  has a past surgical history that includes Esophagogastroduodenoscopy (01/28/13); Dilation and curettage of uterus; Ablation on endometriosis; cervical conization (1976); and lip biopsy.   FAMILY HISTORY Her family history includes ALS in an other family member; Bladder Cancer in her father; Breast cancer in her cousin; Hypertension in her father and mother; Multiple sclerosis in her cousin; Ovarian cancer in her maternal aunt; Parkinson's disease in her cousin; Stroke in her father and mother; Uterine cancer in her maternal grandmother.   SOCIAL HISTORY She  reports that she has never smoked. She has  never used smokeless tobacco. She reports current alcohol use. She reports that she does not use drugs.   Review of Systems: Review of Systems  Constitutional: Negative for chills, fever and malaise/fatigue.  HENT: Positive for ear pain and tinnitus. Negative for congestion, ear discharge, hearing loss, nosebleeds, sinus pain and sore throat.   Eyes: Negative.        Dry eye  Respiratory: Negative for cough, shortness of breath, wheezing and stridor.   Cardiovascular: Negative for chest pain, palpitations and leg swelling.  Gastrointestinal: Negative for abdominal pain, blood in stool, constipation, diarrhea, heartburn and melena.  Genitourinary: Negative.   Musculoskeletal: Negative.   Skin: Negative.   Neurological: Positive for dizziness and headaches. Negative for tingling, tremors, sensory change, speech change, focal weakness, seizures and loss of consciousness.  Psychiatric/Behavioral: Positive for depression. The patient is not nervous/anxious and does not have insomnia.     Physical Exam: Estimated body mass index is 26.18 kg/m as calculated from the following:   Height as of this encounter: 5\' 8"  (1.727 m).   Weight as of this encounter: 172 lb  3.2 oz (78.1 kg). BP 140/76   Pulse (!) 102   Temp 98.6 F (37 C)   Ht 5\' 8"  (1.727 m)   Wt 172 lb 3.2 oz (78.1 kg)   SpO2 96%   BMI 26.18 kg/m   General Appearance: Well nourished well developed, in no apparent distress.  Eyes: PERRLA, EOMs, conjunctiva no swelling or erythema ENT/Mouth: Ear canals normal without obstruction, swelling, erythema, or discharge.  TMs normal left ear with no erythema, bulging, retraction, or loss of landmarks but right ear with hole in TM at 10 oclock, no pus or discharge.  Oropharynx moist and clear with no exudate, erythema, or swelling.  Neck: Supple, thyroid normal. No bruits.  No cervical adenopathy Respiratory: Respiratory effort normal, Breath sounds clear A&P without wheeze, rhonchi, rales.    Cardio: RRR with holosystolic low pitched murmur heard best on the L 3rd ICS, no rubs or gallops. Brisk peripheral pulses without edema.  Chest: symmetric, with normal excursions Breasts: Deferred to obgyn.  Abdomen: Soft, nontender, no guarding, rebound, hernias, masses, or organomegaly.  Lymphatics: Non tender without lymphadenopathy. Musculoskeletal: Full ROM all peripheral extremities,5/5 strength, and normal gait.  Skin: Warm, dry without rashes, lesions, ecchymosis. Neuro: Awake and oriented X 3, Cranial nerves intact, reflexes equal bilaterally. Normal muscle tone, no cerebellar symptoms. Sensation intact.  Psych:  normal affect, Insight and Judgment appropriate.   EKG: WNL, no ST changes    Over 40 minutes of exam, counseling, chart review and critical decision making was performed  Quentin MullingAmanda Juanette Urizar 10:11 AM [facility]

## 2018-06-03 ENCOUNTER — Ambulatory Visit (INDEPENDENT_AMBULATORY_CARE_PROVIDER_SITE_OTHER): Payer: BLUE CROSS/BLUE SHIELD | Admitting: Physician Assistant

## 2018-06-03 ENCOUNTER — Encounter: Payer: Self-pay | Admitting: Physician Assistant

## 2018-06-03 VITALS — BP 140/76 | HR 102 | Temp 98.6°F | Ht 68.0 in | Wt 172.2 lb

## 2018-06-03 DIAGNOSIS — Z0000 Encounter for general adult medical examination without abnormal findings: Secondary | ICD-10-CM

## 2018-06-03 DIAGNOSIS — R030 Elevated blood-pressure reading, without diagnosis of hypertension: Secondary | ICD-10-CM | POA: Diagnosis not present

## 2018-06-03 DIAGNOSIS — Z79899 Other long term (current) drug therapy: Secondary | ICD-10-CM | POA: Diagnosis not present

## 2018-06-03 DIAGNOSIS — E559 Vitamin D deficiency, unspecified: Secondary | ICD-10-CM

## 2018-06-03 DIAGNOSIS — R7303 Prediabetes: Secondary | ICD-10-CM

## 2018-06-03 DIAGNOSIS — Z130 Encounter for screening for diseases of the blood and blood-forming organs and certain disorders involving the immune mechanism: Secondary | ICD-10-CM | POA: Diagnosis not present

## 2018-06-03 DIAGNOSIS — Z1389 Encounter for screening for other disorder: Secondary | ICD-10-CM | POA: Diagnosis not present

## 2018-06-03 DIAGNOSIS — Z13220 Encounter for screening for lipoid disorders: Secondary | ICD-10-CM

## 2018-06-03 DIAGNOSIS — N814 Uterovaginal prolapse, unspecified: Secondary | ICD-10-CM

## 2018-06-03 DIAGNOSIS — Z1329 Encounter for screening for other suspected endocrine disorder: Secondary | ICD-10-CM

## 2018-06-03 DIAGNOSIS — Z136 Encounter for screening for cardiovascular disorders: Secondary | ICD-10-CM

## 2018-06-03 DIAGNOSIS — Z131 Encounter for screening for diabetes mellitus: Secondary | ICD-10-CM | POA: Diagnosis not present

## 2018-06-03 DIAGNOSIS — G43809 Other migraine, not intractable, without status migrainosus: Secondary | ICD-10-CM

## 2018-06-03 DIAGNOSIS — E782 Mixed hyperlipidemia: Secondary | ICD-10-CM

## 2018-06-03 DIAGNOSIS — Q210 Ventricular septal defect: Secondary | ICD-10-CM

## 2018-06-03 DIAGNOSIS — M8580 Other specified disorders of bone density and structure, unspecified site: Secondary | ICD-10-CM

## 2018-06-03 DIAGNOSIS — N816 Rectocele: Secondary | ICD-10-CM

## 2018-06-03 DIAGNOSIS — M3500 Sicca syndrome, unspecified: Secondary | ICD-10-CM

## 2018-06-03 DIAGNOSIS — F419 Anxiety disorder, unspecified: Secondary | ICD-10-CM

## 2018-06-03 NOTE — Patient Instructions (Signed)
HOW TO SCHEDULE A MAMMOGRAM  [facility]  7 a.m.-6:30 p.m., Monday 7 a.m.-5 p.m., Tuesday-Friday Schedule an appointment by calling [phone].  GENERAL HEALTH GOALS  Know what a healthy weight is for you (roughly BMI <25) and aim to maintain this  Aim for 7+ servings of fruits and vegetables daily  70-80+ fluid ounces of water or unsweet tea for healthy kidneys  Limit to max 1 drink of alcohol per day; avoid smoking/tobacco  Limit animal fats in diet for cholesterol and heart health - choose grass fed whenever available  Avoid highly processed foods, and foods high in saturated/trans fats  Aim for low stress - take time to unwind and care for your mental health  Aim for 150 min of moderate intensity exercise weekly for heart health, and weights twice weekly for bone health  Aim for 7-9 hours of sleep daily

## 2018-06-04 LAB — MICROALBUMIN / CREATININE URINE RATIO
Creatinine, Urine: 58 mg/dL (ref 20–275)
MICROALB/CREAT RATIO: 16 ug/mg{creat} (ref <30)
Microalb, Ur: 0.9 mg/dL

## 2018-06-04 LAB — COMPLETE METABOLIC PANEL WITH GFR
AG Ratio: 1.8 (calc) (ref 1.0–2.5)
ALT: 15 U/L (ref 6–29)
AST: 17 U/L (ref 10–35)
Albumin: 4.8 g/dL (ref 3.6–5.1)
Alkaline phosphatase (APISO): 72 U/L (ref 33–130)
BILIRUBIN TOTAL: 0.6 mg/dL (ref 0.2–1.2)
BUN: 9 mg/dL (ref 7–25)
CO2: 28 mmol/L (ref 20–32)
Calcium: 10 mg/dL (ref 8.6–10.4)
Chloride: 96 mmol/L — ABNORMAL LOW (ref 98–110)
Creat: 0.67 mg/dL (ref 0.50–0.99)
GFR, Est African American: 108 mL/min/{1.73_m2} (ref >=60)
GFR, Est Non African American: 93 mL/min/{1.73_m2} (ref >=60)
Globulin: 2.7 g/dL (calc) (ref 1.9–3.7)
Glucose, Bld: 104 mg/dL — ABNORMAL HIGH (ref 65–99)
Potassium: 4.2 mmol/L (ref 3.5–5.3)
SODIUM: 133 mmol/L — AB (ref 135–146)
Total Protein: 7.5 g/dL (ref 6.1–8.1)

## 2018-06-04 LAB — MAGNESIUM: MAGNESIUM: 2 mg/dL (ref 1.5–2.5)

## 2018-06-04 LAB — URINALYSIS, ROUTINE W REFLEX MICROSCOPIC
Bacteria, UA: NONE SEEN /HPF
Bilirubin Urine: NEGATIVE
GLUCOSE, UA: NEGATIVE
HYALINE CAST: NONE SEEN /LPF
Hgb urine dipstick: NEGATIVE
Ketones, ur: NEGATIVE
Nitrite: NEGATIVE
Protein, ur: NEGATIVE
Specific Gravity, Urine: 1.01 (ref 1.001–1.03)
Squamous Epithelial / HPF: NONE SEEN /HPF (ref <=5)
WBC, UA: NONE SEEN /HPF (ref 0–5)
pH: 6 (ref 5.0–8.0)

## 2018-06-04 LAB — LIPID PANEL
Cholesterol: 198 mg/dL (ref <200)
HDL: 72 mg/dL (ref >50)
LDL Cholesterol (Calc): 108 mg/dL (calc) — ABNORMAL HIGH
Non-HDL Cholesterol (Calc): 126 mg/dL (calc) (ref <130)
Total CHOL/HDL Ratio: 2.8 (calc) (ref <5.0)
Triglycerides: 85 mg/dL (ref <150)

## 2018-06-04 LAB — CBC WITH DIFFERENTIAL/PLATELET
ABSOLUTE MONOCYTES: 608 {cells}/uL (ref 200–950)
Basophils Absolute: 72 cells/uL (ref 0–200)
Basophils Relative: 0.9 %
EOS ABS: 48 {cells}/uL (ref 15–500)
Eosinophils Relative: 0.6 %
HCT: 39.2 % (ref 35.0–45.0)
Hemoglobin: 13.2 g/dL (ref 11.7–15.5)
LYMPHS ABS: 1696 {cells}/uL (ref 850–3900)
MCH: 29.7 pg (ref 27.0–33.0)
MCHC: 33.7 g/dL (ref 32.0–36.0)
MCV: 88.1 fL (ref 80.0–100.0)
MPV: 9.6 fL (ref 7.5–12.5)
Monocytes Relative: 7.6 %
Neutro Abs: 5576 cells/uL (ref 1500–7800)
Neutrophils Relative %: 69.7 %
Platelets: 342 10*3/uL (ref 140–400)
RBC: 4.45 10*6/uL (ref 3.80–5.10)
RDW: 12.7 % (ref 11.0–15.0)
Total Lymphocyte: 21.2 %
WBC: 8 10*3/uL (ref 3.8–10.8)

## 2018-06-04 LAB — HEMOGLOBIN A1C
Hgb A1c MFr Bld: 5.6 % of total Hgb (ref <5.7)
Mean Plasma Glucose: 114 (calc)
eAG (mmol/L): 6.3 (calc)

## 2018-06-04 LAB — TSH: TSH: 4.01 mIU/L (ref 0.40–4.50)

## 2018-06-04 LAB — IRON, TOTAL/TOTAL IRON BINDING CAP
%SAT: 21 % (calc) (ref 16–45)
IRON: 69 ug/dL (ref 45–160)
TIBC: 329 mcg/dL (calc) (ref 250–450)

## 2018-06-04 LAB — VITAMIN D 25 HYDROXY (VIT D DEFICIENCY, FRACTURES): Vit D, 25-Hydroxy: 38 ng/mL (ref 30–100)

## 2018-06-04 LAB — VITAMIN B12: Vitamin B-12: 301 pg/mL (ref 200–1100)

## 2018-07-27 DIAGNOSIS — N841 Polyp of cervix uteri: Secondary | ICD-10-CM | POA: Diagnosis not present

## 2018-07-27 DIAGNOSIS — Z01419 Encounter for gynecological examination (general) (routine) without abnormal findings: Secondary | ICD-10-CM | POA: Diagnosis not present

## 2018-07-27 DIAGNOSIS — Z1231 Encounter for screening mammogram for malignant neoplasm of breast: Secondary | ICD-10-CM | POA: Diagnosis not present

## 2018-07-27 DIAGNOSIS — Z6826 Body mass index (BMI) 26.0-26.9, adult: Secondary | ICD-10-CM | POA: Diagnosis not present

## 2018-07-27 LAB — HM MAMMOGRAPHY

## 2018-08-02 ENCOUNTER — Ambulatory Visit (INDEPENDENT_AMBULATORY_CARE_PROVIDER_SITE_OTHER): Payer: BLUE CROSS/BLUE SHIELD | Admitting: Physician Assistant

## 2018-08-02 ENCOUNTER — Other Ambulatory Visit: Payer: Self-pay

## 2018-08-02 VITALS — BP 124/84 | HR 80 | Temp 97.4°F | Resp 16 | Ht 68.0 in | Wt 173.4 lb

## 2018-08-02 DIAGNOSIS — R7303 Prediabetes: Secondary | ICD-10-CM

## 2018-08-02 DIAGNOSIS — H7201 Central perforation of tympanic membrane, right ear: Secondary | ICD-10-CM | POA: Diagnosis not present

## 2018-08-02 DIAGNOSIS — M3500 Sicca syndrome, unspecified: Secondary | ICD-10-CM | POA: Diagnosis not present

## 2018-08-02 DIAGNOSIS — E538 Deficiency of other specified B group vitamins: Secondary | ICD-10-CM

## 2018-08-02 DIAGNOSIS — E782 Mixed hyperlipidemia: Secondary | ICD-10-CM

## 2018-08-02 MED ORDER — AZITHROMYCIN 250 MG PO TABS: ORAL_TABLET | ORAL | 1 refills | Status: AC

## 2018-08-02 NOTE — Patient Instructions (Addendum)
COLD INFORMATION  Try just the allergy pill and prednisone for a few days, you always want to give your body 10 days to fight off infection with help before taking an anabiotic.   BEWARE ANTIBIOTICS Antibiotics have been linked with colon infections, resistance and newest theory is colon cancer in 40-50 year olds. So it is VERY important to try to avoid them, antibiotics are NOT risk free medications.   If you are not feeling better make an office visit OR CONTACT us.   Here is more info below HOW TO TREAT VIRAL COUGH AND COLD SYMPTOMS:  -Symptoms usually last at least 1 week with the worst symptoms being around day 4.  - colds usually start with a sore throat and end with a cough, and the cough can take 2 weeks to get better.  -No antibiotics are needed for colds, flu, sore throats, cough, bronchitis UNLESS symptoms are longer than 7 days OR if you are getting better then get drastically worse.  -There are a lot of combination medications (Dayquil, Nyquil, Vicks 44, tyelnol cold and sinus, ETC). Please look at the ingredients on the back so that you are treating the correct symptoms and not doubling up on medications/ingredients.    Medicines you can use  Nasal congestion  Little Remedies saline spray (aerosol/mist)- can try this, it is in the kids section - pseudoephedrine (Sudafed)- behind the counter, do not use if you have high blood pressure, medicine that have -D in them.  - phenylephrine (Sudafed PE) -Dextormethorphan + chlorpheniramine (Coridcidin HBP)- okay if you have high blood pressure -Oxymetazoline (Afrin) nasal spray- LIMIT to 3 days -Saline nasal spray -Neti pot (used distilled or bottled water)  Ear pain/congestion  -pseudoephedrine (sudafed) - Nasonex/flonase nasal spray  Fever  -Acetaminophen (Tyelnol) -Ibuprofen (Advil, motrin, aleve)  Sore Throat  -Acetaminophen (Tyelnol) -Ibuprofen (Advil, motrin, aleve) -Drink a lot of water -Gargle with salt water -  Rest your voice (don't talk) -Throat sprays -Cough drops  Body Aches  -Acetaminophen (Tyelnol) -Ibuprofen (Advil, motrin, aleve)  Headache  -Acetaminophen (Tyelnol) -Ibuprofen (Advil, motrin, aleve) - Exedrin, Exedrin Migraine  Allergy symptoms (cough, sneeze, runny nose, itchy eyes) -Claritin or loratadine cheapest but likely the weakest  -Zyrtec or certizine at night because it can make you sleepy -The strongest is allegra or fexafinadine  Cheapest at [employer], [employer], [employer]  Cough  -Dextromethorphan (Delsym)- medicine that has DM in it -Guafenesin (Mucinex/Robitussin) - cough drops - drink lots of water  Chest Congestion  -Guafenesin (Mucinex/Robitussin)  Red Itchy Eyes  - Naphcon-A  Upset Stomach  - Bland diet (nothing spicy, greasy, fried, and high acid foods like tomatoes, oranges, berries) -OKAY- cereal, bread, soup, crackers, rice -Eat smaller more frequent meals -reduce caffeine, no alcohol -Loperamide (Imodium-AD) if diarrhea -Prevacid for heart burn  General health when sick  -Hydration -wash your hands frequently -keep surfaces clean -change pillow cases and sheets often -Get fresh air but do not exercise strenuously -Vitamin D, double up on it - Vitamin C -Zinc   Eardrum Rupture, Adult  An eardrum rupture is a hole (perforation) in the eardrum. The eardrum is a thin, round tissue inside of the ear that separates the ear canal from the middle ear. The eardrum is also called the tympanic membrane. It transfers sound vibrations through small bones in the middle ear to the hearing nerve in the inner ear. It also protects the middle ear from germs. An eardrum rupture can cause pain and hearing loss. What are the  causes? This condition may be caused by:  An infection.  A sudden injury, such as from: ? Inserting a thin, sharp object into the ear. ? A hit to the side of the head, especially by an open hand. ? Falling onto water or a flat surface. ? A  rapid change in pressure, such as from flying or scuba diving. ? A sudden increase in pressure against the eardrum, such as from an explosion or a very loud noise.  Inserting a cotton-tipped swab in the ear.  A long-term eustachian tube disorder. Eustachian tubes are parts of the body that connect each middle ear space to the back of the nose.  A medical procedure or surgery, such as a procedure to remove wax from the ear canal.  Removing a man-made pressure equalization tube(PE tube) that was placed through the eardrum.  Having a PE tube fall out. What increases the risk? You are more likely to develop this condition if:  You have had PE tubes inserted in your ears.  You have an ear infection.  You play sports that: ? Involve balls or contact with other players. ? Take place in water, such as diving, scuba diving, or waterskiing. What are the signs or symptoms? Symptoms of this condition include:  Sudden pain at the time of the injury.  Ear pain that suddenly improves.  Ringing in the ear after the injury.  Drainage from the ear. The drainage may be clear, cloudy or pus-like, or bloody.  Hearing loss.  Dizziness. How is this diagnosed? This condition is diagnosed based on your symptoms and medical history as well as a physical exam. Your health care provider can usually see a perforation using an ear scope (otoscope). You may have tests, such as:  A hearing test (audiogram) to check for hearing loss.  A test in which a sample of ear drainage is tested for infection (culture). How is this treated? An eardrum typically heals on its own within a few weeks. If your eardrum does not heal, your health care provider may recommend a procedure to place a patch over your eardrum or surgery to repair your eardrum. Your health care provider may also prescribe antibiotic medicines to help prevent infection. If the ear heals completely, any hearing loss should be temporary. Follow  these instructions at home:  Keep your ear dry. This is very important. Follow instructions from your health care provider about how to keep your ear dry. You may need to wear waterproof earplugs when bathing and swimming.  Take over-the-counter and prescription medicines only as told by your health care provider.  Return to sports and activities as told by your health care provider. Ask your health care provider what activities are safe for you.  Wear headgear with ear protection when you play sports in which ear injuries are common.  If directed, apply heat to your affected ear as often as told by your health care provider. Use the heat source that your health care provider recommends, such as a moist heat pack or a heating pad. This will help to relieve pain. ? Place a towel between your skin and the heat source. ? Leave the heat on for 20-30 minutes. ? Remove the heat if your skin turns bright red. This is especially important if you are unable to feel pain, heat, or cold. You may have a greater risk of getting burned.  Keep all follow-up visits as told by your health care provider. This is important.  Talk to  your health care provider before traveling by plane. Contact a health care provider if:  You have mucus or blood draining from your ear.  You have a fever.  You have ear pain.  You have hearing loss, dizziness, or ringing in your ear. Get help right away if:  You have sudden hearing loss.  You are very dizzy.  You have severe ear pain.  Your face feels weak or becomes paralyzed. Summary  An eardrum rupture is a hole (perforation) in the eardrum that can cause pain and hearing loss. It is usually caused by a sudden injury to the ear.  The eardrum will likely heal on its own within a few weeks. In some cases, surgery may be necessary.  After the injury, follow instructions from your health care provider about how to keep your ear dry as it heals. This information is  not intended to replace advice given to you by your health care provider. Make sure you discuss any questions you have with your health care provider. Document Released: 04/25/2000 Document Revised: 07/04/2016 Document Reviewed: 07/04/2016 Elsevier Interactive Patient Education  2019 [employer]

## 2018-08-02 NOTE — Progress Notes (Signed)
Subjective:    Patient ID: Bianca Fernandez, female    DOB: January 23, 1956, 63 y.o.   MRN: [id number]  HPI 63 y.o. WF presents for follow up with right ear TM perforation.  Started with allergies x Thursday night. No fever, chills. She is on antihistamine.  She has no travel history and no possible exposure to COVID 19 patient. She did start on B12 liquid, will recheck today, was 300 and 286 last.   Blood pressure 124/84, pulse 80, temperature (!) 97.4 F (36.3 C), resp. rate 16, height 5\' 8"  (1.727 m), weight 173 lb 6.4 oz (78.7 kg).  Medications Current Outpatient Medications on File Prior to Visit  Medication Sig  . B Complex-C (SUPER B COMPLEX PO) Take by mouth 2 (two) times daily.   . Calcium Carbonate-Vitamin D (CALTRATE 600+D PO) Take by mouth 2 (two) times daily. Patient states 630 mg calcium and 500 IU Vitamin D  . Cholecalciferol (VITAMIN D) 2000 UNITS tablet Take 2,000 Units by mouth daily.  . Magnesium 400 MG TABS Take by mouth daily.  . Omega 3-6-9 Fatty Acids (OMEGA 3-6-9 COMPLEX PO) Take 1 tablet by mouth daily.   No current facility-administered medications on file prior to visit.     Problem list She has Sjogren's disease (HCC); VSD (ventricular septal defect), perimembranous; Osteopenia; Vitamin D deficiency; Migraines; Rectocele, female; Cystocele with prolapse; Prediabetes; Hyperlipidemia; Elevated blood pressure reading without diagnosis of hypertension; and Anxiety on their problem list.    Review of Systems  Constitutional: Negative.   HENT: Positive for postnasal drip, rhinorrhea and sneezing. Negative for congestion, dental problem, drooling, ear discharge, ear pain, facial swelling, hearing loss, mouth sores, nosebleeds, sinus pressure, sinus pain, sore throat, tinnitus, trouble swallowing and voice change.   Respiratory: Positive for cough. Negative for apnea, choking, chest tightness, shortness of breath, wheezing and stridor.   Cardiovascular: Negative.    Gastrointestinal: Negative.   Genitourinary: Negative.   Musculoskeletal: Negative.        Objective:   Physical Exam General Appearance: Well nourished well developed, in no apparent distress.  Eyes: PERRLA, EOMs, conjunctiva no swelling or erythema ENT/Mouth: Ear canals normal without obstruction, swelling, erythema, or discharge.  TMs normal left ear with no erythema, bulging, retraction, or loss of landmarks but right ear with hole in TM at 10 oclock that looks crusted over with pus, TM is white/cloudy and slightly bulging, some mastoid tenderness.  Oropharynx moist and clear with no exudate, erythema, or swelling.  Neck: Supple, thyroid normal. No bruits.  No cervical adenopathy Respiratory: Respiratory effort normal, Breath sounds clear A&P without wheeze, rhonchi, rales.   Cardio: RRR with holosystolic low pitched murmur heard best on the R 3rd ICS, no rubs or gallops. Brisk peripheral pulses without edema.  Chest: symmetric, with normal excursions  Abdomen: Soft, nontender, no guarding, rebound, hernias, masses, or organomegaly.  Lymphatics: Non tender without lymphadenopathy. Musculoskeletal: Full ROM all peripheral extremities,5/5 strength, and normal gait.  Skin: Warm, dry without rashes, lesions, ecchymosis. Neuro: Awake and oriented X 3, Cranial nerves intact, reflexes equal bilaterally. Normal muscle tone, no cerebellar symptoms. Sensation intact.  Psych:  normal affect, Insight and Judgment appropriate.      Assessment & Plan:   Sjogren's syndrome, with unspecified organ involvement (HCC) Continue meds  Prediabetes -     CBC with Differential/Platelet -     COMPLETE METABOLIC PANEL WITH GFR Discussed disease progression and risks Discussed diet/exercise, weight management and risk modification   Mixed hyperlipidemia -  CBC with Differential/Platelet -     COMPLETE METABOLIC PANEL WITH GFR check lipids decrease fatty foods increase activity.   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 Follow up Dr. Ezzard Standing  B12 deficiency -     Vitamin B12    Follow up Dr. Ezzard Standing, patient instructed  Future Appointments  Date Time Provider Department Center  06/08/2019 10:00 AM Quentin Mulling, PA-C [id number] None

## 2018-08-03 LAB — COMPLETE METABOLIC PANEL WITH GFR
AG Ratio: 1.9 (calc) (ref 1.0–2.5)
ALKALINE PHOSPHATASE (APISO): 76 U/L (ref 37–153)
ALT: 17 U/L (ref 6–29)
AST: 20 U/L (ref 10–35)
Albumin: 4.5 g/dL (ref 3.6–5.1)
BILIRUBIN TOTAL: 0.4 mg/dL (ref 0.2–1.2)
BUN: 8 mg/dL (ref 7–25)
CHLORIDE: 97 mmol/L — AB (ref 98–110)
CO2: 26 mmol/L (ref 20–32)
Calcium: 9.5 mg/dL (ref 8.6–10.4)
Creat: 0.68 mg/dL (ref 0.50–0.99)
GFR, Est African American: 107 mL/min/{1.73_m2} (ref >=60)
GFR, Est Non African American: 92 mL/min/{1.73_m2} (ref >=60)
Globulin: 2.4 g/dL (calc) (ref 1.9–3.7)
Glucose, Bld: 81 mg/dL (ref 65–99)
Potassium: 4.6 mmol/L (ref 3.5–5.3)
Sodium: 133 mmol/L — ABNORMAL LOW (ref 135–146)
Total Protein: 6.9 g/dL (ref 6.1–8.1)

## 2018-08-03 LAB — CBC WITH DIFFERENTIAL/PLATELET
Absolute Monocytes: 756 cells/uL (ref 200–950)
Basophils Absolute: 60 cells/uL (ref 0–200)
Basophils Relative: 1 %
Eosinophils Absolute: 60 cells/uL (ref 15–500)
Eosinophils Relative: 1 %
HCT: 36.4 % (ref 35.0–45.0)
Hemoglobin: 12.3 g/dL (ref 11.7–15.5)
Lymphs Abs: 1560 cells/uL (ref 850–3900)
MCH: 29.9 pg (ref 27.0–33.0)
MCHC: 33.8 g/dL (ref 32.0–36.0)
MCV: 88.3 fL (ref 80.0–100.0)
MPV: 9.6 fL (ref 7.5–12.5)
Monocytes Relative: 12.6 %
Neutro Abs: 3564 cells/uL (ref 1500–7800)
Neutrophils Relative %: 59.4 %
Platelets: 285 10*3/uL (ref 140–400)
RBC: 4.12 10*6/uL (ref 3.80–5.10)
RDW: 12.9 % (ref 11.0–15.0)
TOTAL LYMPHOCYTE: 26 %
WBC: 6 10*3/uL (ref 3.8–10.8)

## 2018-08-03 LAB — VITAMIN B12: Vitamin B-12: 434 pg/mL (ref 200–1100)

## 2018-08-05 DIAGNOSIS — H6993 Unspecified Eustachian tube disorder, bilateral: Secondary | ICD-10-CM | POA: Diagnosis not present

## 2018-11-01 DIAGNOSIS — M1990 Unspecified osteoarthritis, unspecified site: Secondary | ICD-10-CM | POA: Diagnosis not present

## 2018-11-01 DIAGNOSIS — M3500 Sicca syndrome, unspecified: Secondary | ICD-10-CM | POA: Diagnosis not present

## 2018-11-01 DIAGNOSIS — M542 Cervicalgia: Secondary | ICD-10-CM | POA: Diagnosis not present

## 2018-11-11 ENCOUNTER — Ambulatory Visit (INDEPENDENT_AMBULATORY_CARE_PROVIDER_SITE_OTHER): Payer: BC Managed Care – PPO | Admitting: Adult Health

## 2018-11-11 ENCOUNTER — Other Ambulatory Visit: Payer: Self-pay | Admitting: Adult Health

## 2018-11-11 ENCOUNTER — Ambulatory Visit
Admission: RE | Admit: 2018-11-11 | Discharge: 2018-11-11 | Disposition: A | Discharge status: Home / Self Care | Payer: BLUE CROSS/BLUE SHIELD | Source: Ambulatory Visit | Attending: Adult Health | Admitting: Adult Health

## 2018-11-11 ENCOUNTER — Other Ambulatory Visit: Payer: Self-pay

## 2018-11-11 ENCOUNTER — Encounter: Payer: Self-pay | Admitting: Adult Health

## 2018-11-11 VITALS — BP 142/82 | HR 91 | Temp 96.6°F

## 2018-11-11 DIAGNOSIS — M25572 Pain in left ankle and joints of left foot: Secondary | ICD-10-CM

## 2018-11-11 DIAGNOSIS — S8252XA Displaced fracture of medial malleolus of left tibia, initial encounter for closed fracture: Secondary | ICD-10-CM | POA: Diagnosis not present

## 2018-11-11 DIAGNOSIS — S82892A Other fracture of left lower leg, initial encounter for closed fracture: Secondary | ICD-10-CM

## 2018-11-11 MED ORDER — OXYCODONE-ACETAMINOPHEN 5-325 MG PO TABS: 1.0000 | ORAL_TABLET | Freq: Four times a day (QID) | ORAL | 0 refills | Status: DC | PRN

## 2018-11-11 NOTE — Patient Instructions (Signed)
Can alternate between ibuprofen ~800 mg (every 6-8 hours) and 1000 mg tylenol for pain; apply ice 20 min at a time with a barrier  Keep extremity elevated and avoid weight bearing  Reserve percocet for severe pain; take sparingly; do not take with alcohol or sedating medications, don't take and drive   Acetaminophen; Oxycodone tablets What is this medicine? ACETAMINOPHEN; OXYCODONE (a set a MEE noe fen; ox i KOE done) is a pain reliever. It is used to treat moderate to severe pain. This medicine may be used for other purposes; ask your health care provider or pharmacist if you have questions. COMMON BRAND NAME(S): Endocet, Magnacet, Nalocet, Narvox, Percocet, Perloxx, Primalev, Primlev, Roxicet, Xolox What should I tell my health care provider before I take this medicine? They need to know if you have any of these conditions:  brain tumor  Crohn's disease, inflammatory bowel disease, or ulcerative colitis  drug abuse or addiction  head injury  heart or circulation problems  if you often drink alcohol  kidney disease or problems going to the bathroom  liver disease  lung disease, asthma, or breathing problems  an unusual or allergic reaction to acetaminophen, oxycodone, other opioid analgesics, other medicines, foods, dyes, or preservatives  pregnant or trying to get pregnant  breast-feeding How should I use this medicine? Take this medicine by mouth with a full glass of water. Follow the directions on the prescription label. You can take it with or without food. If it upsets your stomach, take it with food. Take your medicine at regular intervals. Do not take it more often than directed. A special MedGuide will be given to you by the pharmacist with each prescription and refill. Be sure to read this information carefully each time. Talk to your pediatrician regarding the use of this medicine in children. Special care may be needed. Overdosage: If you think you have taken too  much of this medicine contact a poison control center or emergency room at once. NOTE: This medicine is only for you. Do not share this medicine with others. What if I miss a dose? If you miss a dose, take it as soon as you can. If it is almost time for your next dose, take only that dose. Do not take double or extra doses. What may interact with this medicine? This medicine may interact with the following medications:  alcohol  antihistamines for allergy, cough and cold  antiviral medicines for HIV or AIDS  atropine  certain antibiotics like clarithromycin, erythromycin, linezolid, rifampin  certain medicines for anxiety or sleep  certain medicines for bladder problems like oxybutynin, tolterodine  certain medicines for depression like amitriptyline, fluoxetine, sertraline  certain medicines for fungal infections like ketoconazole, itraconazole, voriconazole  certain medicines for migraine headache like almotriptan, eletriptan, frovatriptan, naratriptan, rizatriptan, sumatriptan, zolmitriptan  certain medicines for nausea or vomiting like dolasetron, ondansetron, palonosetron  certain medicines for Parkinson's disease like benztropine, trihexyphenidyl  certain medicines for seizures like phenobarbital, phenytoin, primidone  certain medicines for stomach problems like dicyclomine, hyoscyamine  certain medicines for travel sickness like scopolamine  diuretics  general anesthetics like halothane, isoflurane, methoxyflurane, propofol  ipratropium  local anesthetics like lidocaine, pramoxine, tetracaine  MAOIs like Carbex, Eldepryl, Marplan, Nardil, and Parnate  medicines that relax muscles for surgery  methylene blue  nilotinib  other medicines with acetaminophen  other narcotic medicines for pain or cough  phenothiazines like chlorpromazine, mesoridazine, prochlorperazine, thioridazine This list may not describe all possible interactions. Give your health care  provider a list of all the medicines, herbs, non-prescription drugs, or dietary supplements you use. Also tell them if you smoke, drink alcohol, or use illegal drugs. Some items may interact with your medicine. What should I watch for while using this medicine? Tell your doctor or health care professional if your pain does not go away, if it gets worse, or if you have new or a different type of pain. You may develop tolerance to the medicine. Tolerance means that you will need a higher dose of the medication for pain relief. Tolerance is normal and is expected if you take this medicine for a long time. Do not suddenly stop taking your medicine because you may develop a severe reaction. Your body becomes used to the medicine. This does NOT mean you are addicted. Addiction is a behavior related to getting and using a drug for a non-medical reason. If you have pain, you have a medical reason to take pain medicine. Your doctor will tell you how much medicine to take. If your doctor wants you to stop the medicine, the dose will be slowly lowered over time to avoid any side effects. There are different types of narcotic medicines (opiates). If you take more than one type at the same time or if you are taking another medicine that also causes drowsiness, you may have more side effects. Give your health care provider a list of all medicines you use. Your doctor will tell you how much medicine to take. Do not take more medicine than directed. Call emergency for help if you have problems breathing or unusual sleepiness. Do not take other medicines that contain acetaminophen with this medicine. Always read labels carefully. If you have questions, ask your doctor or pharmacist. If you take too much acetaminophen get medical help right away. Too much acetaminophen can be very dangerous and cause liver damage. Even if you do not have symptoms, it is important to get help right away. You may get drowsy or dizzy. Do not  drive, use machinery, or do anything that needs mental alertness until you know how this medicine affects you. Do not stand or sit up quickly, especially if you are an older patient. This reduces the risk of dizzy or fainting spells. Alcohol may interfere with the effect of this medicine. Avoid alcoholic drinks. The medicine will cause constipation. Try to have a bowel movement at least every 2 to 3 days. If you do not have a bowel movement for 3 days, call your doctor or health care professional. Your mouth may get dry. Chewing sugarless gum or sucking hard candy, and drinking plenty or water may help. Contact your doctor if the problem does not go away or is severe. What side effects may I notice from receiving this medicine? Side effects that you should report to your doctor or health care professional as soon as possible:  allergic reactions like skin rash, itching or hives, swelling of the face, lips, or tongue  breathing problems  confusion  redness, blistering, peeling or loosening of the skin, including inside the mouth  signs and symptoms of liver injury like dark yellow or brown urine; general ill feeling or flu-like symptoms; light-colored stools; loss of appetite; nausea; right upper belly pain; unusually weak or tired; yellowing of the eyes or skin  signs and symptoms of low blood pressure like dizziness; feeling faint or lightheaded, falls; unusually weak or tired  trouble passing urine or change in the amount of urine Side effects that usually do  not require medical attention (report to your doctor or health care professional if they continue or are bothersome):  constipation  dry mouth  nausea, vomiting  tiredness This list may not describe all possible side effects. Call your doctor for medical advice about side effects. You may report side effects to FDA at 1-800-FDA-1088. Where should I keep my medicine? Keep out of the reach of children. This medicine can be abused.  Keep your medicine in a safe place to protect it from theft. Do not share this medicine with anyone. Selling or giving away this medicine is dangerous and against the law. Store at room temperature between 20 and 25 degrees C (68 and 77 degrees F). This medicine may cause harm and death if it is taken by other adults, children, or pets. Return medicine that has not been used to an official disposal site. Contact the DEA at [phone])[phone] or your city/county government to find a site. If you cannot return the medicine, flush it down the toilet. Do not use the medicine after the expiration date. NOTE: This sheet is a summary. It may not cover all possible information. If you have questions about this medicine, talk to your doctor, pharmacist, or health care provider.  2020 Elsevier/Gold Standard (2016-09-02 15:46:38) Ankle Pain The ankle joint holds your body weight and allows you to move around. Ankle pain can occur on either side or the back of one ankle or both ankles. Ankle pain may be sharp and burning or dull and aching. There may be tenderness, stiffness, redness, or warmth around the ankle. Many things can cause ankle pain, including an injury to the area and overuse of the ankle. Follow these instructions at home: Activity  Rest your ankle as told by your health care provider. Avoid any activities that cause ankle pain.  Do not use the injured limb to support your body weight until your health care provider says that you can. Use crutches as told by your health care provider.  Do exercises as told by your health care provider.  Ask your health care provider when it is safe to drive if you have a brace on your ankle. If you have a brace:  Wear the brace as told by your health care provider. Remove it only as told by your health care provider.  Loosen the brace if your toes tingle, become numb, or turn cold and blue.  Keep the brace clean.  If the brace is not waterproof: ? Do not let  it get wet. ? Cover it with a watertight covering when you take a bath or shower. If you were given an elastic bandage:   Remove it when you take a bath or a shower.  Try not to move your ankle very much, but wiggle your toes from time to time. This helps to prevent swelling.  Adjust the bandage to make it more comfortable if it feels too tight.  Loosen the bandage if you have numbness or tingling in your foot or if your foot turns cold and blue. Managing pain, stiffness, and swelling   If directed, put ice on the painful area. ? If you have a removable brace or elastic bandage, remove it as told by your health care provider. ? Put ice in a plastic bag. ? Place a towel between your skin and the bag. ? Leave the ice on for 20 minutes, 2-3 times a day.  Move your toes often to avoid stiffness and to lessen swelling.  Raise (elevate)  your ankle above the level of your heart while you are sitting or lying down. General instructions  Record information about your pain. Writing down the following may be helpful for you and your health care provider: ? How often you have ankle pain. ? Where the pain is located. ? What the pain feels like.  If treatment involves wearing a prescribed shoe or insole, make sure you wear it correctly and for as long as told by your health care provider.  Take over-the-counter and prescription medicines only as told by your health care provider.  Keep all follow-up visits as told by your health care provider. This is important. Contact a health care provider if:  Your pain gets worse.  Your pain is not relieved with medicines.  You have a fever or chills.  You are having more trouble with walking.  You have new symptoms. Get help right away if:  Your foot, leg, toes, or ankle: ? Tingles or becomes numb. ? Becomes swollen. ? Turns pale or blue. Summary  Ankle pain can occur on either side or the back of one ankle or both ankles.  Ankle pain  may be sharp and burning or dull and aching.  Rest your ankle as told by your health care provider. If told, apply ice to the area.  Take over-the-counter and prescription medicines only as told by your health care provider. This information is not intended to replace advice given to you by your health care provider. Make sure you discuss any questions you have with your health care provider. Document Released: 10/16/2009 Document Revised: 08/17/2018 Document Reviewed: 11/04/2017 Elsevier Patient Education  2020 [employer]

## 2018-11-11 NOTE — Progress Notes (Signed)
XRay of left ankle obtained after fall this AM demonstrates fractures with displacement and dislocation; emphasized non-weight bearing, RICE, will place urgent ortho referral and follow up per their recommendations based on xray to be evaluated at the soonest availability. She has been prescribed boot and crutches.   IMPRESSION: Moderately displaced distal left fibular and medial malleolar fractures. Mildly displaced posterior malleolar fracture. Mild lateral dislocation of talus relative to distal tibia is noted.

## 2018-11-11 NOTE — Progress Notes (Signed)
Assessment and Plan:  Bianca Fernandez was seen today for ankle injury.  Diagnoses and all orders for this visit:  Acute left ankle pain Nondisplaced fracture vs severe sprain; exam limited by edema and pain; xray to investigate further RICE reviewed; ACE applied; written script and directed to medical supply provider for crutches and boot which she will need regardless, advised to avoid weight bearing completed at this time Ibuprofen/tylenol alternated, percocet for severe pain; PDMP reveiwed and SE and risks reviewed, advised not to drive while taking or combine with ETOH or other sedating agents Consider recheck in office vs refer to ortho pending XR -     DG Ankle Complete Left; Future -     oxyCODONE-acetaminophen (PERCOCET/ROXICET) 5-325 MG tablet; Take 1 tablet by mouth every 6 (six) hours as needed for severe pain.  Further disposition pending results of labs. Discussed med's effects and SE's.   Over 15 minutes of exam, counseling, chart review, and critical decision making was performed.   Future Appointments  Date Time Provider Department Center  06/08/2019 10:00 AM Quentin Mullingollier, Amanda, PA-C [facility] None    ------------------------------------------------------------------------------------------------------------------   HPI BP (!) 142/82   Pulse 91   Temp (!) 96.6 F (35.9 C)   SpO2 98%   63 y.o.female presents accompanied by her husband for evaluation of left ankle; she was walking this AM with her daughter and stepped in a pot hole and twisted ankle. There was a "popping," sound, felt nauseous, currently 10/10 pain, sharp/achy and throbbing, has bilateral ankle pain, is unable to bear weight due to pain. She has taken 800 mg ibuprofen immediately after this happened around 2 hours ago, did help somewhat but still in significant amount of pain at this time, in wheelchair, unable to bear weight.   Past Medical History:  Diagnosis Date  . Anemia   . GERD (gastroesophageal reflux  disease)   . Migraines   . Osteopenia   . Sjogren's disease (HCC)   . Unspecified vitamin D deficiency   . VSD (ventricular septal defect), perimembranous      No Known Allergies  Current Outpatient Medications on File Prior to Visit  Medication Sig  . B Complex-C (SUPER B COMPLEX PO) Take by mouth 2 (two) times daily.   . Calcium Carbonate-Vitamin D (CALTRATE 600+D PO) Take by mouth 2 (two) times daily. Patient states 630 mg calcium and 500 IU Vitamin D  . Cholecalciferol (VITAMIN D) 2000 UNITS tablet Take 2,000 Units by mouth daily.  . Magnesium 400 MG TABS Take by mouth daily.  . Omega 3-6-9 Fatty Acids (OMEGA 3-6-9 COMPLEX PO) Take 1 tablet by mouth daily.   No current facility-administered medications on file prior to visit.     ROS: all negative except above.   Physical Exam:  BP (!) 142/82   Pulse 91   Temp (!) 96.6 F (35.9 C)   SpO2 98%   General Appearance: Well nourished, in no acute distress though appears in pain Eyes: conjunctiva no swelling or erythema ENT/Mouth: Hearing normal.  Neck: Supple Respiratory: Respiratory effort normal, BS equal bilaterally without rales, rhonchi, wheezing or stridor.  Cardio: RRR with no MRGs. Brisk peripheral pulses - left ankle profoundly swollen, non-pitting Abdomen: Soft, + BS.  Non tender, no guarding, rebound, hernias, masses. Lymphatics: Non tender without lymphadenopathy.  Musculoskeletal: In wheelchair, unable to bear weight or ambulate; left ankle with significant edema; alignment is intact; palpation and ROM significantly limited by pain though no obvious or palapable displaced bony abnormality, no  severe laxity. Pain worse with palpation of lateral malleolus and with eversion of ankle Skin: Warm, dry; mild abrasion of left medical ankle/lower leg, no ecchymosis Neuro: Sensation intact.  Psych: Awake and oriented X 3, tearful affect, Insight and Judgment appropriate.     Izora Ribas, NP 10:51 AM [facility]  [facility]

## 2018-11-15 ENCOUNTER — Ambulatory Visit (INDEPENDENT_AMBULATORY_CARE_PROVIDER_SITE_OTHER): Payer: BC Managed Care – PPO | Admitting: Orthopedic Surgery

## 2018-11-15 ENCOUNTER — Encounter (HOSPITAL_COMMUNITY): Payer: Self-pay | Admitting: *Deleted

## 2018-11-15 ENCOUNTER — Other Ambulatory Visit: Payer: Self-pay

## 2018-11-15 ENCOUNTER — Encounter: Payer: Self-pay | Admitting: Orthopedic Surgery

## 2018-11-15 ENCOUNTER — Other Ambulatory Visit (HOSPITAL_COMMUNITY)
Admission: RE | Admit: 2018-11-15 | Discharge: 2018-11-15 | Disposition: A | Discharge status: Home / Self Care | Payer: BC Managed Care – PPO | Source: Ambulatory Visit | Attending: Orthopedic Surgery | Admitting: Orthopedic Surgery

## 2018-11-15 VITALS — Ht 68.0 in | Wt 173.4 lb

## 2018-11-15 DIAGNOSIS — S82852A Displaced trimalleolar fracture of left lower leg, initial encounter for closed fracture: Secondary | ICD-10-CM

## 2018-11-15 DIAGNOSIS — S82852B Displaced trimalleolar fracture of left lower leg, initial encounter for open fracture type I or II: Secondary | ICD-10-CM | POA: Diagnosis not present

## 2018-11-15 DIAGNOSIS — Z1159 Encounter for screening for other viral diseases: Secondary | ICD-10-CM | POA: Insufficient documentation

## 2018-11-15 DIAGNOSIS — Z01812 Encounter for preprocedural laboratory examination: Secondary | ICD-10-CM | POA: Insufficient documentation

## 2018-11-15 DIAGNOSIS — S82892A Other fracture of left lower leg, initial encounter for closed fracture: Secondary | ICD-10-CM | POA: Diagnosis not present

## 2018-11-15 HISTORY — DX: Displaced trimalleolar fracture of left lower leg, initial encounter for closed fracture: S82.852A

## 2018-11-15 NOTE — Progress Notes (Signed)
Office Visit Note   Patient: Bianca Fernandez           Date of Birth: [birth year]           MRN: [id number] Visit Date: 11/15/2018              Requested by: Unk Pinto, [address] [place] [address] [place],  [place] [address] PCP: Unk Pinto, MD  Chief Complaint  Patient presents with  . Left Ankle - Pain    NP; left ankle fx      HPI: Patient is a 63 year old woman who presents for initial evaluation for trimalleolar left ankle fracture.  Patient states she was looking both ways to cross the street when she twisted her ankle on a pothole.  Patient states her pain is a 8-10 complains of swelling.  Assessment & Plan: Visit Diagnoses:  1. Triplanar ankle fracture, closed, left, initial encounter     Plan: We will plan for open reduction internal fixation of the left ankle on Wednesday at [facility].  Risk and benefits were discussed including infection neurovascular injury pain arthritis need for additional surgery.  Patient states she understands wished to proceed at this time recommended getting a walker kneeling scooter we will place her in a fracture boot.  Follow-Up Instructions: Return in about 2 weeks (around 11/29/2018).   Ortho Exam  Patient is alert, oriented, no adenopathy, well-dressed, normal affect, normal respiratory effort. Examination patient has bruising and swelling of the ankle and foot on the left.  She has a good dorsalis pedis pulse her foot is neurovascular intact.  Radiographs shows a trimalleolar ankle fracture Weber B fibular fracture with displacement.  Imaging: No results found. No images are attached to the encounter.  Labs: Lab Results  Component Value Date   HGBA1C 5.6 06/03/2018   HGBA1C 5.5 05/26/2016   HGBA1C 5.9 (H) 05/24/2015   LABORGA Multiple bacterial morphotypes present, none 05/17/2014   LABORGA predominant. Suggest appropriate recollection if  05/17/2014   LABORGA clinically indicated. 05/17/2014     Lab Results   Component Value Date   ALBUMIN 4.6 05/26/2016   ALBUMIN 4.6 05/24/2015   ALBUMIN 4.5 09/12/2014    Lab Results  Component Value Date   MG 2.0 06/03/2018   MG 1.9 05/26/2017   MG 1.9 05/24/2015   Lab Results  Component Value Date   VD25OH 38 06/03/2018   VD25OH 35 05/26/2017   VD25OH 40 05/24/2015    No results found for: PREALBUMIN CBC EXTENDED Latest Ref Rng & Units 08/02/2018 06/03/2018 05/26/2017  WBC 3.8 - 10.8 Thousand/uL 6.0 8.0 7.7  RBC 3.80 - 5.10 Million/uL 4.12 4.45 4.36  HGB 11.7 - 15.5 g/dL 12.3 13.2 12.8  HCT 35.0 - 45.0 % 36.4 39.2 37.7  PLT 140 - 400 Thousand/uL 285 342 344  NEUTROABS 1,500 - 7,800 cells/uL 3,564 5,576 5,313  LYMPHSABS 850 - 3,900 cells/uL 1,560 1,696 1,656     Body mass index is 26.37 kg/m.  Orders:  No orders of the defined types were placed in this encounter.  No orders of the defined types were placed in this encounter.    Procedures: No procedures performed  Clinical Data: No additional findings.  ROS:  All other systems negative, except as noted in the HPI. Review of Systems  Objective: Vital Signs: Ht 5\' 8"  (1.727 m)   Wt 173 lb 6.4 oz (78.7 kg)   BMI 26.37 kg/m   Specialty Comments:  No specialty comments available.  [place]  History: Patient Active Problem List   Diagnosis Date Noted  . Anxiety 04/27/2017  . Rectocele, female 09/12/2014  . Cystocele with prolapse 09/12/2014  . Prediabetes 09/12/2014  . Hyperlipidemia 09/12/2014  . Elevated blood pressure reading without diagnosis of hypertension 09/12/2014  . Sjogren's disease (HCC)   . VSD (ventricular septal defect), perimembranous   . Osteopenia   . Vitamin D deficiency   . Migraines    Past Medical History:  Diagnosis Date  . Anemia   . GERD (gastroesophageal reflux disease)   . Migraines   . Osteopenia   . Sjogren's disease (HCC)   . Unspecified vitamin D deficiency   . VSD (ventricular septal defect), perimembranous     Family History   Problem Relation Age of Onset  . Hypertension Mother   . Stroke Mother   . Hypertension Father   . Stroke Father   . Bladder Cancer Father   . Ovarian cancer Maternal Aunt   . Uterine cancer Maternal Grandmother   . ALS Other   . Multiple sclerosis Cousin   . Parkinson's disease Cousin   . Breast cancer Cousin     Past Surgical History:  Procedure Laterality Date  . ABLATION ON ENDOMETRIOSIS    . cervical conization  1976  . DILATION AND CURETTAGE OF UTERUS     x 2  . ESOPHAGOGASTRODUODENOSCOPY  01/28/13   Dr Bosie ClosSchooler- dysmotility  . lip biopsy     + sjogrens   Social History   Occupational History  . Not on file  Tobacco Use  . Smoking status: Never Smoker  . Smokeless tobacco: Never Used  Substance and Sexual Activity  . Alcohol use: Yes    Comment: rare  . Drug use: No  . Sexual activity: Not on file

## 2018-11-16 ENCOUNTER — Other Ambulatory Visit: Payer: Self-pay

## 2018-11-16 ENCOUNTER — Encounter (HOSPITAL_COMMUNITY): Payer: Self-pay | Admitting: *Deleted

## 2018-11-16 LAB — SARS CORONAVIRUS 2 (TAT 6-24 HRS): SARS Coronavirus 2: NEGATIVE

## 2018-11-16 NOTE — Anesthesia Preprocedure Evaluation (Addendum)
Anesthesia Evaluation  Patient identified by MRN, date of birth, ID band Patient awake    Reviewed: Allergy & Precautions, NPO status , Patient's Chart, lab work & pertinent test results  History of Anesthesia Complications (+) PONV, Family history of anesthesia reaction and history of anesthetic complications  Airway Mallampati: II  TM Distance: >3 FB Neck ROM: Full    Dental  (+) Teeth Intact, Dental Advisory Given   Pulmonary neg pulmonary ROS,    Pulmonary exam normal breath sounds clear to auscultation       Cardiovascular + Valvular Problems/Murmurs (VSD)  Rhythm:Regular Rate:Normal + Systolic murmurs    Neuro/Psych  Headaches, PSYCHIATRIC DISORDERS Anxiety Depression    GI/Hepatic Neg liver ROS, GERD  Medicated and Controlled,  Endo/Other  negative endocrine ROS  Renal/GU negative Renal ROS     Musculoskeletal  (+) Arthritis , Trimalleolar Left Ankle Fracture Sjogren's disease    Abdominal   Peds  Hematology negative hematology ROS (+)   Anesthesia Other Findings Day of surgery medications reviewed with the patient.  Reproductive/Obstetrics                            Anesthesia Physical Anesthesia Plan  ASA: II  Anesthesia Plan: General   Post-op Pain Management:  Regional for Post-op pain   Induction: Intravenous  PONV Risk Score and Plan: 4 or greater and Diphenhydramine, Scopolamine patch - Pre-op, Midazolam, Dexamethasone and Ondansetron  Airway Management Planned: LMA  Additional Equipment:   Intra-op Plan:   Post-operative Plan: Extubation in OR  Informed Consent: I have reviewed the patients History and Physical, chart, labs and discussed the procedure including the risks, benefits and alternatives for the proposed anesthesia with the patient or authorized representative who has indicated his/her understanding and acceptance.     Dental advisory given  Plan  Discussed with: CRNA  Anesthesia Plan Comments: (Known small perimembranous VSD. Last evaluated by Dr. Sallyanne Kuster in 2016, echo stable at that time, no changes.  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 [place] 2019 showed 60-79% stenosis of the right ICA.  TTE 2016: - Left ventricle: The cavity size was normal. Wall thickness was   normal. Systolic function was normal. The estimated ejection   fraction was in the range of 55% to 60%. Wall motion was normal;   there were no regional wall motion abnormalities. Doppler   parameters are consistent with abnormal left ventricular   relaxation (grade 1 diastolic dysfunction). The E/e&' ratio is <8,   suggesting normal LV filling pressure. - Ventricular septum: Small perimembranous VSD. - Mitral valve: Mildly thickened leaflets . There was mild   regurgitation. - Left atrium: The atrium was normal in size. - Right ventricle: The cavity size was normal. Wall thickness was   normal. Systolic function was normal. - Right atrium: The atrium was normal in size. - Tricuspid valve: There was trivial regurgitation. - Pulmonary arteries: PA peak pressure: 25 mm Hg (S). - Inferior vena cava: The vessel was normal in size. The   respirophasic diameter changes were in the normal range (= 50%),   consistent with normal central venous pressure.  Impressions:  - LVEF 55-60%, normal wall thickness, normal chamber sizes,   diastolic dysfunction, normal LV filling pressure, small   perimembranous VSD, mild MR, trivial TR, RVSP 25 mmHg, normal   IVC.)      Anesthesia Quick Evaluation

## 2018-11-16 NOTE — Progress Notes (Addendum)
I spoke with Mrs Bianca Fernandez denies chest pain or shortness.  Patient has been in quarantine since covid test. I instructed patient to not eat after midnight. I instructed her that she may have clear liquids until 0920. I reviewed what clear liquids consist of, patient voice understanding. I aske dif someone can come by the hospital a pick up Ensure Pre- Surgery beverage, she said that her husband will, I instructed patient to drink it between [place] and 0915. Mrs Bianca Fernandez has a history of heart murmer and  VSD. Patient saw Dr Sallyanne Kuster in 2016 and has not seen him since. Patient denies light headedness, occassionally has palpations without any pain, shortness of breath, lightheadedness; states it only last a short time. 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

## 2018-11-17 ENCOUNTER — Ambulatory Visit (HOSPITAL_COMMUNITY)
Admission: RE | Admit: 2018-11-17 | Discharge: 2018-11-17 | Disposition: A | Discharge status: Home / Self Care | Payer: BC Managed Care – PPO | Attending: Orthopedic Surgery | Admitting: Orthopedic Surgery

## 2018-11-17 ENCOUNTER — Ambulatory Visit (HOSPITAL_COMMUNITY): Payer: BC Managed Care – PPO | Admitting: Physician Assistant

## 2018-11-17 ENCOUNTER — Encounter (HOSPITAL_COMMUNITY)
Admission: RE | Disposition: A | Discharge status: Home / Self Care | Payer: Self-pay | Source: Home / Self Care | Attending: Orthopedic Surgery

## 2018-11-17 ENCOUNTER — Other Ambulatory Visit: Payer: Self-pay

## 2018-11-17 ENCOUNTER — Encounter (HOSPITAL_COMMUNITY): Payer: Self-pay

## 2018-11-17 DIAGNOSIS — E785 Hyperlipidemia, unspecified: Secondary | ICD-10-CM | POA: Diagnosis not present

## 2018-11-17 DIAGNOSIS — E559 Vitamin D deficiency, unspecified: Secondary | ICD-10-CM | POA: Insufficient documentation

## 2018-11-17 DIAGNOSIS — M8580 Other specified disorders of bone density and structure, unspecified site: Secondary | ICD-10-CM | POA: Diagnosis not present

## 2018-11-17 DIAGNOSIS — M1990 Unspecified osteoarthritis, unspecified site: Secondary | ICD-10-CM | POA: Insufficient documentation

## 2018-11-17 DIAGNOSIS — X501XXA Overexertion from prolonged static or awkward postures, initial encounter: Secondary | ICD-10-CM | POA: Insufficient documentation

## 2018-11-17 DIAGNOSIS — S82852A Displaced trimalleolar fracture of left lower leg, initial encounter for closed fracture: Secondary | ICD-10-CM | POA: Insufficient documentation

## 2018-11-17 DIAGNOSIS — M3500 Sicca syndrome, unspecified: Secondary | ICD-10-CM | POA: Diagnosis not present

## 2018-11-17 DIAGNOSIS — G8918 Other acute postprocedural pain: Secondary | ICD-10-CM | POA: Diagnosis not present

## 2018-11-17 DIAGNOSIS — F418 Other specified anxiety disorders: Secondary | ICD-10-CM | POA: Diagnosis not present

## 2018-11-17 HISTORY — DX: Anxiety disorder, unspecified: F41.9

## 2018-11-17 HISTORY — DX: Depression, unspecified: F32.A

## 2018-11-17 HISTORY — PX: ORIF ANKLE FRACTURE: SHX5408

## 2018-11-17 HISTORY — DX: Unspecified osteoarthritis, unspecified site: M19.90

## 2018-11-17 HISTORY — DX: Family history of other specified conditions: Z84.89

## 2018-11-17 HISTORY — DX: Nausea with vomiting, unspecified: R11.2

## 2018-11-17 HISTORY — DX: Other complications of anesthesia, initial encounter: T88.59XA

## 2018-11-17 HISTORY — DX: Cardiac murmur, unspecified: R01.1

## 2018-11-17 HISTORY — DX: Other specified postprocedural states: Z98.890

## 2018-11-17 LAB — CBC
HCT: 35.8 % — ABNORMAL LOW (ref 36.0–46.0)
Hemoglobin: 12.2 g/dL (ref 12.0–15.0)
MCH: 30.1 pg (ref 26.0–34.0)
MCHC: 34.1 g/dL (ref 30.0–36.0)
MCV: 88.4 fL (ref 80.0–100.0)
Platelets: 350 10*3/uL (ref 150–400)
RBC: 4.05 MIL/uL (ref 3.87–5.11)
RDW: 12.3 % (ref 11.5–15.5)
WBC: 9.7 10*3/uL (ref 4.0–10.5)
nRBC: 0 % (ref 0.0–0.2)

## 2018-11-17 LAB — BASIC METABOLIC PANEL
Anion gap: 14 (ref 5–15)
BUN: 6 mg/dL — ABNORMAL LOW (ref 8–23)
CO2: 22 mmol/L (ref 22–32)
Calcium: 9.3 mg/dL (ref 8.9–10.3)
Chloride: 99 mmol/L (ref 98–111)
Creatinine, Ser: 0.79 mg/dL (ref 0.44–1.00)
GFR calc Af Amer: >60 mL/min (ref >60)
GFR calc non Af Amer: >60 mL/min (ref >60)
Glucose, Bld: 129 mg/dL — ABNORMAL HIGH (ref 70–99)
Potassium: 3.1 mmol/L — ABNORMAL LOW (ref 3.5–5.1)
Sodium: 135 mmol/L (ref 135–145)

## 2018-11-17 SURGERY — OPEN REDUCTION INTERNAL FIXATION (ORIF) ANKLE FRACTURE
Anesthesia: General | Site: Ankle | Laterality: Left

## 2018-11-17 MED ORDER — LIDOCAINE 2% (20 MG/ML) 5 ML SYRINGE
INTRAMUSCULAR | Status: DC | PRN
  Administered 2018-11-17: 80 mg via INTRAVENOUS

## 2018-11-17 MED ORDER — SCOPOLAMINE 1 MG/3DAYS TD PT72
1.0000 | MEDICATED_PATCH | Freq: Once | TRANSDERMAL | Status: DC
  Administered 2018-11-17: 11:00:00 1.5 mg via TRANSDERMAL
  Filled 2018-11-17: qty 1

## 2018-11-17 MED ORDER — DIPHENHYDRAMINE HCL 50 MG/ML IJ SOLN
INTRAMUSCULAR | Status: AC
  Filled 2018-11-17: qty 1

## 2018-11-17 MED ORDER — MIDAZOLAM HCL 2 MG/2ML IJ SOLN
2.0000 mg | Freq: Once | INTRAMUSCULAR | Status: AC
  Administered 2018-11-17: 2 mg via INTRAVENOUS

## 2018-11-17 MED ORDER — FENTANYL CITRATE (PF) 100 MCG/2ML IJ SOLN
INTRAMUSCULAR | Status: AC
  Filled 2018-11-17: qty 2

## 2018-11-17 MED ORDER — OXYCODONE-ACETAMINOPHEN 5-325 MG PO TABS: 1.0000 | ORAL_TABLET | ORAL | 0 refills | Status: DC | PRN

## 2018-11-17 MED ORDER — POVIDONE-IODINE 10 % EX SWAB: 2.0000 "application " | Freq: Once | CUTANEOUS | Status: DC

## 2018-11-17 MED ORDER — CEFAZOLIN SODIUM-DEXTROSE 2-4 GM/100ML-% IV SOLN
2.0000 g | INTRAVENOUS | Status: AC
  Administered 2018-11-17: 12:00:00 2 g via INTRAVENOUS

## 2018-11-17 MED ORDER — OXYCODONE-ACETAMINOPHEN 5-325 MG PO TABS
1.0000 | ORAL_TABLET | ORAL | Status: DC | PRN
  Administered 2018-11-17: 14:00:00 1 via ORAL

## 2018-11-17 MED ORDER — PHENYLEPHRINE 40 MCG/ML (10ML) SYRINGE FOR IV PUSH (FOR BLOOD PRESSURE SUPPORT)
PREFILLED_SYRINGE | INTRAVENOUS | Status: AC
  Filled 2018-11-17: qty 10

## 2018-11-17 MED ORDER — CHLORHEXIDINE GLUCONATE 4 % EX LIQD: 60.0000 mL | Freq: Once | CUTANEOUS | Status: DC

## 2018-11-17 MED ORDER — CLONIDINE HCL (ANALGESIA) 100 MCG/ML EP SOLN
EPIDURAL | Status: DC | PRN
  Administered 2018-11-17: 100 ug

## 2018-11-17 MED ORDER — FENTANYL CITRATE (PF) 100 MCG/2ML IJ SOLN
50.0000 ug | Freq: Once | INTRAMUSCULAR | Status: AC
  Administered 2018-11-17: 11:00:00 50 ug via INTRAVENOUS

## 2018-11-17 MED ORDER — ONDANSETRON HCL 4 MG/2ML IJ SOLN
INTRAMUSCULAR | Status: AC
  Filled 2018-11-17: qty 2

## 2018-11-17 MED ORDER — DEXAMETHASONE SODIUM PHOSPHATE 10 MG/ML IJ SOLN
INTRAMUSCULAR | Status: DC | PRN
  Administered 2018-11-17: 10 mg via INTRAVENOUS

## 2018-11-17 MED ORDER — MIDAZOLAM HCL 2 MG/2ML IJ SOLN
INTRAMUSCULAR | Status: AC
  Administered 2018-11-17: 11:00:00 2 mg via INTRAVENOUS
  Filled 2018-11-17: qty 2

## 2018-11-17 MED ORDER — OXYCODONE-ACETAMINOPHEN 5-325 MG PO TABS
ORAL_TABLET | ORAL | Status: AC
  Filled 2018-11-17: qty 1

## 2018-11-17 MED ORDER — CEFAZOLIN SODIUM-DEXTROSE 2-4 GM/100ML-% IV SOLN
INTRAVENOUS | Status: AC
  Filled 2018-11-17: qty 100

## 2018-11-17 MED ORDER — PROPOFOL 10 MG/ML IV BOLUS
INTRAVENOUS | Status: DC | PRN
  Administered 2018-11-17: 130 mg via INTRAVENOUS
  Administered 2018-11-17: 30 mg via INTRAVENOUS

## 2018-11-17 MED ORDER — DIPHENHYDRAMINE HCL 50 MG/ML IJ SOLN
INTRAMUSCULAR | Status: DC | PRN
  Administered 2018-11-17: 6.25 mg via INTRAVENOUS

## 2018-11-17 MED ORDER — 0.9 % SODIUM CHLORIDE (POUR BTL) OPTIME
TOPICAL | Status: DC | PRN
  Administered 2018-11-17: 12:00:00 1000 mL

## 2018-11-17 MED ORDER — FENTANYL CITRATE (PF) 100 MCG/2ML IJ SOLN
INTRAMUSCULAR | Status: AC
  Administered 2018-11-17: 11:00:00 50 ug via INTRAVENOUS
  Filled 2018-11-17: qty 2

## 2018-11-17 MED ORDER — ONDANSETRON HCL 4 MG/2ML IJ SOLN
INTRAMUSCULAR | Status: DC | PRN
  Administered 2018-11-17: 4 mg via INTRAVENOUS

## 2018-11-17 MED ORDER — ACETAMINOPHEN 500 MG PO TABS: 1000.0000 mg | ORAL_TABLET | Freq: Once | ORAL | Status: DC

## 2018-11-17 MED ORDER — ENSURE PRE-SURGERY PO LIQD
296.0000 mL | Freq: Once | ORAL | Status: DC
  Filled 2018-11-17: qty 296

## 2018-11-17 MED ORDER — PROPOFOL 10 MG/ML IV BOLUS
INTRAVENOUS | Status: AC
  Filled 2018-11-17: qty 20

## 2018-11-17 MED ORDER — FENTANYL CITRATE (PF) 250 MCG/5ML IJ SOLN
INTRAMUSCULAR | Status: AC
  Filled 2018-11-17: qty 5

## 2018-11-17 MED ORDER — LACTATED RINGERS IV SOLN
INTRAVENOUS | Status: DC
  Administered 2018-11-17: 11:00:00 via INTRAVENOUS

## 2018-11-17 MED ORDER — DEXAMETHASONE SODIUM PHOSPHATE 10 MG/ML IJ SOLN
INTRAMUSCULAR | Status: AC
  Filled 2018-11-17: qty 1

## 2018-11-17 MED ORDER — FENTANYL CITRATE (PF) 100 MCG/2ML IJ SOLN
25.0000 ug | INTRAMUSCULAR | Status: DC | PRN
  Administered 2018-11-17: 25 ug via INTRAVENOUS

## 2018-11-17 MED ORDER — MIDAZOLAM HCL 2 MG/2ML IJ SOLN
INTRAMUSCULAR | Status: AC
  Filled 2018-11-17: qty 2

## 2018-11-17 MED ORDER — PHENYLEPHRINE 40 MCG/ML (10ML) SYRINGE FOR IV PUSH (FOR BLOOD PRESSURE SUPPORT)
PREFILLED_SYRINGE | INTRAVENOUS | Status: DC | PRN
  Administered 2018-11-17: 80 ug via INTRAVENOUS
  Administered 2018-11-17: 120 ug via INTRAVENOUS
  Administered 2018-11-17: 80 ug via INTRAVENOUS
  Administered 2018-11-17: 120 ug via INTRAVENOUS

## 2018-11-17 MED ORDER — ONDANSETRON HCL 4 MG/2ML IJ SOLN: 4.0000 mg | Freq: Once | INTRAMUSCULAR | Status: DC | PRN

## 2018-11-17 MED ORDER — BUPIVACAINE-EPINEPHRINE (PF) 0.5% -1:200000 IJ SOLN
INTRAMUSCULAR | Status: DC | PRN
  Administered 2018-11-17: 20 mL via PERINEURAL
  Administered 2018-11-17: 10 mL via PERINEURAL

## 2018-11-17 SURGICAL SUPPLY — 47 items
BANDAGE ESMARK 6X9 LF (GAUZE/BANDAGES/DRESSINGS) IMPLANT
BNDG CMPR 9X6 STRL LF SNTH (GAUZE/BANDAGES/DRESSINGS)
BNDG COHESIVE 4X5 TAN STRL (GAUZE/BANDAGES/DRESSINGS) ×3 IMPLANT
BNDG ESMARK 6X9 LF (GAUZE/BANDAGES/DRESSINGS)
BNDG GAUZE ELAST 4 BULKY (GAUZE/BANDAGES/DRESSINGS) ×3 IMPLANT
COVER SURGICAL LIGHT HANDLE (MISCELLANEOUS) ×3 IMPLANT
COVER WAND RF STERILE (DRAPES) ×1 IMPLANT
DRAPE OEC MINIVIEW 54X84 (DRAPES) ×2 IMPLANT
DRAPE U-SHAPE 47X51 STRL (DRAPES) ×3 IMPLANT
DRSG ADAPTIC 3X8 NADH LF (GAUZE/BANDAGES/DRESSINGS) ×3 IMPLANT
DRSG PAD ABDOMINAL 8X10 ST (GAUZE/BANDAGES/DRESSINGS) ×3 IMPLANT
DURAPREP 26ML APPLICATOR (WOUND CARE) ×3 IMPLANT
ELECT REM PT RETURN 9FT ADLT (ELECTROSURGICAL) ×3
ELECTRODE REM PT RTRN 9FT ADLT (ELECTROSURGICAL) ×1 IMPLANT
GAUZE SPONGE 4X4 12PLY STRL (GAUZE/BANDAGES/DRESSINGS) ×3 IMPLANT
GLOVE BIOGEL PI IND STRL 9 (GLOVE) ×1 IMPLANT
GLOVE BIOGEL PI INDICATOR 9 (GLOVE) ×2
GLOVE SURG ORTHO 9.0 STRL STRW (GLOVE) ×3 IMPLANT
GOWN STRL REUS W/ TWL XL LVL3 (GOWN DISPOSABLE) ×3 IMPLANT
GOWN STRL REUS W/TWL XL LVL3 (GOWN DISPOSABLE) ×9
KIT BASIN OR (CUSTOM PROCEDURE TRAY) ×3 IMPLANT
KIT TURNOVER KIT B (KITS) ×3 IMPLANT
MANIFOLD NEPTUNE II (INSTRUMENTS) ×3 IMPLANT
NS IRRIG 1000ML POUR BTL (IV SOLUTION) ×3 IMPLANT
PACK ORTHO EXTREMITY (CUSTOM PROCEDURE TRAY) ×3 IMPLANT
PAD ABD 8X10 STRL (GAUZE/BANDAGES/DRESSINGS) ×4 IMPLANT
PAD ARMBOARD 7.5X6 YLW CONV (MISCELLANEOUS) ×6 IMPLANT
PLATE LCP 3.5 1/3 TUB 7HX81 (Plate) ×2 IMPLANT
SCREW CANN S THRD/44 4.0 (Screw) ×4 IMPLANT
SCREW CORTEX 3.5 12MM (Screw) ×4 IMPLANT
SCREW CORTEX 3.5 14MM (Screw) ×2 IMPLANT
SCREW CORTEX 3.5 20MM (Screw) ×2 IMPLANT
SCREW LOCK CORT ST 3.5X12 (Screw) IMPLANT
SCREW LOCK CORT ST 3.5X14 (Screw) IMPLANT
SCREW LOCK CORT ST 3.5X20 (Screw) IMPLANT
SCREW LOCK T15 FT 14X3.5X2.9X (Screw) IMPLANT
SCREW LOCKING 3.5X14 (Screw) ×6 IMPLANT
STAPLER VISISTAT 35W (STAPLE) ×2 IMPLANT
SUCTION FRAZIER HANDLE 10FR (MISCELLANEOUS) ×2
SUCTION TUBE FRAZIER 10FR DISP (MISCELLANEOUS) ×1 IMPLANT
SUT ETHILON 2 0 PSLX (SUTURE) ×2 IMPLANT
SUT VIC AB 2-0 CT1 27 (SUTURE) ×3
SUT VIC AB 2-0 CT1 TAPERPNT 27 (SUTURE) ×1 IMPLANT
TOWEL GREEN STERILE (TOWEL DISPOSABLE) ×3 IMPLANT
TOWEL GREEN STERILE FF (TOWEL DISPOSABLE) ×3 IMPLANT
TUBE CONNECTING 12'X1/4 (SUCTIONS) ×1
TUBE CONNECTING 12X1/4 (SUCTIONS) ×2 IMPLANT

## 2018-11-17 NOTE — Anesthesia Postprocedure Evaluation (Signed)
Anesthesia Post Note  Patient: Bianca Fernandez  Procedure(s) Performed: OPEN REDUCTION INTERNAL FIXATION (ORIF) LEFT ANKLE FRACTURE (Left Ankle)     Patient location during evaluation: PACU Anesthesia Type: General Level of consciousness: awake and alert Pain management: pain level controlled Vital Signs Assessment: post-procedure vital signs reviewed and stable Respiratory status: spontaneous breathing, nonlabored ventilation, respiratory function stable and patient connected to nasal cannula oxygen Cardiovascular status: blood pressure returned to baseline and stable Postop Assessment: no apparent nausea or vomiting Anesthetic complications: no    Last Vitals:  Vitals:   11/17/18 1355 11/17/18 1410  BP: (!) 141/84 (!) 147/83  Pulse: 94   Resp: (!) 21 20  Temp:  36.5 C  SpO2: 96% 95%    Last Pain:  Vitals:   11/17/18 1410  TempSrc:   PainSc: [place]

## 2018-11-17 NOTE — Progress Notes (Signed)
75 mcg/1.5 cc fentanyl IV solution wasted in stericycle with heather bowman RN. (Pt DC'd before waste could be documented in pyxis)

## 2018-11-17 NOTE — Op Note (Signed)
11/17/2018  1:10 PM  PATIENT:  Bianca Fernandez    PRE-OPERATIVE DIAGNOSIS:  Trimalleolar Left Ankle Fracture  POST-OPERATIVE DIAGNOSIS:  Same  PROCEDURE:  OPEN REDUCTION INTERNAL FIXATION (ORIF) LEFT ANKLE FRACTURE   SURGEON:  Newt Minion, MD  PHYSICIAN ASSISTANT:None ANESTHESIA:   General  PREOPERATIVE INDICATIONS:  [name] is a  63 y.o. female with a diagnosis of Trimalleolar Left Ankle Fracture who failed conservative measures and elected for surgical management.    The risks benefits and alternatives were discussed with the patient preoperatively including but not limited to the risks of infection, bleeding, nerve injury, cardiopulmonary complications, the need for revision surgery, among others, and the patient was willing to proceed.  OPERATIVE IMPLANTS: Synthes 7 hole one third tubular plate  @ENCIMAGES @  OPERATIVE FINDINGS: C-arm fluoroscopy verified alignment in AP and lateral planes  OPERATIVE PROCEDURE: Patient was brought the operating room and underwent a general anesthetic.  After adequate levels anesthesia were obtained patient's left lower extremity was prepped using DuraPrep draped into a sterile field a timeout was called.  A lateral incision was made over the fibula.  This was carried sharply down to bone.  Subperiosteal dissection was used to cleanse the fracture site this was opened debrided irrigated.  The fracture was reduced the fibular length was restored and an interfrag screw was placed.  A posterior lateral antiglide plate was placed with 3 compression screws proximally 2 locking screws distally.  C-arm fluoroscopy verified alignment of the fibula.  Incision was made medially longitudinally over the medial malleolus.  Periosteal tissue was removed from the fracture site.  The fracture was opened and cleansed reduced and 2 K wires were used to stabilize the medial malleolar fracture.  C-arm fluoroscopy verified alignment and 44 mm cannulated screws were  placed over the wire.  All wounds were irrigated subcu was closed in 2-0 Vicryl skin was closed using staples.  A sterile compressive dressing was applied.  Patient was extubated taken to PACU in stable condition.   DISCHARGE PLANNING:  Antibiotic duration: Preoperative  Weightbearing: Touchdown weightbearing on the left  Pain medication: Prescription for Percocet  Dressing care/ Wound VAC: Follow-up in 1 week to change the dressing  Ambulatory devices: Walker and kneeling scooter  Discharge to: Home.  Follow-up: In the office 1 week post operative.

## 2018-11-17 NOTE — H&P (Signed)
Bianca Fernandez is an 63 y.o. female.   Chief Complaint: left ankle pain and deformity HPI: Patient is a 63 year old woman who presents for initial evaluation for trimalleolar left ankle fracture.  Patient states she was looking both ways to cross the street when she twisted her ankle on a pothole.  Patient states her pain is a 8-10 complains of swelling.  Past Medical History:  Diagnosis Date  . Anemia   . Anxiety    situational  . Arthritis   . Complication of anesthesia   . Depression   . Family history of adverse reaction to anesthesia    Mother - nausea, daughter nausea  . GERD (gastroesophageal reflux disease)   . Heart murmur   . Migraines   . Osteopenia   . PONV (postoperative nausea and vomiting) 1976, 2001   Nausea   . Sjogren's disease (HCC)   . Unspecified vitamin D deficiency   . VSD (ventricular septal defect), perimembranous     Past Surgical History:  Procedure Laterality Date  . ABLATION ON ENDOMETRIOSIS    . cervical conization  1976  . COLONOSCOPY  2012  . DILATION AND CURETTAGE OF UTERUS     x 2  . ESOPHAGOGASTRODUODENOSCOPY  01/28/13   Dr Bosie ClosSchooler- dysmotility  . lip biopsy     + sjogrens    Family History  Problem Relation Age of Onset  . Hypertension Mother   . Stroke Mother   . Hypertension Father   . Stroke Father   . Bladder Cancer Father   . Breast cancer Cousin   . Ovarian cancer Maternal Aunt   . Uterine cancer Maternal Grandmother   . ALS Other   . Multiple sclerosis Cousin   . Parkinson's disease Cousin    Social History:  reports that she has never smoked. She has never used smokeless tobacco. She reports current alcohol use. She reports that she does not use drugs.  Allergies:  Allergies  Allergen Reactions  . Estradiol     Patch caused itching     No medications prior to admission.    Results for orders placed or performed during the hospital encounter of 11/15/18 (from the past 48 hour(s))  SARS Coronavirus 2 (Performed  [facility])     Status: None   Collection Time: 11/15/18 12:37 PM   Specimen: Nasal Swab  Result Value Ref Range   SARS Coronavirus 2 NEGATIVE NEGATIVE    Comment: (NOTE) SARS-CoV-2 target nucleic acids are NOT DETECTED. The SARS-CoV-2 RNA is generally detectable in upper and lower respiratory specimens during the acute phase of infection. Negative results do not preclude SARS-CoV-2 infection, do not rule out co-infections with other pathogens, and should not be used as the sole basis for treatment or other patient management decisions. Negative results must be combined with clinical observations, patient history, and epidemiological information. The expected result is Negative. Fact Sheet for Patients: HairSlick.nohttps://www.fda.gov/media/138098/download Fact Sheet for Healthcare Providers: quierodirigir.comhttps://www.fda.gov/media/138095/download This test is not yet approved or cleared by the [place] FDA and  has been authorized for detection and/or diagnosis of SARS-CoV-2 by FDA under an Emergency Use Authorization (EUA). This EUA will remain  in effect (meaning this test can be used) for the duration of the COVID-19 declaration under Section 56 4(b)(1) of the Act, 21 U.S.C. section 360bbb-3(b)(1), unless the authorization is terminated or revoked sooner. Performed at [facility], [address] [place], [place] [address]    No results found.  Review of Systems  All other systems reviewed and are negative.   There were no vitals taken for this visit. Physical Exam  Patient is alert, oriented, no adenopathy, well-dressed, normal affect, normal respiratory effort. Examination patient has bruising and swelling of the ankle and foot on the left.  She has a good dorsalis pedis pulse her foot is neurovascular intact.  Radiographs shows a trimalleolar ankle fracture Weber B fibular fracture with displacement. Assessment/Plan 1. Triplanar ankle fracture, closed, left,  initial encounter     Plan: We will plan for open reduction internal fixation of the left ankle on Wednesday at [facility].  Risk and benefits were discussed including infection neurovascular injury pain arthritis need for additional surgery.  Patient states she understands wished to proceed at this time recommended getting a walker kneeling scooter we will place her in a fracture boot.   Newt Minion, MD 11/17/2018, 6:56 AM

## 2018-11-17 NOTE — Anesthesia Procedure Notes (Signed)
Anesthesia Regional Block: Adductor canal block   Pre-Anesthetic Checklist: ,, timeout performed, Correct Patient, Correct Site, Correct Laterality, Correct Procedure, Correct Position, site marked, Risks and benefits discussed,  Surgical consent,  Pre-op evaluation,  At surgeon's request and post-op pain management  Laterality: Left  Prep: chloraprep       Needles:  Injection technique: Single-shot  Needle Type: Echogenic Needle     Needle Length: 9cm  Needle Gauge: 21     Additional Needles:   Procedures:,,,, ultrasound used (permanent image in chart),,,,  Narrative:  Start time: 11/17/2018 10:50 AM End time: 11/17/2018 10:55 AM Injection made incrementally with aspirations every 5 mL.  Performed by: Personally  Anesthesiologist: Catalina Gravel, MD  Additional Notes: No pain on injection. No increased resistance to injection. Injection made in 5cc increments.  Good needle visualization.  Patient tolerated procedure well.

## 2018-11-17 NOTE — Anesthesia Procedure Notes (Signed)
Procedure Name: LMA Insertion Date/Time: 11/17/2018 12:16 PM Performed by: Barrington Ellison, CRNA Pre-anesthesia Checklist: Patient identified, Emergency Drugs available, Suction available and Patient being monitored Patient Re-evaluated:Patient Re-evaluated prior to induction Oxygen Delivery Method: Circle System Utilized Preoxygenation: Pre-oxygenation with 100% oxygen Induction Type: IV induction LMA: LMA inserted LMA Size: 4.0 Number of attempts: 1 Placement Confirmation: positive ETCO2 Tube secured with: Tape Dental Injury: Teeth and Oropharynx as per pre-operative assessment

## 2018-11-17 NOTE — Anesthesia Procedure Notes (Signed)
Anesthesia Regional Block: Popliteal block   Pre-Anesthetic Checklist: ,, timeout performed, Correct Patient, Correct Site, Correct Laterality, Correct Procedure, Correct Position, site marked, Risks and benefits discussed,  Surgical consent,  Pre-op evaluation,  At surgeon's request and post-op pain management  Laterality: Left  Prep: chloraprep       Needles:  Injection technique: Single-shot  Needle Type: Echogenic Needle     Needle Length: 9cm  Needle Gauge: 21     Additional Needles:   Procedures:,,,, ultrasound used (permanent image in chart),,,,  Narrative:  Start time: 11/17/2018 10:39 AM End time: 11/17/2018 10:49 AM Injection made incrementally with aspirations every 5 mL.  Performed by: Personally  Anesthesiologist: Catalina Gravel, MD  Additional Notes: No pain on injection. No increased resistance to injection. Injection made in 5cc increments.  Good needle visualization.  Patient tolerated procedure well.

## 2018-11-17 NOTE — Transfer of Care (Signed)
Immediate Anesthesia Transfer of Care Note  Patient: Bianca Fernandez  Procedure(s) Performed: OPEN REDUCTION INTERNAL FIXATION (ORIF) LEFT ANKLE FRACTURE (Left Ankle)  Patient Location: PACU  Anesthesia Type:General  Level of Consciousness: awake, alert  and oriented  Airway & Oxygen Therapy: Patient Spontanous Breathing  Post-op Assessment: Report given to RN  Post vital signs: Reviewed and stable  Last Vitals:  Vitals Value Taken Time  BP    Temp    Pulse 88 11/17/18 1309  Resp 31 11/17/18 1309  SpO2 98 % 11/17/18 1309  Vitals shown include unvalidated device data.  Last Pain:  Vitals:   11/17/18 1100  TempSrc:   PainSc: 0-No pain         Complications: No apparent anesthesia complications

## 2018-11-19 ENCOUNTER — Encounter (HOSPITAL_COMMUNITY): Payer: Self-pay | Admitting: Orthopedic Surgery

## 2018-11-22 ENCOUNTER — Telehealth: Payer: Self-pay

## 2018-11-22 NOTE — Telephone Encounter (Signed)
Patient had left a VM stating that she could not get her left foot into her CAM Boot due to swelling.  Patient had ORIF, left foot on Wednesday, 11/17/2018 with Dr. Sharol Given.  Talked with Shawn, Dr. Jess Barters PA and advised her of patient's message and she stated patient could come into the office today and if not, for patient to elevate, ice, and ace wrap left foot to help with the swelling. Advised patient of Shawn's message above and patient stated that she would continue to elevate and ice and come to her appointment on Wednesday, 11/24/2018.

## 2018-11-24 ENCOUNTER — Encounter: Payer: Self-pay | Admitting: Family

## 2018-11-24 ENCOUNTER — Other Ambulatory Visit: Payer: Self-pay

## 2018-11-24 ENCOUNTER — Ambulatory Visit (INDEPENDENT_AMBULATORY_CARE_PROVIDER_SITE_OTHER): Payer: BC Managed Care – PPO | Admitting: Family

## 2018-11-24 VITALS — Ht 68.0 in | Wt 173.4 lb

## 2018-11-24 DIAGNOSIS — S82852D Displaced trimalleolar fracture of left lower leg, subsequent encounter for closed fracture with routine healing: Secondary | ICD-10-CM

## 2018-11-24 MED ORDER — OXYCODONE-ACETAMINOPHEN 5-325 MG PO TABS: 1.0000 | ORAL_TABLET | Freq: Four times a day (QID) | ORAL | 0 refills | Status: DC | PRN

## 2018-11-24 NOTE — Progress Notes (Signed)
Post-Op Visit Note   Patient: Bianca Fernandez           Date of Birth: [birth year]           MRN: [id number] Visit Date: 11/24/2018 PCP: Lucky CowboyMcKeown, William, MD  Chief Complaint:  Chief Complaint  Patient presents with  . Left Ankle - Routine Post Op    11/17/2018 ORIF left ankle Fx    HPI:  HPI The patient is a 63 year old woman who presents today status post open reduction internal fixation left ankle fracture on July 8 of this year.  She is complaining of some bruising surrounding her incisions as well as swelling.  Some pins and needle sensation in her foot.  Has been trying to elevate.  Husband accompanies the visit.  She does report she needs a refill of her pain medication. Ortho Exam On examination incisions are well approximated with staples there is no drainage no surrounding erythema no odor no sign of infection however she does have moderate to severe swelling of her foot and ankle.  This is tender.  She is developing a heel cord contracture as well.  She has not been wearing her cam walker as she feels she is too swollen to fit in her boot.  Visit Diagnoses:  1. Closed trimalleolar fracture of left ankle with routine healing, subsequent encounter     Plan: Begin daily Dial soap cleansing dry dressing changes.  Discussed elevation and compression for swelling.  Reassurance provided for pain and swelling did provide a new cam walker size large discussed the importance of working on heel cord stretching as well as wearing the cam walker to provide neutral angle of the ankle. She will follow-up with Dr. Lajoyce Cornersuda in 1 week radiographs of the ankle at follow-up.  Follow-Up Instructions: No follow-ups on file.   Imaging: No results found.  Orders:  No orders of the defined types were placed in this encounter.  Meds ordered this encounter  Medications  . oxyCODONE-acetaminophen (PERCOCET/ROXICET) 5-325 MG tablet    Sig: Take 1 tablet by mouth every 6 (six) hours as needed.   Dispense:  30 tablet    Refill:  0     PMFS History: Patient Active Problem List   Diagnosis Date Noted  . Closed trimalleolar fracture of ankle, left, initial encounter 11/15/2018  . Anxiety 04/27/2017  . Rectocele, female 09/12/2014  . Cystocele with prolapse 09/12/2014  . Prediabetes 09/12/2014  . Hyperlipidemia 09/12/2014  . Elevated blood pressure reading without diagnosis of hypertension 09/12/2014  . Sjogren's disease (HCC)   . VSD (ventricular septal defect), perimembranous   . Osteopenia   . Vitamin D deficiency   . Migraines    Past Medical History:  Diagnosis Date  . Anemia   . Anxiety    situational  . Arthritis   . Complication of anesthesia   . Depression   . Family history of adverse reaction to anesthesia    Mother - nausea, daughter nausea  . GERD (gastroesophageal reflux disease)   . Heart murmur   . Migraines   . Osteopenia   . PONV (postoperative nausea and vomiting) 1976, 2001   Nausea   . Sjogren's disease (HCC)   . Unspecified vitamin D deficiency   . VSD (ventricular septal defect), perimembranous     Family History  Problem Relation Age of Onset  . Hypertension Mother   . Stroke Mother   . Hypertension Father   . Stroke Father   . Bladder Cancer  Father   . Breast cancer Cousin   . Ovarian cancer Maternal Aunt   . Uterine cancer Maternal Grandmother   . ALS Other   . Multiple sclerosis Cousin   . Parkinson's disease Cousin     Past Surgical History:  Procedure Laterality Date  . ABLATION ON ENDOMETRIOSIS    . cervical conization  1976  . COLONOSCOPY  2012  . DILATION AND CURETTAGE OF UTERUS     x 2  . ESOPHAGOGASTRODUODENOSCOPY  01/28/13   Dr Michail Sermon- dysmotility  . lip biopsy     + sjogrens  . ORIF ANKLE FRACTURE Left 11/17/2018   Procedure: OPEN REDUCTION INTERNAL FIXATION (ORIF) LEFT ANKLE FRACTURE;  Surgeon: Newt Minion, MD;  Location: [facility];  Service: Orthopedics;  Laterality: Left;   Social History   Occupational  History  . Not on file  Tobacco Use  . Smoking status: Never Smoker  . Smokeless tobacco: Never Used  Substance and Sexual Activity  . Alcohol use: Yes    Comment: rare  . Drug use: No  . Sexual activity: Not on file

## 2018-11-29 ENCOUNTER — Ambulatory Visit (INDEPENDENT_AMBULATORY_CARE_PROVIDER_SITE_OTHER): Payer: BC Managed Care – PPO

## 2018-11-29 ENCOUNTER — Ambulatory Visit (INDEPENDENT_AMBULATORY_CARE_PROVIDER_SITE_OTHER): Payer: BC Managed Care – PPO | Admitting: Orthopedic Surgery

## 2018-11-29 ENCOUNTER — Encounter: Payer: Self-pay | Admitting: Orthopedic Surgery

## 2018-11-29 VITALS — Ht 68.0 in | Wt 173.0 lb

## 2018-11-29 DIAGNOSIS — S82852D Displaced trimalleolar fracture of left lower leg, subsequent encounter for closed fracture with routine healing: Secondary | ICD-10-CM

## 2018-11-30 ENCOUNTER — Other Ambulatory Visit: Payer: Self-pay | Admitting: Orthopedic Surgery

## 2018-11-30 ENCOUNTER — Telehealth: Payer: Self-pay | Admitting: Orthopedic Surgery

## 2018-11-30 ENCOUNTER — Telehealth: Payer: Self-pay | Admitting: Physician Assistant

## 2018-11-30 ENCOUNTER — Other Ambulatory Visit: Payer: Self-pay | Admitting: Physician Assistant

## 2018-11-30 DIAGNOSIS — I6529 Occlusion and stenosis of unspecified carotid artery: Secondary | ICD-10-CM

## 2018-11-30 MED ORDER — OXYCODONE-ACETAMINOPHEN 5-325 MG PO TABS: 1.0000 | ORAL_TABLET | Freq: Four times a day (QID) | ORAL | 0 refills | Status: DC | PRN

## 2018-11-30 NOTE — Telephone Encounter (Signed)
rx sent to [facility]

## 2018-11-30 NOTE — Progress Notes (Signed)
Future Appointments  Date Time Provider [facility]  12/13/2018 12:45 PM Newt Minion, MD [facility] None  06/08/2019 10:00 AM Vicie Mutters, PA-C [facility] None

## 2018-11-30 NOTE — Telephone Encounter (Signed)
Patient called to get a refill of Oxychodone.  Please call patient to advise.  [phone]

## 2018-11-30 NOTE — Telephone Encounter (Signed)
Recent Visits Date Type Provider Dept  11/11/18 Office Visit Liane Comber, NP [facility]  08/02/18 Office Visit Vicie Mutters, PA-C [facility]  06/03/18 Office Visit Vicie Mutters, PA-C [facility] recent visits within past 540 days with a meds authorizing provider and meeting all other requirements   Future Appointments No visits were found meeting these conditions.  Showing future appointments within next 150 days with a meds authorizing provider and meeting all other requirements   Needs future OV in 3 months

## 2018-12-05 ENCOUNTER — Encounter: Payer: Self-pay | Admitting: Orthopedic Surgery

## 2018-12-05 NOTE — Progress Notes (Signed)
Office Visit Note   Patient: Bianca Fernandez           Date of Birth: 11/14/1955           MRN: [id number] Visit Date: 11/29/2018              Requested by: Lucky CowboyMcKeown, William, MD [address] [place] [id number] PCP: Lucky CowboyMcKeown, William, MD  Chief Complaint  Patient presents with  . Left Ankle - Routine Post Op    11/17/2018 ORIF left ankle       HPI: Patient is a 63 year old woman who presents 2 weeks status post open reduction internal fixation left ankle trimalleolar fracture.  Patient states she has swelling with burning pain at night.  Assessment & Plan: Visit Diagnoses:  1. Closed trimalleolar fracture of left ankle with routine healing, subsequent encounter     Plan: Patient's calf measures 34 cm in circumference recommended compression stocking and elevation as well as calf pumps to decrease swelling.  Follow-Up Instructions: Return in about 2 weeks (around 12/13/2018).   Ortho Exam  Patient is alert, oriented, no adenopathy, well-dressed, normal affect, normal respiratory effort. Examination the incision is healed well there is no redness no cellulitis no signs of infection the staples are harvested.  Patient does have swelling her calf measures 34 cm in circumference.  Imaging: No results found. No images are attached to the encounter.  Labs: Lab Results  Component Value Date   HGBA1C 5.6 06/03/2018   HGBA1C 5.5 05/26/2016   HGBA1C 5.9 (H) 05/24/2015   LABORGA Multiple bacterial morphotypes present, none 05/17/2014   LABORGA predominant. Suggest appropriate recollection if  05/17/2014   LABORGA clinically indicated. 05/17/2014     Lab Results  Component Value Date   ALBUMIN 4.6 05/26/2016   ALBUMIN 4.6 05/24/2015   ALBUMIN 4.5 09/12/2014    Lab Results  Component Value Date   MG 2.0 06/03/2018   MG 1.9 05/26/2017   MG 1.9 05/24/2015   Lab Results  Component Value Date   VD25OH 38 06/03/2018   VD25OH 35 05/26/2017   VD25OH  40 05/24/2015    No results found for: PREALBUMIN CBC EXTENDED Latest Ref Rng & Units 11/17/2018 08/02/2018 06/03/2018  WBC 4.0 - 10.5 K/uL 9.7 6.0 8.0  RBC 3.87 - 5.11 MIL/uL 4.05 4.12 4.45  HGB 12.0 - 15.0 g/dL 95.212.2 84.112.3 32.413.2  HCT 40.136.0 - 46.0 % 35.8(L) 36.4 39.2  PLT 150 - 400 K/uL 350 285 342  NEUTROABS 1,500 - 7,800 cells/uL - 3,564 5,576  LYMPHSABS 850 - 3,900 cells/uL - 1,560 1,696     Body mass index is 26.3 kg/m.  Orders:  Orders Placed This Encounter  Procedures  . XR Ankle Complete Left   No orders of the defined types were placed in this encounter.    Procedures: No procedures performed  Clinical Data: No additional findings.  ROS:  All other systems negative, except as noted in the HPI. Review of Systems  Objective: Vital Signs: Ht 5\' 8"  (1.727 m)   Wt 173 lb (78.5 kg)   BMI 26.30 kg/m   Specialty Comments:  No specialty comments available.  PMFS History: Patient Active Problem List   Diagnosis Date Noted  . Asymptomatic carotid artery stenosis 11/30/2018  . Closed trimalleolar fracture of ankle, left, initial encounter 11/15/2018  . Anxiety 04/27/2017  . Rectocele, female 09/12/2014  . Cystocele with prolapse 09/12/2014  . Prediabetes 09/12/2014  . Hyperlipidemia 09/12/2014  . Elevated  blood pressure reading without diagnosis of hypertension 09/12/2014  . Sjogren's disease ([place])   . VSD (ventricular septal defect), perimembranous   . Osteopenia   . Vitamin D deficiency   . Migraines    Past Medical History:  Diagnosis Date  . Anemia   . Anxiety    situational  . Arthritis   . Complication of anesthesia   . Depression   . Family history of adverse reaction to anesthesia    Mother - nausea, daughter nausea  . GERD (gastroesophageal reflux disease)   . Heart murmur   . Migraines   . Osteopenia   . PONV (postoperative nausea and vomiting) 1976, 2001   Nausea   . Sjogren's disease ([place])   . Unspecified vitamin D deficiency   . VSD  (ventricular septal defect), perimembranous     Family History  Problem Relation Age of Onset  . Hypertension Mother   . Stroke Mother   . Hypertension Father   . Stroke Father   . Bladder Cancer Father   . Breast cancer Cousin   . Ovarian cancer Maternal Aunt   . Uterine cancer Maternal Grandmother   . ALS Other   . Multiple sclerosis Cousin   . Parkinson's disease Cousin     Past Surgical History:  Procedure Laterality Date  . ABLATION ON ENDOMETRIOSIS    . cervical conization  1976  . COLONOSCOPY  2012  . DILATION AND CURETTAGE OF UTERUS     x 2  . ESOPHAGOGASTRODUODENOSCOPY  01/28/13   Dr Michail Sermon- dysmotility  . lip biopsy     + sjogrens  . ORIF ANKLE FRACTURE Left 11/17/2018   Procedure: OPEN REDUCTION INTERNAL FIXATION (ORIF) LEFT ANKLE FRACTURE;  Surgeon: Newt Minion, MD;  Location: [facility];  Service: Orthopedics;  Laterality: Left;   Social History   Occupational History  . Not on file  Tobacco Use  . Smoking status: Never Smoker  . Smokeless tobacco: Never Used  Substance and Sexual Activity  . Alcohol use: Yes    Comment: rare  . Drug use: No  . Sexual activity: Not on file

## 2018-12-08 ENCOUNTER — Telehealth: Payer: Self-pay | Admitting: Orthopedic Surgery

## 2018-12-08 MED ORDER — OXYCODONE-ACETAMINOPHEN 5-325 MG PO TABS: 1.0000 | ORAL_TABLET | Freq: Three times a day (TID) | ORAL | 0 refills | Status: DC | PRN

## 2018-12-08 NOTE — Telephone Encounter (Signed)
Patient called needing Rx refilled (Oxycodone) The number to contact patient is [phone] °

## 2018-12-08 NOTE — Telephone Encounter (Signed)
11/17/18 ORIF ankle fx. Pt has had Oxycodone 5/325 #30 on 7/21, 7/15, 7/8 and #20 on 11/11/18 please advise.

## 2018-12-12 IMAGING — MR MR MRA HEAD W/O CM
1 series · 23 of 48 positions shown · non-contrast
Comparison: None.

CLINICAL DATA: Nonruptured cerebral aneurysm. Subarachnoid
hemorrhage vasospasm

EXAM:
MRA HEAD WITHOUT CONTRAST
TECHNIQUE: Angiographic images of the Circle of Willis were obtained using MRA
technique without intravenous contrast.

[Series 3: tof_3d_multi-slab new · axial · 0.7mm · 0.35mm/px · z∈[-2,+92]mm · 23 of 143 slices shown]
[im 1/143]
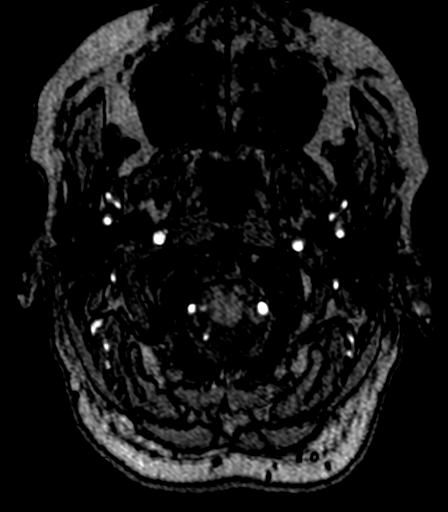
[im 4/143]
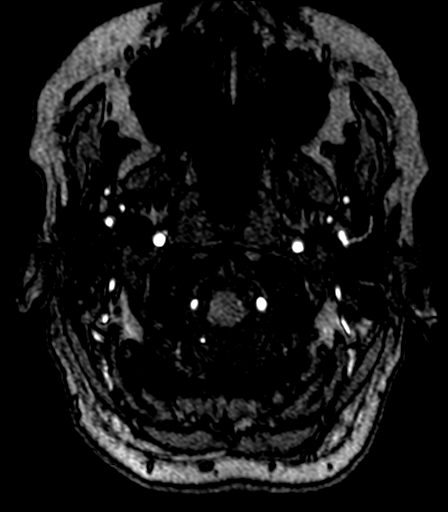
[im 7/143]
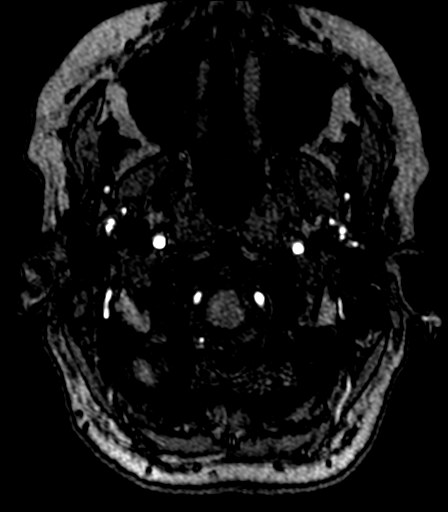
[im 10/143]
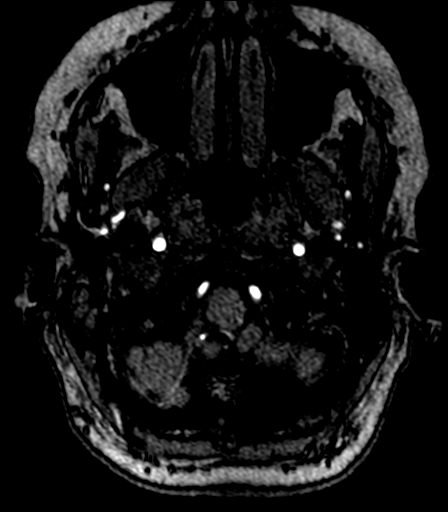
[im 13/143]
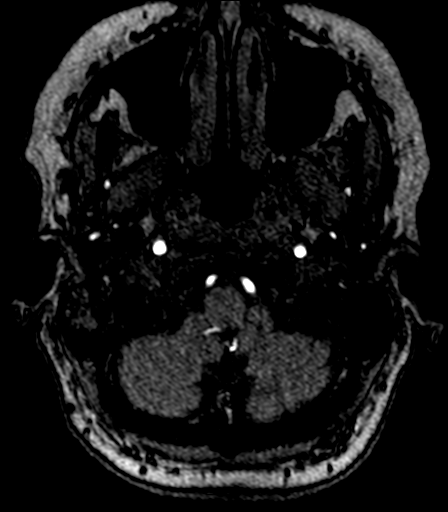
[im 16/143]
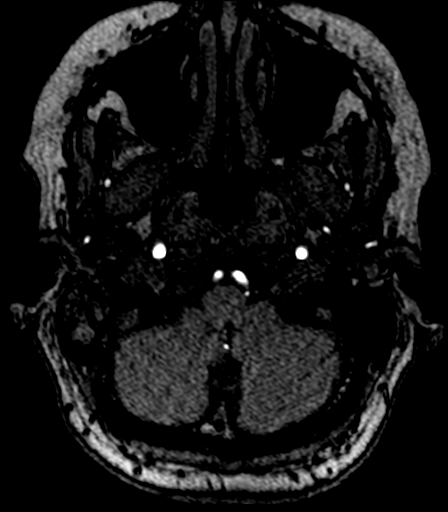
[im 19/143]
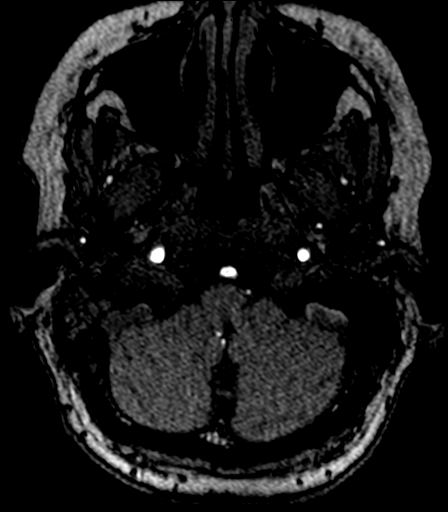
[im 22/143]
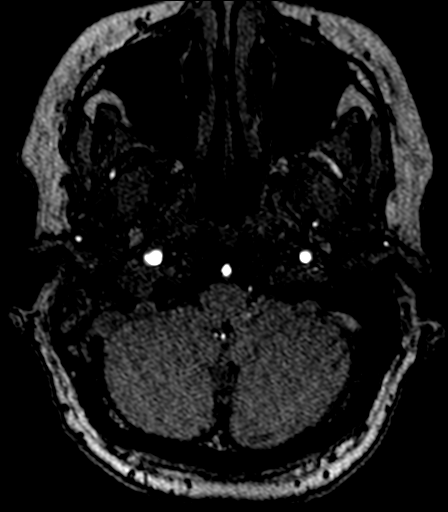
[im 25/143]
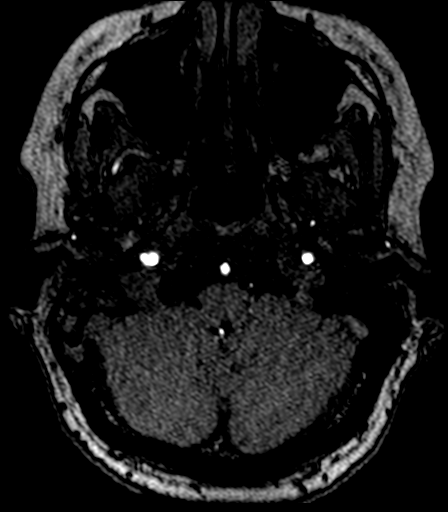
[im 28/143]
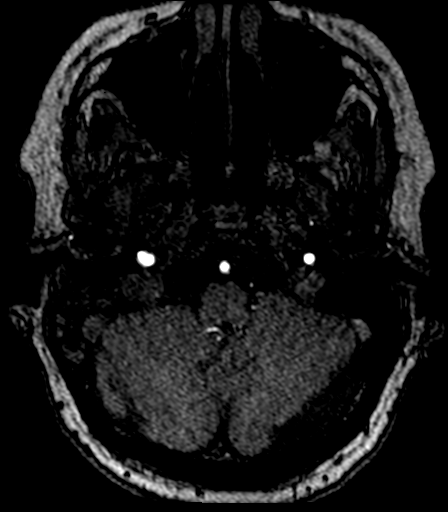
[im 31/143]
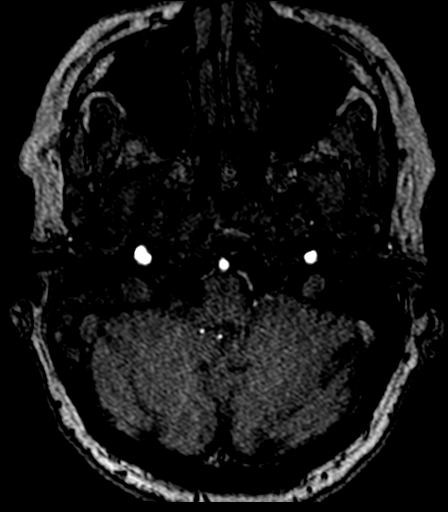
[im 34/143]
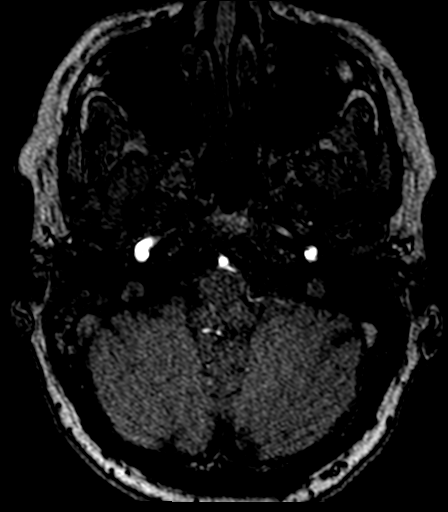
[im 37/143]
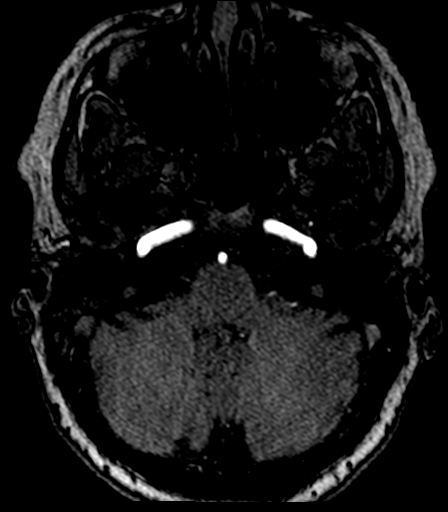
[im 40/143]
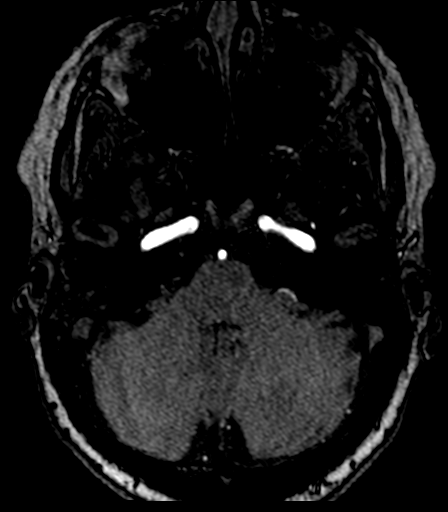
[im 43/143]
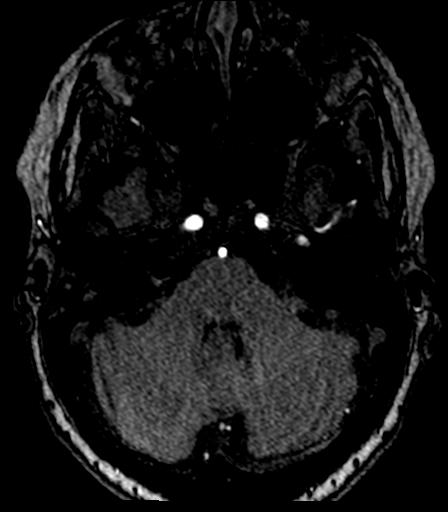
[im 46/143]
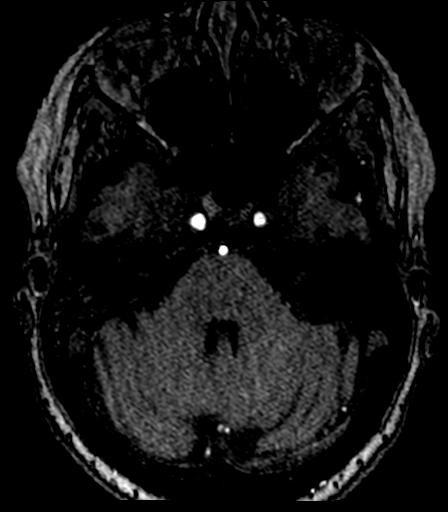
[im 64/143]
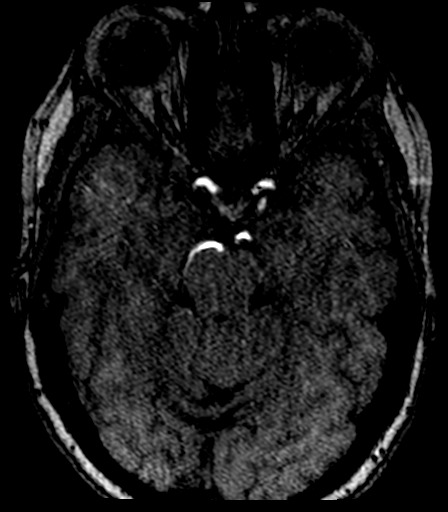
[im 73/143]
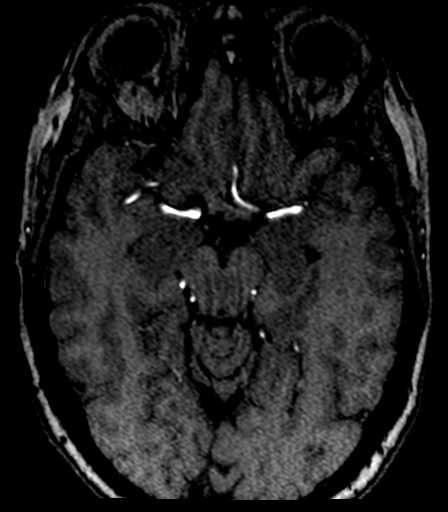
[im 82/143]
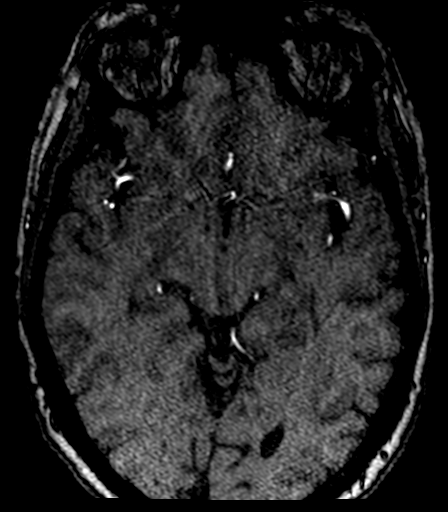
[im 100/143]
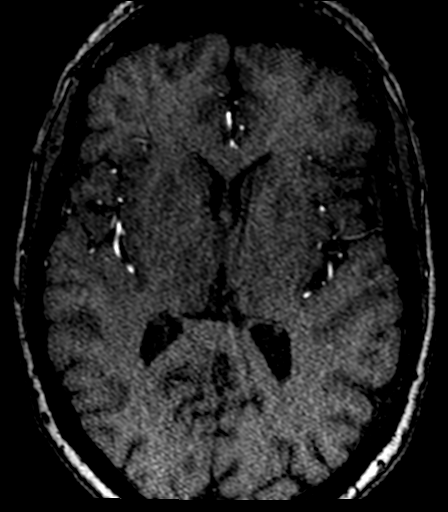
[im 118/143]
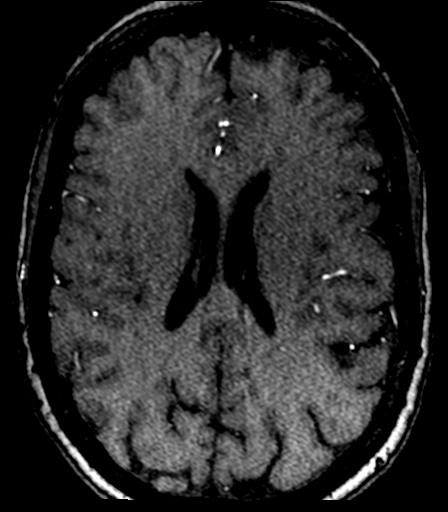
[im 121/143]
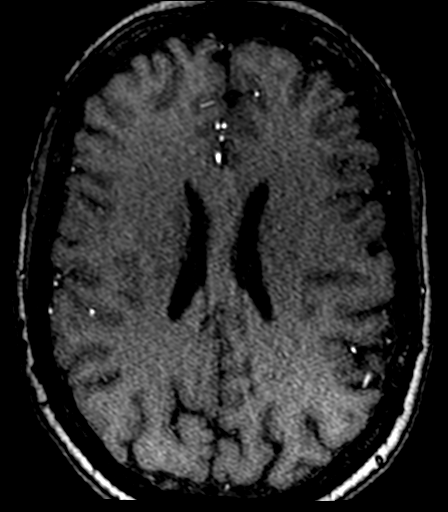
[im 136/143]
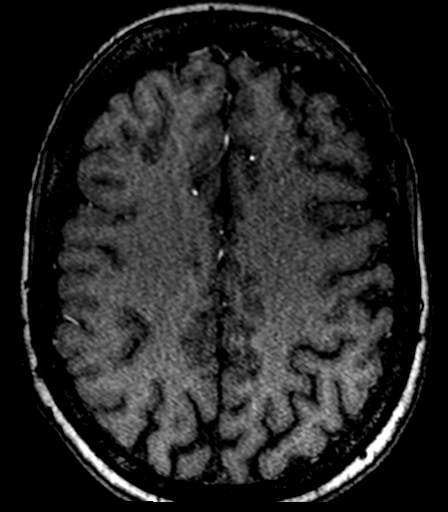

[23 of 48 positions shown; findings below may reference images not displayed]

FINDINGS: Both vertebral arteries are patent to the basilar without stenosis.
PICA patent bilaterally, dominant on the right. Basilar widely
patent. Bilateral AICA, superior cerebellar, and posterior cerebral
arteries patent without stenosis

Mild stenosis left cavernous carotid. Right carotid patent. Anterior
and middle cerebral arteries patent bilaterally without significant
stenosis. No large vessel occlusion.

Negative for cerebral aneurysm.
IMPRESSION: Negative for cerebral aneurysm.

Mild stenosis left cavernous carotid.

## 2018-12-13 ENCOUNTER — Other Ambulatory Visit: Payer: Self-pay

## 2018-12-13 ENCOUNTER — Ambulatory Visit (INDEPENDENT_AMBULATORY_CARE_PROVIDER_SITE_OTHER): Payer: BC Managed Care – PPO | Admitting: Orthopedic Surgery

## 2018-12-13 ENCOUNTER — Encounter: Payer: Self-pay | Admitting: Orthopedic Surgery

## 2018-12-13 VITALS — Ht 68.0 in | Wt 173.0 lb

## 2018-12-13 DIAGNOSIS — S82852D Displaced trimalleolar fracture of left lower leg, subsequent encounter for closed fracture with routine healing: Secondary | ICD-10-CM

## 2018-12-14 ENCOUNTER — Telehealth: Payer: Self-pay

## 2018-12-14 ENCOUNTER — Encounter: Payer: Self-pay | Admitting: Orthopedic Surgery

## 2018-12-14 NOTE — Telephone Encounter (Signed)
Prior auth completed through cover my meds for Oxycodone 5/325 12/10/18 this has been approved from 11/09/18 through 12/10/19

## 2018-12-14 NOTE — Progress Notes (Signed)
Office Visit Note   Patient: Bianca Fernandez           Date of Birth: [birth year]           MRN: [id number] Visit Date: 12/13/2018              Requested by: Unk Pinto, [place],  [place] [address] PCP: Unk Pinto, MD  Chief Complaint  Patient presents with  . Left Ankle - Routine Post Op    11/17/2018 ORIF left ankle fx      HPI: Patient is a 63 year old woman who presents 4 weeks status post open reduction internal fixation left ankle.  She is currently in a fracture boot and compression sock she states she has tingling into her toes swelling she states the pain is worse in the evening which she describes as an 8 out of 10 with increased swelling at the end of the day.  Assessment & Plan: Visit Diagnoses:  1. Closed trimalleolar fracture of left ankle with routine healing, subsequent encounter     Plan: Recommend continue with compression sock elevation recommended ankle range of motion to work the calf pump.  Follow-up in 4 weeks with repeat radiographs.  Anticipate full weightbearing at follow-up.  Follow-Up Instructions: No follow-ups on file.   Ortho Exam  Patient is alert, oriented, no adenopathy, well-dressed, normal affect, normal respiratory effort. Examination patient does have some swelling no redness no cellulitis no signs of infection.  She has dorsiflexion to neutral recommended Achilles stretching she may be weightbearing as tolerated in her fracture boot.  Imaging: No results found. No images are attached to the encounter.  Labs: Lab Results  Component Value Date   HGBA1C 5.6 06/03/2018   HGBA1C 5.5 05/26/2016   HGBA1C 5.9 (H) 05/24/2015   LABORGA Multiple bacterial morphotypes present, none 05/17/2014   LABORGA predominant. Suggest appropriate recollection if  05/17/2014   LABORGA clinically indicated. 05/17/2014     Lab Results  Component Value Date   ALBUMIN 4.6 05/26/2016   ALBUMIN 4.6 05/24/2015   ALBUMIN 4.5 09/12/2014    Lab Results  Component Value Date   MG 2.0 06/03/2018   MG 1.9 05/26/2017   MG 1.9 05/24/2015   Lab Results  Component Value Date   VD25OH 38 06/03/2018   VD25OH 35 05/26/2017   VD25OH 40 05/24/2015    No results found for: PREALBUMIN CBC EXTENDED Latest Ref Rng & Units 11/17/2018 08/02/2018 06/03/2018  WBC 4.0 - 10.5 K/uL 9.7 6.0 8.0  RBC 3.87 - 5.11 MIL/uL 4.05 4.12 4.45  HGB 12.0 - 15.0 g/dL 12.2 12.3 13.2  HCT 36.0 - 46.0 % 35.8(L) 36.4 39.2  PLT 150 - 400 K/uL 350 285 342  NEUTROABS 1,500 - 7,800 cells/uL - 3,564 5,576  LYMPHSABS 850 - 3,900 cells/uL - 1,560 1,696     Body mass index is 26.3 kg/m.  Orders:  No orders of the defined types were placed in this encounter.  No orders of the defined types were placed in this encounter.    Procedures: No procedures performed  Clinical Data: No additional findings.  ROS:  All other systems negative, except as noted in the HPI. Review of Systems  Objective: Vital Signs: Ht 5\' 8"  (1.727 m)   Wt 173 lb (78.5 kg)   BMI 26.30 kg/m   Specialty Comments:  No specialty comments available.  PMFS History: Patient Active Problem List   Diagnosis Date Noted  . Asymptomatic carotid artery  stenosis 11/30/2018  . Closed trimalleolar fracture of ankle, left, initial encounter 11/15/2018  . Anxiety 04/27/2017  . Rectocele, female 09/12/2014  . Cystocele with prolapse 09/12/2014  . Prediabetes 09/12/2014  . Hyperlipidemia 09/12/2014  . Elevated blood pressure reading without diagnosis of hypertension 09/12/2014  . Sjogren's disease ([facility])   . VSD (ventricular septal defect), perimembranous   . Osteopenia   . Vitamin D deficiency   . Migraines    Past Medical History:  Diagnosis Date  . Anemia   . Anxiety    situational  . Arthritis   . Complication of anesthesia   . Depression   . Family history of adverse reaction to anesthesia    Mother - nausea, daughter nausea  . GERD  (gastroesophageal reflux disease)   . Heart murmur   . Migraines   . Osteopenia   . PONV (postoperative nausea and vomiting) 1976, 2001   Nausea   . Sjogren's disease ([facility])   . Unspecified vitamin D deficiency   . VSD (ventricular septal defect), perimembranous     Family History  Problem Relation Age of Onset  . Hypertension Mother   . Stroke Mother   . Hypertension Father   . Stroke Father   . Bladder Cancer Father   . Breast cancer Cousin   . Ovarian cancer Maternal Aunt   . Uterine cancer Maternal Grandmother   . ALS Other   . Multiple sclerosis Cousin   . Parkinson's disease Cousin     Past Surgical History:  Procedure Laterality Date  . ABLATION ON ENDOMETRIOSIS    . cervical conization  1976  . COLONOSCOPY  2012  . DILATION AND CURETTAGE OF UTERUS     x 2  . ESOPHAGOGASTRODUODENOSCOPY  01/28/13   Dr Bosie ClosSchooler- dysmotility  . lip biopsy     + sjogrens  . ORIF ANKLE FRACTURE Left 11/17/2018   Procedure: OPEN REDUCTION INTERNAL FIXATION (ORIF) LEFT ANKLE FRACTURE;  Surgeon: Nadara Mustarduda, Marcus V, MD;  [facility];  Service: Orthopedics;  Laterality: Left;   Social History   Occupational History  . Not on file  Tobacco Use  . Smoking status: Never Smoker  . Smokeless tobacco: Never Used  Substance and Sexual Activity  . Alcohol use: Yes    Comment: rare  . Drug use: No  . Sexual activity: Not on file

## 2018-12-21 ENCOUNTER — Telehealth: Payer: Self-pay | Admitting: Orthopedic Surgery

## 2018-12-21 MED ORDER — OXYCODONE-ACETAMINOPHEN 5-325 MG PO TABS: 1.0000 | ORAL_TABLET | Freq: Two times a day (BID) | ORAL | 0 refills | Status: DC | PRN

## 2018-12-21 NOTE — Telephone Encounter (Signed)
Patient called needing Rx refilled (Oxycodone) The number to contact patient is [phone]

## 2018-12-21 NOTE — Telephone Encounter (Signed)
sent 

## 2018-12-21 NOTE — Telephone Encounter (Signed)
Erin please advise, thank you.  

## 2018-12-21 NOTE — Telephone Encounter (Signed)
Patient was sent Rx and informed.

## 2018-12-22 ENCOUNTER — Other Ambulatory Visit: Payer: Self-pay

## 2018-12-22 ENCOUNTER — Encounter: Payer: Self-pay | Admitting: Adult Health

## 2018-12-22 ENCOUNTER — Ambulatory Visit (INDEPENDENT_AMBULATORY_CARE_PROVIDER_SITE_OTHER): Payer: BC Managed Care – PPO | Admitting: Adult Health

## 2018-12-22 VITALS — BP 142/80 | HR 116 | Temp 97.5°F

## 2018-12-22 DIAGNOSIS — G4700 Insomnia, unspecified: Secondary | ICD-10-CM | POA: Diagnosis not present

## 2018-12-22 DIAGNOSIS — R3915 Urgency of urination: Secondary | ICD-10-CM | POA: Diagnosis not present

## 2018-12-22 MED ORDER — GABAPENTIN 100 MG PO CAPS: 100.0000 mg | ORAL_CAPSULE | Freq: Every day | ORAL | 0 refills | Status: DC

## 2018-12-22 NOTE — Patient Instructions (Signed)
Try OTC azo - can take 1-2 tabs every 8 hours as needed for bladder pain/dyscomfort   If urine test is negative - try taking myrbetriq 50 mg once daily for two weeks - call me and let me know if this is helpful  Try gabapentin 1-3 caps at night for sleep - call me and let me know if this seems to be helping and what dose is working. Can try low doses during the day if seems to help with pain management.   Urinary Frequency, Adult Urinary frequency means urinating more often than usual. You may urinate every 1-2 hours even though you drink a normal amount of fluid and do not have a bladder infection or condition. Although you urinate more often than normal, the total amount of urine produced in a day is normal. With urinary frequency, you may have an urgent need to urinate often. The stress and anxiety of needing to find a bathroom quickly can make this urge worse. This condition may go away on its own or you may need treatment at home. Home treatment may include bladder training, exercises, taking medicines, or making changes to your diet. Follow these instructions at home: Bladder health   Keep a bladder diary if told by your health care provider. Keep track of: ? What you eat and drink. ? How often you urinate. ? How much you urinate.  Follow a bladder training program if told by your health care provider. This may include: ? Learning to delay going to the bathroom. ? Double urinating (voiding). This helps if you are not completely emptying your bladder. ? Scheduled voiding.  Do Kegel exercises as told by your health care provider. Kegel exercises strengthen the muscles that help control urination, which may help the condition. Eating and drinking  If told by your health care provider, make diet changes, such as: ? Avoiding caffeine. ? Drinking fewer fluids, especially alcohol. ? Not drinking in the evening. ? Avoiding foods or drinks that may irritate the bladder. These include  coffee, tea, soda, artificial sweeteners, citrus, tomato-based foods, and chocolate. ? Eating foods that help prevent or ease constipation. Constipation can make this condition worse. Your health care provider may recommend that you:  Drink enough fluid to keep your urine pale yellow.  Take over-the-counter or prescription medicines.  Eat foods that are high in fiber, such as beans, whole grains, and fresh fruits and vegetables.  Limit foods that are high in fat and processed sugars, such as fried or sweet foods. General instructions  Take over-the-counter and prescription medicines only as told by your health care provider.  Keep all follow-up visits as told by your health care provider. This is important. Contact a health care provider if:  You start urinating more often.  You feel pain or irritation when you urinate.  You notice blood in your urine.  Your urine looks cloudy.  You develop a fever.  You begin vomiting. Get help right away if:  You are unable to urinate. Summary  Urinary frequency means urinating more often than usual. With urinary frequency, you may urinate every 1-2 hours even though you drink a normal amount of fluid and do not have a bladder infection or other bladder condition.  Your health care provider may recommend that you keep a bladder diary, follow a bladder training program, or make dietary changes.  If told by your health care provider, do Kegel exercises to strengthen the muscles that help control urination.  Take over-the-counter and  prescription medicines only as told by your health care provider.  Contact a health care provider if your symptoms do not improve or get worse. This information is not intended to replace advice given to you by your health care provider. Make sure you discuss any questions you have with your health care provider. Document Released: 02/22/2009 Document Revised: 11/05/2017 Document Reviewed: 11/05/2017 Elsevier  Patient Education  2020 Elsevier Inc.    Gabapentin capsules or tablets What is this medicine? GABAPENTIN (GA ba pen tin) is used to control seizures in certain types of epilepsy. It is also used to treat certain types of nerve pain. This medicine may be used for other purposes; ask your health care provider or pharmacist if you have questions. COMMON BRAND NAME(S): Active-PAC with Gabapentin, Gabarone, Neurontin What should I tell my health care provider before I take this medicine? They need to know if you have any of these conditions:  history of drug abuse or alcohol abuse problem  kidney disease  lung or breathing disease  suicidal thoughts, plans, or attempt; a previous suicide attempt by you or a family member  an unusual or allergic reaction to gabapentin, other medicines, foods, dyes, or preservatives  pregnant or trying to get pregnant  breast-feeding How should I use this medicine? Take this medicine by mouth with a glass of water. Follow the directions on the prescription label. You can take it with or without food. If it upsets your stomach, take it with food. Take your medicine at regular intervals. Do not take it more often than directed. Do not stop taking except on your doctor's advice. If you are directed to break the 600 or 800 mg tablets in half as part of your dose, the extra half tablet should be used for the next dose. If you have not used the extra half tablet within 28 days, it should be thrown away. A special MedGuide will be given to you by the pharmacist with each prescription and refill. Be sure to read this information carefully each time. Talk to your pediatrician regarding the use of this medicine in children. While this drug may be prescribed for children as young as 3 years for selected conditions, precautions do apply. Overdosage: If you think you have taken too much of this medicine contact a poison control center or emergency room at once. NOTE:  This medicine is only for you. Do not share this medicine with others. What if I miss a dose? If you miss a dose, take it as soon as you can. If it is almost time for your next dose, take only that dose. Do not take double or extra doses. What may interact with this medicine? This medicine may interact with the following medications:  alcohol  antihistamines for allergy, cough, and cold  certain medicines for anxiety or sleep  certain medicines for depression like amitriptyline, fluoxetine, sertraline  certain medicines for seizures like phenobarbital, primidone  certain medicines for stomach problems  general anesthetics like halothane, isoflurane, methoxyflurane, propofol  local anesthetics like lidocaine, pramoxine, tetracaine  medicines that relax muscles for surgery  narcotic medicines for pain  phenothiazines like chlorpromazine, mesoridazine, prochlorperazine, thioridazine This list may not describe all possible interactions. Give your health care provider a list of all the medicines, herbs, non-prescription drugs, or dietary supplements you use. Also tell them if you smoke, drink alcohol, or use illegal drugs. Some items may interact with your medicine. What should I watch for while using this medicine? Visit your  doctor or health care provider for regular checks on your progress. You may want to keep a record at home of how you feel your condition is responding to treatment. You may want to share this information with your doctor or health care provider at each visit. You should contact your doctor or health care provider if your seizures get worse or if you have any new types of seizures. Do not stop taking this medicine or any of your seizure medicines unless instructed by your doctor or health care provider. Stopping your medicine suddenly can increase your seizures or their severity. This medicine may cause serious skin reactions. They can happen weeks to months after  starting the medicine. Contact your health care provider right away if you notice fevers or flu-like symptoms with a rash. The rash may be red or purple and then turn into blisters or peeling of the skin. Or, you might notice a red rash with swelling of the face, lips or lymph nodes in your neck or under your arms. Wear a medical identification bracelet or chain if you are taking this medicine for seizures, and carry a card that lists all your medications. You may get drowsy, dizzy, or have blurred vision. Do not drive, use machinery, or do anything that needs mental alertness until you know how this medicine affects you. To reduce dizzy or fainting spells, do not sit or stand up quickly, especially if you are an older patient. Alcohol can increase drowsiness and dizziness. Avoid alcoholic drinks. Your mouth may get dry. Chewing sugarless gum or sucking hard candy, and drinking plenty of water will help. The use of this medicine may increase the chance of suicidal thoughts or actions. Pay special attention to how you are responding while on this medicine. Any worsening of mood, or thoughts of suicide or dying should be reported to your health care provider right away. Women who become pregnant while using this medicine may enroll in the [place] Antiepileptic Drug Pregnancy Registry by calling [phone]. This registry collects information about the safety of antiepileptic drug use during pregnancy. What side effects may I notice from receiving this medicine? Side effects that you should report to your doctor or health care professional as soon as possible:  allergic reactions like skin rash, itching or hives, swelling of the face, lips, or tongue  breathing problems  rash, fever, and swollen lymph nodes  redness, blistering, peeling or loosening of the skin, including inside the mouth  suicidal thoughts, mood changes Side effects that usually do not require medical attention (report to  your doctor or health care professional if they continue or are bothersome):  dizziness  drowsiness  headache  nausea, vomiting  swelling of ankles, feet, hands  tiredness This list may not describe all possible side effects. Call your doctor for medical advice about side effects. You may report side effects to FDA at 1-800-FDA-1088. Where should I keep my medicine? Keep out of reach of children. This medicine may cause accidental overdose and death if it taken by other adults, children, or pets. Mix any unused medicine with a substance like cat litter or coffee grounds. Then throw the medicine away in a sealed container like a sealed bag or a coffee can with a lid. Do not use the medicine after the expiration date. Store at room temperature between 15 and 30 degrees C (59 and 86 degrees F). NOTE: This sheet is a summary. It may not cover all possible information. If you have questions  about this medicine, talk to your doctor, pharmacist, or health care provider.  2020 Elsevier/Gold Standard (2018-07-30 14:16:43)

## 2018-12-22 NOTE — Progress Notes (Signed)
Assessment and Plan:  Bianca Fernandez was seen today for urinary urgency.  Diagnoses and all orders for this visit:  Urinary urgency/frequency Possible UTI vs spastic bladder, possible progression of bladder prolapse Pending UA abx if suggestive if infectious process Continue to push water intake Given sample of myrbetriq 50 mg x 2 weeks to trial if not UTI She will call back to report if this is beneficial If persistent will encourage her to follow up with GYN -     Urinalysis w microscopic + reflex cultur  Insomnia, unspecified type good sleep hygiene discussed Try gabapentin as prescribed below; if seems helpful with pain as well, call back and can prescribe to take tolerated dose during the day as well -     gabapentin (NEURONTIN) 100 MG capsule; Take 1-3 capsules (100-300 mg total) by mouth at bedtime. As needed for sleep.  Further disposition pending results of labs. Discussed med's effects and SE's.   Over 30 minutes of exam, counseling, chart review, and critical decision making was performed.   Future Appointments  Date Time Provider [place]  12/29/2018  2:00 PM [facility] 4 [facility]  01/10/2019 10:45 AM Newt Minion, MD [facility] None  06/08/2019 10:00 AM Vicie Mutters, PA-C [facility] None    ------------------------------------------------------------------------------------------------------------------   HPI BP (!) 142/80   Pulse (!) 116   Temp (!) 97.5 F (36.4 C)   SpO2 97%   63 y.o.female with hx of rectocele and bladder prolapse (Dr. Nori Riis has suggested referral for surgery but deferring at this time) presents for evaluation of possible UTI.   She reports at baseline has frequency, mild burning, pressure when standing. She reports over the last 3 days ago having tingling/discomfort of urethral area; denies changes in urine character, odor, normal color without cloudiness. Denies vaginal discharge. Married, not recently sexually active. She doesn't  particularly endorse dysuria. She does endorse some hesitancy. She endorses urgency worse than usual.   She also reports having lots of trouble sleeping, also with foot pain due to recent bimalleolar fracture repair. Doesn't tolerate benadryl.    Past Medical History:  Diagnosis Date  . Anemia   . Anxiety    situational  . Arthritis   . Complication of anesthesia   . Depression   . Family history of adverse reaction to anesthesia    Mother - nausea, daughter nausea  . GERD (gastroesophageal reflux disease)   . Heart murmur   . Migraines   . Osteopenia   . PONV (postoperative nausea and vomiting) 1976, 2001   Nausea   . Sjogren's disease ([place])   . Unspecified vitamin D deficiency   . VSD (ventricular septal defect), perimembranous      Allergies  Allergen Reactions  . Estradiol     Patch caused itching     Current Outpatient Medications on File Prior to Visit  Medication Sig  . acetaminophen (TYLENOL) 500 MG tablet Take 1,000 mg by mouth every 6 (six) hours as needed for moderate pain or headache.  . B Complex-C (SUPER B COMPLEX PO) Take 1 Dose by mouth 2 (two) times daily.   . calcium carbonate (TUMS - DOSED IN MG ELEMENTAL CALCIUM) 500 MG chewable tablet Chew 2 tablets by mouth 2 (two) times daily as needed for indigestion or heartburn.  . Calcium Carbonate-Vitamin D (CALTRATE 600+D PO) Take 1 tablet by mouth 2 (two) times daily. 630 mg calcium and 500 IU Vitamin D  . Carboxymethylcellul-Glycerin (LUBRICATING EYE DROPS OP) Place 1 drop  into both eyes daily as needed (dry eyes).  . Cholecalciferol (VITAMIN D) 2000 UNITS tablet Take 2,000 Units by mouth daily.  Marland Kitchen. ibuprofen (ADVIL) 800 MG tablet Take 800 mg by mouth every 6 (six) hours as needed for moderate pain.  . Magnesium 400 MG TABS Take 400 mg by mouth daily.   . Omega 3-6-9 Fatty Acids (OMEGA 3-6-9 COMPLEX PO) Take 1 tablet by mouth daily with lunch.   . oxyCODONE-acetaminophen (PERCOCET/ROXICET) 5-325 MG tablet Take  1 tablet by mouth 2 (two) times daily as needed.   No current facility-administered medications on file prior to visit.     ROS: all negative except above.   Physical Exam:  BP (!) 142/80   Pulse (!) 116   Temp (!) 97.5 F (36.4 C)   SpO2 97%   General Appearance: Well nourished, in no apparent distress. Eyes: conjunctiva no swelling or erythema ENT/Mouth: Hearing normal.  Neck: Supple, thyroid normal.  Respiratory: Respiratory effort normal, BS equal bilaterally without rales, rhonchi, wheezing or stridor.  Cardio: RRR with no MRGs. Brisk peripheral pulse of RLE without edema. LLE in walking boot. Abdomen: Soft, + BS.  Non tender, no guarding, rebound, hernias, masses. Lymphatics: Non tender without lymphadenopathy.  Musculoskeletal: LLE in walking boot, gait not assessed, departed in wheelchair Skin: Warm, dry without rashes, lesions, ecchymosis.  Neuro: Normal muscle tone, Sensation intact.  Psych: Awake and oriented X 3, normal affect, Insight and Judgment appropriate.     Dan MakerAshley C Livian Vanderbeck, NP 3:58 PM [facility]

## 2018-12-23 ENCOUNTER — Other Ambulatory Visit: Payer: Self-pay | Admitting: Adult Health

## 2018-12-23 MED ORDER — NYSTATIN 100000 UNIT/GM EX OINT: 1.0000 "application " | TOPICAL_OINTMENT | Freq: Two times a day (BID) | CUTANEOUS | 0 refills | Status: DC

## 2018-12-23 MED ORDER — TRIAMCINOLONE ACETONIDE 0.1 % EX OINT: 1.0000 "application " | TOPICAL_OINTMENT | Freq: Two times a day (BID) | CUTANEOUS | 1 refills | Status: DC

## 2018-12-24 LAB — URINALYSIS W MICROSCOPIC + REFLEX CULTURE
Bacteria, UA: NONE SEEN /HPF
Bilirubin Urine: NEGATIVE
Glucose, UA: NEGATIVE
Hgb urine dipstick: NEGATIVE
Hyaline Cast: NONE SEEN /LPF
Nitrites, Initial: NEGATIVE
Protein, ur: NEGATIVE
RBC / HPF: NONE SEEN /HPF (ref 0–2)
Specific Gravity, Urine: 1.012 (ref 1.001–1.03)
Squamous Epithelial / HPF: NONE SEEN /HPF (ref <=5)
WBC, UA: NONE SEEN /HPF (ref 0–5)
pH: 7 (ref 5.0–8.0)

## 2018-12-24 LAB — URINE CULTURE
MICRO NUMBER:: 768107
SPECIMEN QUALITY:: ADEQUATE

## 2018-12-24 LAB — CULTURE INDICATED

## 2018-12-27 ENCOUNTER — Ambulatory Visit (INDEPENDENT_AMBULATORY_CARE_PROVIDER_SITE_OTHER): Payer: BC Managed Care – PPO

## 2018-12-27 ENCOUNTER — Ambulatory Visit (INDEPENDENT_AMBULATORY_CARE_PROVIDER_SITE_OTHER): Payer: BC Managed Care – PPO | Admitting: Orthopedic Surgery

## 2018-12-27 DIAGNOSIS — S82852D Displaced trimalleolar fracture of left lower leg, subsequent encounter for closed fracture with routine healing: Secondary | ICD-10-CM

## 2018-12-27 MED ORDER — DOXYCYCLINE HYCLATE 100 MG PO TABS: 100.0000 mg | ORAL_TABLET | Freq: Two times a day (BID) | ORAL | 0 refills | Status: DC

## 2018-12-27 NOTE — Progress Notes (Signed)
Office Visit Note   Patient: Bianca Fernandez           Date of Birth: Sep 23, 1955           MRN: [id number] Visit Date: 12/27/2018              Requested by: Unk Pinto, [address],  [place] [address] PCP: Unk Pinto, MD  Chief Complaint  Patient presents with  . Left Ankle - Routine Post Op      HPI: Patient is a 63 year old woman who is about 5 weeks status post open reduction internal fixation trimalleolar left ankle fracture.  Patient states she has had a few areas that she has had some purulent drainage from the lateral incision she states her pain is a 7/10 she states she has swelling and redness.  She is currently weightbearing with a walker.  Assessment & Plan: Visit Diagnoses:  1. Closed trimalleolar fracture of left ankle with routine healing, subsequent encounter     Plan: Patient will continue with compression sock continue with elevation continue with range of motion of the ankle.  We will start on a prescription for doxycycline reevaluate in 1 week.  These appear to be sterile stitch abscesses that are causing the drainage and with elevation all of the redness resolves.  Follow-Up Instructions: Return in about 1 week (around 01/03/2019).   Ortho Exam  Patient is alert, oriented, no adenopathy, well-dressed, normal affect, normal respiratory effort. Examination patient has 4 areas of skin breakdown over the incision with healthy granulation tissue at the base.  With palpation there is no deep abscess or drainage.  Patient has dependent redness with elevation this completely resolved.  There is no ascending cellulitis clinically this appears to be more of a sterile stitch abscess that a deep abscess.  There is no exposed bone hardware or tendon.  Imaging: Xr Ankle Complete Left  Result Date: 12/28/2018 3 view radiographs of the left ankle shows the fibula is out to length the mortise is congruent no hardware failure no  osteochondral defects.  No images are attached to the encounter.  Labs: Lab Results  Component Value Date   HGBA1C 5.6 06/03/2018   HGBA1C 5.5 05/26/2016   HGBA1C 5.9 (H) 05/24/2015   LABORGA Multiple bacterial morphotypes present, none 05/17/2014   LABORGA predominant. Suggest appropriate recollection if  05/17/2014   LABORGA clinically indicated. 05/17/2014     Lab Results  Component Value Date   ALBUMIN 4.6 05/26/2016   ALBUMIN 4.6 05/24/2015   ALBUMIN 4.5 09/12/2014    Lab Results  Component Value Date   MG 2.0 06/03/2018   MG 1.9 05/26/2017   MG 1.9 05/24/2015   Lab Results  Component Value Date   VD25OH 38 06/03/2018   VD25OH 35 05/26/2017   VD25OH 40 05/24/2015    No results found for: PREALBUMIN CBC EXTENDED Latest Ref Rng & Units 11/17/2018 08/02/2018 06/03/2018  WBC 4.0 - 10.5 K/uL 9.7 6.0 8.0  RBC 3.87 - 5.11 MIL/uL 4.05 4.12 4.45  HGB 12.0 - 15.0 g/dL 12.2 12.3 13.2  HCT 36.0 - 46.0 % 35.8(L) 36.4 39.2  PLT 150 - 400 K/uL 350 285 342  NEUTROABS 1,500 - 7,800 cells/uL - 3,564 5,576  LYMPHSABS 850 - 3,900 cells/uL - 1,560 1,696     There is no height or weight on file to calculate BMI.  Orders:  Orders Placed This Encounter  Procedures  . XR Ankle Complete Left  Meds ordered this encounter  Medications  . doxycycline (VIBRA-TABS) 100 MG tablet    Sig: Take 1 tablet (100 mg total) by mouth 2 (two) times daily.    Dispense:  20 tablet    Refill:  0     Procedures: No procedures performed  Clinical Data: No additional findings.  ROS:  All other systems negative, except as noted in the HPI. Review of Systems  Objective: Vital Signs: There were no vitals taken for this visit.  Specialty Comments:  No specialty comments available.  PMFS History: Patient Active Problem List   Diagnosis Date Noted  . Asymptomatic carotid artery stenosis 11/30/2018  . Closed trimalleolar fracture of ankle, left, initial encounter 11/15/2018  . Anxiety  04/27/2017  . Rectocele, female 09/12/2014  . Cystocele with prolapse 09/12/2014  . Prediabetes 09/12/2014  . Hyperlipidemia 09/12/2014  . Elevated blood pressure reading without diagnosis of hypertension 09/12/2014  . Sjogren's disease ([facility])   . VSD (ventricular septal defect), perimembranous   . Osteopenia   . Vitamin D deficiency   . Migraines    Past Medical History:  Diagnosis Date  . Anemia   . Anxiety    situational  . Arthritis   . Complication of anesthesia   . Depression   . Family history of adverse reaction to anesthesia    Mother - nausea, daughter nausea  . GERD (gastroesophageal reflux disease)   . Heart murmur   . Migraines   . Osteopenia   . PONV (postoperative nausea and vomiting) 1976, 2001   Nausea   . Sjogren's disease ([facility])   . Unspecified vitamin D deficiency   . VSD (ventricular septal defect), perimembranous     Family History  Problem Relation Age of Onset  . Hypertension Mother   . Stroke Mother   . Hypertension Father   . Stroke Father   . Bladder Cancer Father   . Breast cancer Cousin   . Ovarian cancer Maternal Aunt   . Uterine cancer Maternal Grandmother   . ALS Other   . Multiple sclerosis Cousin   . Parkinson's disease Cousin     Past Surgical History:  Procedure Laterality Date  . ABLATION ON ENDOMETRIOSIS    . cervical conization  1976  . COLONOSCOPY  2012  . DILATION AND CURETTAGE OF UTERUS     x 2  . ESOPHAGOGASTRODUODENOSCOPY  01/28/13   Dr Bosie ClosSchooler- dysmotility  . lip biopsy     + sjogrens  . ORIF ANKLE FRACTURE Left 11/17/2018   Procedure: OPEN REDUCTION INTERNAL FIXATION (ORIF) LEFT ANKLE FRACTURE;  Surgeon: Nadara Mustarduda, Zerrick Hanssen V, MD;  [facility];  Service: Orthopedics;  Laterality: Left;   Social History   Occupational History  . Not on file  Tobacco Use  . Smoking status: Never Smoker  . Smokeless tobacco: Never Used  Substance and Sexual Activity  . Alcohol use: Yes    Comment: rare  . Drug use: No  . Sexual  activity: Not on file

## 2018-12-28 ENCOUNTER — Encounter: Payer: Self-pay | Admitting: Orthopedic Surgery

## 2018-12-29 ENCOUNTER — Ambulatory Visit (HOSPITAL_COMMUNITY)
Admission: RE | Admit: 2018-12-29 | Discharge: 2018-12-29 | Disposition: A | Discharge status: Home / Self Care | Payer: BC Managed Care – PPO | Source: Ambulatory Visit | Attending: Cardiovascular Disease | Admitting: Cardiovascular Disease

## 2018-12-29 ENCOUNTER — Other Ambulatory Visit: Payer: Self-pay

## 2018-12-29 DIAGNOSIS — I6529 Occlusion and stenosis of unspecified carotid artery: Secondary | ICD-10-CM

## 2018-12-29 DIAGNOSIS — B373 Candidiasis of vulva and vagina: Secondary | ICD-10-CM | POA: Diagnosis not present

## 2018-12-29 NOTE — Progress Notes (Deleted)
Assessment and Plan:  There are no diagnoses linked to this encounter.    Further disposition pending results of labs. Discussed med's effects and SE's.   Over 30 minutes of exam, counseling, chart review, and critical decision making was performed.   Future Appointments  Date Time Provider [place]  12/29/2018  2:00 PM [facility] 4 [facility]  12/30/2018  9:30 AM Liane Comber, NP [facility] None  01/03/2019  9:45 AM Newt Minion, MD [facility] None  01/10/2019 10:45 AM Newt Minion, MD [facility] None  06/08/2019 10:00 AM Vicie Mutters, PA-C [facility] None    ------------------------------------------------------------------------------------------------------------------   HPI 63 y.o.female presents for  Past Medical History:  Diagnosis Date  . Anemia   . Anxiety    situational  . Arthritis   . Complication of anesthesia   . Depression   . Family history of adverse reaction to anesthesia    Mother - nausea, daughter nausea  . GERD (gastroesophageal reflux disease)   . Heart murmur   . Migraines   . Osteopenia   . PONV (postoperative nausea and vomiting) 1976, 2001   Nausea   . Sjogren's disease ([place])   . Unspecified vitamin D deficiency   . VSD (ventricular septal defect), perimembranous      Allergies  Allergen Reactions  . Estradiol     Patch caused itching     Current Outpatient Medications on File Prior to Visit  Medication Sig  . acetaminophen (TYLENOL) 500 MG tablet Take 1,000 mg by mouth every 6 (six) hours as needed for moderate pain or headache.  . B Complex-C (SUPER B COMPLEX PO) Take 1 Dose by mouth 2 (two) times daily.   . calcium carbonate (TUMS - DOSED IN MG ELEMENTAL CALCIUM) 500 MG chewable tablet Chew 2 tablets by mouth 2 (two) times daily as needed for indigestion or heartburn.  . Calcium Carbonate-Vitamin D (CALTRATE 600+D PO) Take 1 tablet by mouth 2 (two) times daily. 630 mg calcium and 500 IU Vitamin D  .  Carboxymethylcellul-Glycerin (LUBRICATING EYE DROPS OP) Place 1 drop into both eyes daily as needed (dry eyes).  . Cholecalciferol (VITAMIN D) 2000 UNITS tablet Take 2,000 Units by mouth daily.  Marland Kitchen doxycycline (VIBRA-TABS) 100 MG tablet Take 1 tablet (100 mg total) by mouth 2 (two) times daily.  Marland Kitchen gabapentin (NEURONTIN) 100 MG capsule Take 1-3 capsules (100-300 mg total) by mouth at bedtime. As needed for sleep.  Marland Kitchen ibuprofen (ADVIL) 800 MG tablet Take 800 mg by mouth every 6 (six) hours as needed for moderate pain.  . Magnesium 400 MG TABS Take 400 mg by mouth daily.   Marland Kitchen nystatin ointment (MYCOSTATIN) Apply 1 application topically 2 (two) times daily. For 10-14 days  . Omega 3-6-9 Fatty Acids (OMEGA 3-6-9 COMPLEX PO) Take 1 tablet by mouth daily with lunch.   . oxyCODONE-acetaminophen (PERCOCET/ROXICET) 5-325 MG tablet Take 1 tablet by mouth 2 (two) times daily as needed.  . triamcinolone ointment (KENALOG) 0.1 % Apply 1 application topically 2 (two) times daily. For 10-14 days   No current facility-administered medications on file prior to visit.     ROS: all negative except above.   Physical Exam:  There were no vitals taken for this visit.  General Appearance: Well nourished, in no apparent distress. Eyes: PERRLA, EOMs, conjunctiva no swelling or erythema Sinuses: No Frontal/maxillary tenderness ENT/Mouth: Ext aud canals clear, TMs without erythema, bulging. No erythema, swelling, or exudate on post pharynx.  Tonsils not swollen or  erythematous. Hearing normal.  Neck: Supple, thyroid normal.  Respiratory: Respiratory effort normal, BS equal bilaterally without rales, rhonchi, wheezing or stridor.  Cardio: RRR with no MRGs. Brisk peripheral pulses without edema.  Abdomen: Soft, + BS.  Non tender, no guarding, rebound, hernias, masses. Lymphatics: Non tender without lymphadenopathy.  Musculoskeletal: Full ROM, 5/5 strength, normal gait.  Skin: Warm, dry without rashes, lesions,  ecchymosis.  Neuro: Cranial nerves intact. Normal muscle tone, no cerebellar symptoms. Sensation intact.  Psych: Awake and oriented X 3, normal affect, Insight and Judgment appropriate.     Dan MakerAshley C Dannel Rafter, NP 8:22 AM [facility]

## 2018-12-30 ENCOUNTER — Ambulatory Visit: Payer: BC Managed Care – PPO | Admitting: Adult Health

## 2018-12-30 ENCOUNTER — Telehealth: Payer: Self-pay | Admitting: Internal Medicine

## 2018-12-30 NOTE — Telephone Encounter (Signed)
patient referred to urology by gyn. requested fax our notes and labs to [facility]

## 2019-01-03 ENCOUNTER — Encounter: Payer: Self-pay | Admitting: Orthopedic Surgery

## 2019-01-03 ENCOUNTER — Ambulatory Visit (INDEPENDENT_AMBULATORY_CARE_PROVIDER_SITE_OTHER): Payer: BC Managed Care – PPO | Admitting: Orthopedic Surgery

## 2019-01-03 VITALS — Ht 68.0 in | Wt 173.0 lb

## 2019-01-03 DIAGNOSIS — S82852D Displaced trimalleolar fracture of left lower leg, subsequent encounter for closed fracture with routine healing: Secondary | ICD-10-CM

## 2019-01-03 NOTE — Progress Notes (Signed)
Office Visit Note   Patient: Bianca Fernandez           Date of Birth: Aug 19, 1955           MRN: [id number] Visit Date: 01/03/2019              Requested by: Unk Pinto, [address] [place],  [place] [address] PCP: Unk Pinto, MD  Chief Complaint  Patient presents with  . Left Ankle - Routine Post Op    11/17/18 left ankle ORIF      HPI: Patient is a 63 year old woman who presents approximately 6 weeks status post open reduction internal fixation left ankle fracture she is weightbearing as tolerated with a walker and a fracture boot uses Percocet for pain she has completed 1 week of doxycycline she did have some small ulcers which seem consistent with sterile stitch abscesses but she was started on antibiotics as a precaution.  Assessment & Plan: Visit Diagnoses:  1. Closed trimalleolar fracture of left ankle with routine healing, subsequent encounter     Plan: Patient will complete her antibiotics.  She will resume wearing the compression stocking she will continue working on the range of motion of the ankle to work the pump calf.  Follow-Up Instructions: Return in about 3 weeks (around 01/24/2019).   Ortho Exam  Patient is alert, oriented, no adenopathy, well-dressed, normal affect, normal respiratory effort. Examination the incision has healed nicely there is no redness no cellulitis no drainage no signs of infection the areas of drainage appear to be consistent with a sterile stitch abscess.  Imaging: No results found. No images are attached to the encounter.  Labs: Lab Results  Component Value Date   HGBA1C 5.6 06/03/2018   HGBA1C 5.5 05/26/2016   HGBA1C 5.9 (H) 05/24/2015   LABORGA Multiple bacterial morphotypes present, none 05/17/2014   LABORGA predominant. Suggest appropriate recollection if  05/17/2014   LABORGA clinically indicated. 05/17/2014     Lab Results  Component Value Date   ALBUMIN 4.6 05/26/2016   ALBUMIN 4.6  05/24/2015   ALBUMIN 4.5 09/12/2014    Lab Results  Component Value Date   MG 2.0 06/03/2018   MG 1.9 05/26/2017   MG 1.9 05/24/2015   Lab Results  Component Value Date   VD25OH 38 06/03/2018   VD25OH 35 05/26/2017   VD25OH 40 05/24/2015    No results found for: PREALBUMIN CBC EXTENDED Latest Ref Rng & Units 11/17/2018 08/02/2018 06/03/2018  WBC 4.0 - 10.5 K/uL 9.7 6.0 8.0  RBC 3.87 - 5.11 MIL/uL 4.05 4.12 4.45  HGB 12.0 - 15.0 g/dL 12.2 12.3 13.2  HCT 36.0 - 46.0 % 35.8(L) 36.4 39.2  PLT 150 - 400 K/uL 350 285 342  NEUTROABS 1,500 - 7,800 cells/uL - 3,564 5,576  LYMPHSABS 850 - 3,900 cells/uL - 1,560 1,696     Body mass index is 26.3 kg/m.  Orders:  No orders of the defined types were placed in this encounter.  No orders of the defined types were placed in this encounter.    Procedures: No procedures performed  Clinical Data: No additional findings.  ROS:  All other systems negative, except as noted in the HPI. Review of Systems  Objective: Vital Signs: Ht 5\' 8"  (1.727 m)   Wt 173 lb (78.5 kg)   BMI 26.30 kg/m   Specialty Comments:  No specialty comments available.  PMFS History: Patient Active Problem List   Diagnosis Date Noted  . Asymptomatic carotid  artery stenosis 11/30/2018  . Closed trimalleolar fracture of ankle, left, initial encounter 11/15/2018  . Anxiety 04/27/2017  . Rectocele, female 09/12/2014  . Cystocele with prolapse 09/12/2014  . Prediabetes 09/12/2014  . Hyperlipidemia 09/12/2014  . Elevated blood pressure reading without diagnosis of hypertension 09/12/2014  . Sjogren's disease ([facility])   . VSD (ventricular septal defect), perimembranous   . Osteopenia   . Vitamin D deficiency   . Migraines    Past Medical History:  Diagnosis Date  . Anemia   . Anxiety    situational  . Arthritis   . Complication of anesthesia   . Depression   . Family history of adverse reaction to anesthesia    Mother - nausea, daughter nausea  .  GERD (gastroesophageal reflux disease)   . Heart murmur   . Migraines   . Osteopenia   . PONV (postoperative nausea and vomiting) 1976, 2001   Nausea   . Sjogren's disease ([facility])   . Unspecified vitamin D deficiency   . VSD (ventricular septal defect), perimembranous     Family History  Problem Relation Age of Onset  . Hypertension Mother   . Stroke Mother   . Hypertension Father   . Stroke Father   . Bladder Cancer Father   . Breast cancer Cousin   . Ovarian cancer Maternal Aunt   . Uterine cancer Maternal Grandmother   . ALS Other   . Multiple sclerosis Cousin   . Parkinson's disease Cousin     Past Surgical History:  Procedure Laterality Date  . ABLATION ON ENDOMETRIOSIS    . cervical conization  1976  . COLONOSCOPY  2012  . DILATION AND CURETTAGE OF UTERUS     x 2  . ESOPHAGOGASTRODUODENOSCOPY  01/28/13   Dr Bosie ClosSchooler- dysmotility  . lip biopsy     + sjogrens  . ORIF ANKLE FRACTURE Left 11/17/2018   Procedure: OPEN REDUCTION INTERNAL FIXATION (ORIF) LEFT ANKLE FRACTURE;  Surgeon: Nadara Mustarduda, Nyjah Denio V, MD;  [facility];  Service: Orthopedics;  Laterality: Left;   Social History   Occupational History  . Not on file  Tobacco Use  . Smoking status: Never Smoker  . Smokeless tobacco: Never Used  Substance and Sexual Activity  . Alcohol use: Yes    Comment: rare  . Drug use: No  . Sexual activity: Not on file

## 2019-01-10 ENCOUNTER — Ambulatory Visit: Payer: BC Managed Care – PPO | Admitting: Orthopedic Surgery

## 2019-01-12 DIAGNOSIS — R102 Pelvic and perineal pain: Secondary | ICD-10-CM | POA: Diagnosis not present

## 2019-01-14 DIAGNOSIS — R351 Nocturia: Secondary | ICD-10-CM | POA: Diagnosis not present

## 2019-01-14 DIAGNOSIS — R350 Frequency of micturition: Secondary | ICD-10-CM | POA: Diagnosis not present

## 2019-01-24 ENCOUNTER — Ambulatory Visit: Payer: BC Managed Care – PPO | Admitting: Orthopedic Surgery

## 2019-01-26 ENCOUNTER — Ambulatory Visit: Payer: BC Managed Care – PPO | Admitting: Orthopedic Surgery

## 2019-01-27 DIAGNOSIS — R300 Dysuria: Secondary | ICD-10-CM | POA: Diagnosis not present

## 2019-01-27 DIAGNOSIS — N390 Urinary tract infection, site not specified: Secondary | ICD-10-CM | POA: Diagnosis not present

## 2019-01-27 DIAGNOSIS — B9620 Unspecified Escherichia coli [E. coli] as the cause of diseases classified elsewhere: Secondary | ICD-10-CM | POA: Diagnosis not present

## 2019-01-31 ENCOUNTER — Telehealth: Payer: Self-pay | Admitting: Family

## 2019-01-31 NOTE — Telephone Encounter (Signed)
Pt daughter kelly Veale called in said she is very worried about her mother, she said her mother is having a hard time coping with the fact that she broke her ankle and is expressing a lot of hopelessness and she doesn't happen to live in town so cant always be present to help her. Pt daughter is wondering if there is a way that we can help her mother. Pt daughter said if you could please don't tell her mother she called, she knows she doesn't have the right to discuss health information but is just advising of her mothers current behavior    [phone]

## 2019-01-31 NOTE — Telephone Encounter (Signed)
Will hold message and discuss with pt at office visit tomorrow with Junie Panning.

## 2019-02-01 ENCOUNTER — Ambulatory Visit (INDEPENDENT_AMBULATORY_CARE_PROVIDER_SITE_OTHER): Payer: BC Managed Care – PPO | Admitting: Family

## 2019-02-01 ENCOUNTER — Encounter: Payer: Self-pay | Admitting: Family

## 2019-02-01 ENCOUNTER — Ambulatory Visit (INDEPENDENT_AMBULATORY_CARE_PROVIDER_SITE_OTHER): Payer: BC Managed Care – PPO

## 2019-02-01 VITALS — Ht 68.0 in | Wt 173.0 lb

## 2019-02-01 DIAGNOSIS — S82852D Displaced trimalleolar fracture of left lower leg, subsequent encounter for closed fracture with routine healing: Secondary | ICD-10-CM | POA: Diagnosis not present

## 2019-02-01 DIAGNOSIS — G4700 Insomnia, unspecified: Secondary | ICD-10-CM

## 2019-02-01 MED ORDER — GABAPENTIN 300 MG PO CAPS: 300.0000 mg | ORAL_CAPSULE | Freq: Every day | ORAL | 1 refills | Status: DC

## 2019-02-01 NOTE — Telephone Encounter (Signed)
Erin please advise, thanks. 

## 2019-02-01 NOTE — Progress Notes (Signed)
Post-Op Visit Note   Patient: Bianca Fernandez           Date of Birth: Jan 07, 1956           MRN: [id number] Visit Date: 02/01/2019 PCP: Unk Pinto, MD  Chief Complaint:  Chief Complaint  Patient presents with   Left Foot - Routine Post Op    11/17/2018 left ankle ORIF    HPI:  HPI The patient is a 63 year old woman who presents today status post left trimalleolar ankle fracture on July 8.  She has been in a fracture boot full weightbearing with her walker.  She has been wearing compressive socks.  She does complain of some burning and nerve pain over the dorsum of her ankle is pleased with the resolution of her swelling with a compression stocking.  She was given 100 mg of gabapentin by her primary care provider to take to help her with sleep she has not been taking this consistently and has not noticed an improvement.  Ortho Exam On examination of the left ankle incisions are well-healed there is no erythema no drainage no open area.  She does have some mottling over her ankle the foot is normothermic.  There is minimal swelling.  Visit Diagnoses:  1. Closed trimalleolar fracture of left ankle with routine healing, subsequent encounter     Plan: advance weight bearing in regular shoe wear.  We will follow-up once more in 3 weeks to discuss her nerve pain.  Follow-Up Instructions: No follow-ups on file.   Imaging: Xr Ankle Complete Left  Result Date: 02/01/2019 Radiographs of left ankle show stable alignment of fixation hardware. No complicating feature. Interval callus formation.   Orders:  Orders Placed This Encounter  Procedures   XR Ankle Complete Left   No orders of the defined types were placed in this encounter.    PMFS History: Patient Active Problem List   Diagnosis Date Noted   Asymptomatic carotid artery stenosis 11/30/2018   Closed trimalleolar fracture of ankle, left, initial encounter 11/15/2018   Anxiety 04/27/2017   Rectocele, female  09/12/2014   Cystocele with prolapse 09/12/2014   Prediabetes 09/12/2014   Hyperlipidemia 09/12/2014   Elevated blood pressure reading without diagnosis of hypertension 09/12/2014   Sjogren's disease ([place])    VSD (ventricular septal defect), perimembranous    Osteopenia    Vitamin D deficiency    Migraines    Past Medical History:  Diagnosis Date   Anemia    Anxiety    situational   Arthritis    Complication of anesthesia    Depression    Family history of adverse reaction to anesthesia    Mother - nausea, daughter nausea   GERD (gastroesophageal reflux disease)    Heart murmur    Migraines    Osteopenia    PONV (postoperative nausea and vomiting) 1976, 2001   Nausea    Sjogren's disease ([place])    Unspecified vitamin D deficiency    VSD (ventricular septal defect), perimembranous     Family History  Problem Relation Age of Onset   Hypertension Mother    Stroke Mother    Hypertension Father    Stroke Father    Bladder Cancer Father    Breast cancer Cousin    Ovarian cancer Maternal Aunt    Uterine cancer Maternal Grandmother    ALS Other    Multiple sclerosis Cousin    Parkinson's disease Cousin     Past Surgical History:  Procedure Laterality Date  ABLATION ON ENDOMETRIOSIS     cervical conization  1976   COLONOSCOPY  2012   DILATION AND CURETTAGE OF UTERUS     x 2   ESOPHAGOGASTRODUODENOSCOPY  01/28/13   Dr Bosie Clos- dysmotility   lip biopsy     + sjogrens   ORIF ANKLE FRACTURE Left 11/17/2018   Procedure: OPEN REDUCTION INTERNAL FIXATION (ORIF) LEFT ANKLE FRACTURE;  Surgeon: Nadara Mustard, MD;  Location: [facility] OR;  Service: Orthopedics;  Laterality: Left;   Social History   Occupational History   Not on file  Tobacco Use   Smoking status: Never Smoker   Smokeless tobacco: Never Used  Substance and Sexual Activity   Alcohol use: Yes    Comment: rare   Drug use: No   Sexual activity: Not on file

## 2019-02-10 ENCOUNTER — Ambulatory Visit (INDEPENDENT_AMBULATORY_CARE_PROVIDER_SITE_OTHER): Payer: BC Managed Care – PPO

## 2019-02-10 ENCOUNTER — Other Ambulatory Visit: Payer: Self-pay

## 2019-02-10 VITALS — Temp 98.1°F

## 2019-02-10 DIAGNOSIS — Z23 Encounter for immunization: Secondary | ICD-10-CM

## 2019-02-10 NOTE — Progress Notes (Signed)
Patient presents to the office forHDFlu Vaccine. Vaccine administered toLEFTDeltoid withoutanycomplication. Temperature taken and recorded 

## 2019-02-22 ENCOUNTER — Ambulatory Visit (INDEPENDENT_AMBULATORY_CARE_PROVIDER_SITE_OTHER): Payer: BC Managed Care – PPO | Admitting: Family

## 2019-02-22 ENCOUNTER — Other Ambulatory Visit: Payer: Self-pay

## 2019-02-22 ENCOUNTER — Encounter: Payer: Self-pay | Admitting: Family

## 2019-02-22 VITALS — Ht 68.0 in | Wt 173.0 lb

## 2019-02-22 DIAGNOSIS — S82852D Displaced trimalleolar fracture of left lower leg, subsequent encounter for closed fracture with routine healing: Secondary | ICD-10-CM

## 2019-02-22 MED ORDER — GABAPENTIN 300 MG PO CAPS: 300.0000 mg | ORAL_CAPSULE | Freq: Every day | ORAL | 1 refills | Status: DC

## 2019-02-22 NOTE — Progress Notes (Signed)
Post-Op Visit Note   Patient: Bianca Fernandez           Date of Birth: [birth year]           MRN: [id number] Visit Date: 02/22/2019 PCP: Unk Pinto, MD  Chief Complaint:  Chief Complaint  Patient presents with  . Left Ankle - Routine Post Op    11/17/2018 Left ankle ORIF    HPI:  HPI The patient is a 63 year old woman who presents today status post left trimalleolar ankle fracture on July 8.  Patient's been full weightbearing in regular shoewear having minimal pain having difficulty getting back to her normal gait.  She is increased her dose to 300 mg of gabapentin once daily at bedtime states this is helping her sleep helping with her nerve pain.  Continues to have some numbness and burning pain in her toes and over the distal dorsum of her foot states is improving slowly  Would like to eventually wean off the gabapentin.  Ortho Exam On examination of the left ankle incisions are well-healed there is no erythema no drainage no open area.  No swelling.  Foot normothermic. Visit Diagnoses:  No diagnosis found.  Plan: Will refill her 300 mg gabapentin.  She will follow-up in the office as needed.  Follow-Up Instructions: Return if symptoms worsen or fail to improve.   Imaging: No results found.  Orders:  No orders of the defined types were placed in this encounter.  No orders of the defined types were placed in this encounter.    PMFS History: Patient Active Problem List   Diagnosis Date Noted  . Asymptomatic carotid artery stenosis 11/30/2018  . Closed trimalleolar fracture of ankle, left, initial encounter 11/15/2018  . Anxiety 04/27/2017  . Rectocele, female 09/12/2014  . Cystocele with prolapse 09/12/2014  . Prediabetes 09/12/2014  . Hyperlipidemia 09/12/2014  . Elevated blood pressure reading without diagnosis of hypertension 09/12/2014  . Sjogren's disease ([place])   . VSD (ventricular septal defect), perimembranous   . Osteopenia   . Vitamin D deficiency    . Migraines    Past Medical History:  Diagnosis Date  . Anemia   . Anxiety    situational  . Arthritis   . Complication of anesthesia   . Depression   . Family history of adverse reaction to anesthesia    Mother - nausea, daughter nausea  . GERD (gastroesophageal reflux disease)   . Heart murmur   . Migraines   . Osteopenia   . PONV (postoperative nausea and vomiting) 1976, 2001   Nausea   . Sjogren's disease ([place])   . Unspecified vitamin D deficiency   . VSD (ventricular septal defect), perimembranous     Family History  Problem Relation Age of Onset  . Hypertension Mother   . Stroke Mother   . Hypertension Father   . Stroke Father   . Bladder Cancer Father   . Breast cancer Cousin   . Ovarian cancer Maternal Aunt   . Uterine cancer Maternal Grandmother   . ALS Other   . Multiple sclerosis Cousin   . Parkinson's disease Cousin     Past Surgical History:  Procedure Laterality Date  . ABLATION ON ENDOMETRIOSIS    . cervical conization  1976  . COLONOSCOPY  2012  . DILATION AND CURETTAGE OF UTERUS     x 2  . ESOPHAGOGASTRODUODENOSCOPY  01/28/13   Dr Michail Sermon- dysmotility  . lip biopsy     + sjogrens  . ORIF  ANKLE FRACTURE Left 11/17/2018   Procedure: OPEN REDUCTION INTERNAL FIXATION (ORIF) LEFT ANKLE FRACTURE;  Surgeon: Nadara Mustard, MD;  Location: [facility] OR;  Service: Orthopedics;  Laterality: Left;   Social History   Occupational History  . Not on file  Tobacco Use  . Smoking status: Never Smoker  . Smokeless tobacco: Never Used  Substance and Sexual Activity  . Alcohol use: Yes    Comment: rare  . Drug use: No  . Sexual activity: Not on file

## 2019-02-24 DIAGNOSIS — R350 Frequency of micturition: Secondary | ICD-10-CM | POA: Diagnosis not present

## 2019-02-24 DIAGNOSIS — R300 Dysuria: Secondary | ICD-10-CM | POA: Diagnosis not present

## 2019-04-20 ENCOUNTER — Ambulatory Visit (INDEPENDENT_AMBULATORY_CARE_PROVIDER_SITE_OTHER): Payer: BC Managed Care – PPO | Admitting: Physician Assistant

## 2019-04-20 ENCOUNTER — Encounter: Payer: Self-pay | Admitting: Physician Assistant

## 2019-04-20 ENCOUNTER — Other Ambulatory Visit: Payer: Self-pay

## 2019-04-20 VITALS — BP 122/90 | HR 119 | Temp 97.5°F | Wt 140.8 lb

## 2019-04-20 DIAGNOSIS — R634 Abnormal weight loss: Secondary | ICD-10-CM | POA: Diagnosis not present

## 2019-04-20 DIAGNOSIS — R309 Painful micturition, unspecified: Secondary | ICD-10-CM | POA: Diagnosis not present

## 2019-04-20 DIAGNOSIS — Z1159 Encounter for screening for other viral diseases: Secondary | ICD-10-CM

## 2019-04-20 DIAGNOSIS — E538 Deficiency of other specified B group vitamins: Secondary | ICD-10-CM

## 2019-04-20 DIAGNOSIS — R197 Diarrhea, unspecified: Secondary | ICD-10-CM

## 2019-04-20 DIAGNOSIS — E611 Iron deficiency: Secondary | ICD-10-CM

## 2019-04-20 DIAGNOSIS — G4700 Insomnia, unspecified: Secondary | ICD-10-CM

## 2019-04-20 DIAGNOSIS — R002 Palpitations: Secondary | ICD-10-CM | POA: Diagnosis not present

## 2019-04-20 DIAGNOSIS — R1084 Generalized abdominal pain: Secondary | ICD-10-CM

## 2019-04-20 MED ORDER — AMITRIPTYLINE HCL 50 MG PO TABS: 50.0000 mg | ORAL_TABLET | Freq: Every evening | ORAL | 0 refills | Status: DC | PRN

## 2019-04-20 MED ORDER — BISOPROLOL FUMARATE 5 MG PO TABS: 5.0000 mg | ORAL_TABLET | Freq: Every day | ORAL | 3 refills | Status: DC

## 2019-04-20 NOTE — Progress Notes (Addendum)
Assessment and Plan: Palpitations Palpitations- check labs, r/u anemia, TSH, dehydration, electrolytes imbalance Check EKG shows NSR rate 89 without ST changes- pending labs will refer to cardiology for evaluation.  ? Anxiety- will try amitriptyline for sleep/urthera pain.  Will add on low dose BB for BP and palpitations.  Go to the ER if any chest pain, shortness of breath, nausea, dizziness, severe HA, changes vision/speech -     CBC with Diff -     COMPLETE METABOLIC PANEL WITH GFR -     TSH -     EKG 12-Lead -     bisoprolol (ZEBETA) 5 MG tablet; Take 1 tablet (5 mg total) by mouth at bedtime.  Diarrhea, unspecified type -     TSH -     Amylase - has appointment with GI Monday - will check labs to rule out electrolyte abnormalities - with weight loss may want to rule out IBD, chronic pancreatitis, etc.   Urinary pain -     Urinalysis, Routine w reflex microscopic -     Culture, Urine - following with urology but patient states has had new frequency x 1 day  Weight loss -     DG Chest 2 View; Future -     Sedimentation rate -     C-reactive protein -     HIV antibody -     Hepatitis C Antibody- born between [birth year] - with diarrhea and palpitations- generally not feeling well.  - UTD on MGM, last CXR 5 years ago repeat, last colonoscopy 2012 - has GI follow up with weight loss may want colonoscopy to rule out IBD, cancer, colitis. AB benign but if all labs negative may need CT AB.   ADDENDUM WITH WEIGHT LOSS, ELEVATED PANCREATIC ENZYME,WILL SCHEDULE CT AB WITH CONTRAST TO RULE OUT CANCER, CONTINUE FOLLOW UP WITH GI  Iron deficiency -     Iron,Total/Total Iron Binding Cap  B12 deficiency -     Vitamin B12  Screening for viral disease -     HIV antibody -     Hepatitis C Antibody- born between [birth year]  Insomnia, unspecified type -     amitriptyline (ELAVIL) 50 MG tablet; Take 1 tablet (50 mg total) by mouth at bedtime as needed for sleep. - if this does not help can  try seroquel for anxiety/depression/insomnia and sleep but will discuss with son and patient first.    The patient was advised to call immediately if she has any concerning symptoms in the interval. The patient voices understanding of current treatment options and is in agreement with the current care plan.The patient knows to call the clinic with any problems, questions or concerns or go to the ER if any further progression of symptoms.     HPI 63 y.o.female  With history of carotid stenosis, anxiety, chol, HTN, preDM. Migraines and VSD presents for elevated HR as well as a multitude of symptoms.   Son is here and states her HR has been has high as 178, and she feels shaky. During these palpations, can be with and without exertion, has had some dizziness with it.  She normally has a low BP but It has been elevated as well 140/90. She took 1/2 old xanax this AM that helped.   She has anxiety and not on treatment at this time. She has had dry mouth, sweet taste in her mouth, and has heard thumping in her right ear.   She broke her ankle in July of this  year but she states she has been having issues since that time. She has lost a lot of weight, 33 lbs since Oct, she does not feel like eating.  She has had diarrhea x breaking her ankle in July.  She has been seeing a urologist for vaginal/uretheral pain, normal cystoscopy and [place] per patient, need to request records. .  Patient is on NSAID AS needed, took 4 advil yesterday, is not on cold medication, is not on hormone replacement, just on vaginal suppository. They do not have known sleep apnea, states she does not sleep at all. She will sleep around 1 AM, dose until 3AM and then go wake up at [place]. Is on gabapentin 300mg  but not helps with sleep.   BMI is Body mass index is 21.41 kg/m. - down 33 lbs in 2 months. She is having diarrhea, mainly water, once a day, feeling shaky.  She has never smoked. Sensitive to cold. She has GI appointment  Monday. Last colonoscopy 2012 Had MGM 07/2018- normal CXR 2015- normal- has been 5 years will get another Echo 01/2015 [place] Tissue neck 2014 MRA head 05/2017 AB [place] 06/2010 shows gallstone Wt Readings from Last 3 Encounters:  04/20/19 140 lb 12.8 oz (63.9 kg)  02/22/19 173 lb (78.5 kg)  02/01/19 173 lb (78.5 kg)   Lab Results  Component Value Date   TSH 4.01 06/03/2018   She denies any associated end organ damage symptoms, such as one-sided weakness, numbness, tingling, slurring of speech, droopy face, swallowing difficulties, diplopia, vision loss, CP.   Blood pressure 122/90, pulse (!) 119, temperature (!) 97.5 F (36.4 C), weight 140 lb 12.8 oz (63.9 kg), SpO2 97 %.   Patient Active Problem List   Diagnosis Date Noted  . Asymptomatic carotid artery stenosis 11/30/2018  . Closed trimalleolar fracture of ankle, left, initial encounter 11/15/2018  . Anxiety 04/27/2017  . Rectocele, female 09/12/2014  . Cystocele with prolapse 09/12/2014  . Prediabetes 09/12/2014  . Hyperlipidemia 09/12/2014  . Elevated blood pressure reading without diagnosis of hypertension 09/12/2014  . Sjogren's disease ([place])   . VSD (ventricular septal defect), perimembranous   . Osteopenia   . Vitamin D deficiency   . Migraines         Current Outpatient Medications (Analgesics):  .  acetaminophen (TYLENOL) 500 MG tablet, Take 1,000 mg by mouth every 6 (six) hours as needed for moderate pain or headache. .  ibuprofen (ADVIL) 800 MG tablet, Take 800 mg by mouth every 6 (six) hours as needed for moderate pain.   Current Outpatient Medications (Other):  Marland Kitchen  B Complex-C (SUPER B COMPLEX PO), Take 1 Dose by mouth 2 (two) times daily.  .  calcium carbonate (TUMS - DOSED IN MG ELEMENTAL CALCIUM) 500 MG chewable tablet, Chew 2 tablets by mouth 2 (two) times daily as needed for indigestion or heartburn. .  Calcium Carbonate-Vitamin D (CALTRATE 600+D PO), Take 1 tablet by mouth 2 (two) times daily. 630 mg  calcium and 500 IU Vitamin D .  Cholecalciferol (VITAMIN D) 2000 UNITS tablet, Take 2,000 Units by mouth daily. Marland Kitchen  ESTRADIOL VA, Place vaginally. .  gabapentin (NEURONTIN) 300 MG capsule, Take 1 capsule (300 mg total) by mouth at bedtime. .  Magnesium 400 MG TABS, Take 400 mg by mouth daily.  .  Omega 3-6-9 Fatty Acids (OMEGA 3-6-9 COMPLEX PO), Take 1 tablet by mouth daily with lunch.  .  Probiotic Product (PROBIOTIC DAILY PO), Take by mouth. .  triamcinolone ointment (KENALOG)  0.1 %, Apply 1 application topically 2 (two) times daily. For 10-14 days  Allergies  Allergen Reactions  . Estradiol     Patch caused itching     ROS: all negative except above.   Physical Exam: Filed Weights   04/20/19 1433  Weight: 140 lb 12.8 oz (63.9 kg)   BP 122/90   Pulse (!) 119   Temp (!) 97.5 F (36.4 C)   Wt 140 lb 12.8 oz (63.9 kg)   SpO2 97%   BMI 21.41 kg/m  General Appearance: thin appearing, timid female in no distress.  Eyes: PERRLA, EOMs, conjunctiva no swelling or erythema ENT/Mouth: Ear canals normal without obstruction, swelling, erythema, or discharge.  TMs normal with no erythema, bulging, retraction, or loss of landmarks.  Oropharynx moist and clear with no exudate, erythema, or swelling. Slight depapillation of tongue, smooth, slightly erythematous but no obvious oral lesions Neck: Supple, thyroid normal. No bruits.  No cervical adenopathy Respiratory: Respiratory effort normal, Breath sounds clear A&P without wheeze, rhonchi, rales.   Cardio: RRR with holosystolic low pitched murmur heard best on the L 3rd ICS, no rubs or gallops. Brisk peripheral pulses without edema.  Chest: symmetric, with normal excursions Abdomen: Soft, nontender, no guarding, rebound, hernias, masses, or organomegaly.  Lymphatics: Non tender without lymphadenopathy. Musculoskeletal: Full ROM all peripheral extremities,5/5 strength, and normal gait.  Skin: Warm, dry without rashes, lesions,  ecchymosis. Neuro: Awake and oriented X 3, Cranial nerves intact, reflexes equal bilaterally. Normal muscle tone, no cerebellar symptoms. Sensation intact.  Psych:  normal affect, Insight and Judgment appropriate.   Quentin MullingAmanda Anye Brose, PA-C 3:00 PM [facility]

## 2019-04-20 NOTE — Patient Instructions (Signed)
Stop the gabapentin Start the amitriptyline at night, start 1/2-1 pill Take the bisoprolol  Go to the ER if any chest pain, shortness of breath, nausea, dizziness, severe HA, changes vision/speech   Palpitations Palpitations are feelings that your heartbeat is irregular or is faster than normal. It may feel like your heart is fluttering or skipping a beat. Palpitations are usually not a serious problem. They may be caused by many things, including smoking, caffeine, alcohol, stress, and certain medicines or drugs. Most causes of palpitations are not serious. However, some palpitations can be a sign of a serious problem. You may need further tests to rule out serious medical problems. Follow these instructions at home:     Pay attention to any changes in your condition. Take these actions to help manage your symptoms: Eating and drinking  Avoid foods and drinks that may cause palpitations. These may include: ? Caffeinated coffee, tea, soft drinks, diet pills, and energy drinks. ? Chocolate. ? Alcohol. Lifestyle  Take steps to reduce your stress and anxiety. Things that can help you relax include: ? Yoga. ? Mind-body activities, such as deep breathing, meditation, or using words and images to create positive thoughts (guided imagery). ? Physical activity, such as swimming, jogging, or walking. Tell your health care provider if your palpitations increase with activity. If you have chest pain or shortness of breath with activity, do not continue the activity until you are seen by your health care provider. ? Biofeedback. This is a method that helps you learn to use your mind to control things in your body, such as your heartbeat.  Do not use drugs, including cocaine or ecstasy. Do not use marijuana.  Get plenty of rest and sleep. Keep a regular bed time. General instructions  Take over-the-counter and prescription medicines only as told by your health care provider.  Do not use any  products that contain nicotine or tobacco, such as cigarettes and e-cigarettes. If you need help quitting, ask your health care provider.  Keep all follow-up visits as told by your health care provider. This is important. These may include visits for further testing if palpitations do not go away or get worse. Contact a health care provider if you:  Continue to have a fast or irregular heartbeat after 24 hours.  Notice that your palpitations occur more often. Get help right away if you:  Have chest pain or shortness of breath.  Have a severe headache.  Feel dizzy or you faint. Summary  Palpitations are feelings that your heartbeat is irregular or is faster than normal. It may feel like your heart is fluttering or skipping a beat.  Palpitations may be caused by many things, including smoking, caffeine, alcohol, stress, certain medicines, and drugs.  Although most causes of palpitations are not serious, some causes can be a sign of a serious medical problem.  Get help right away if you faint or have chest pain, shortness of breath, a severe headache, or dizziness. This information is not intended to replace advice given to you by your health care provider. Make sure you discuss any questions you have with your health care provider. Document Released: 04/25/2000 Document Revised: 06/10/2017 Document Reviewed: 06/10/2017 Elsevier Patient Education  2020 [employer].   Food Choices to Help Relieve Diarrhea, Adult When you have diarrhea, the foods you eat and your eating habits are very important. Choosing the right foods and drinks can help:  Relieve diarrhea.  Replace lost fluids and nutrients.  Prevent dehydration. What  general guidelines should I follow?  Relieving diarrhea  Choose foods with less than 2 g or .07 oz. of fiber per serving.  Limit fats to less than 8 tsp (38 g or 1.34 oz.) a day.  Avoid the following: ? Foods and beverages sweetened with high-fructose corn  syrup, honey, or sugar alcohols such as xylitol, sorbitol, and mannitol. ? Foods that contain a lot of fat or sugar. ? Fried, greasy, or spicy foods. ? High-fiber grains, breads, and cereals. ? Raw fruits and vegetables.  Eat foods that are rich in probiotics. These foods include dairy products such as yogurt and fermented milk products. They help increase healthy bacteria in the stomach and intestines (gastrointestinal tract, or GI tract).  If you have lactose intolerance, avoid dairy products. These may make your diarrhea worse.  Take medicine to help stop diarrhea (antidiarrheal medicine) only as told by your health care provider. Replacing nutrients  Eat small meals or snacks every 3-4 hours.  Eat bland foods, such as white rice, toast, or baked potato, until your diarrhea starts to get better. Gradually reintroduce nutrient-rich foods as tolerated or as told by your health care provider. This includes: ? Well-cooked protein foods. ? Peeled, seeded, and soft-cooked fruits and vegetables. ? Low-fat dairy products.  Take vitamin and mineral supplements as told by your health care provider. Preventing dehydration  Start by sipping water or a special solution to prevent dehydration (oral rehydration solution, ORS). Urine that is clear or pale yellow means that you are getting enough fluid.  Try to drink at least 8-10 cups of fluid each day to help replace lost fluids.  You may add other liquids in addition to water, such as clear juice or decaffeinated sports drinks, as tolerated or as told by your health care provider.  Avoid drinks with caffeine, such as coffee, tea, or soft drinks.  Avoid alcohol. What foods are recommended?     The items listed may not be a complete list. Talk with your health care provider about what dietary choices are best for you. Grains White rice. White, [place], or pita breads (fresh or toasted), including plain rolls, buns, or bagels. White pasta.  Saltine, soda, or graham crackers. Pretzels. Low-fiber cereal. Cooked cereals made with water (such as cornmeal, farina, or cream cereals). Plain muffins. Matzo. Melba toast. Zwieback. Vegetables Potatoes (without the skin). Most well-cooked and canned vegetables without skins or seeds. Tender lettuce. Fruits Apple sauce. Fruits canned in juice. Cooked apricots, cherries, grapefruit, peaches, pears, or plums. Fresh bananas and cantaloupe. Meats and other protein foods Baked or boiled chicken. Eggs. Tofu. Fish. Seafood. Smooth nut butters. Ground or well-cooked tender beef, ham, veal, lamb, pork, or poultry. Dairy Plain yogurt, kefir, and unsweetened liquid yogurt. Lactose-free milk, buttermilk, skim milk, or soy milk. Low-fat or nonfat hard cheese. Beverages Water. Low-calorie sports drinks. Fruit juices without pulp. Strained tomato and vegetable juices. Decaffeinated teas. Sugar-free beverages not sweetened with sugar alcohols. Oral rehydration solutions, if approved by your health care provider. Seasoning and other foods Bouillon, broth, or soups made from recommended foods. What foods are not recommended? The items listed may not be a complete list. Talk with your health care provider about what dietary choices are best for you. Grains Whole grain, whole wheat, bran, or rye breads, rolls, pastas, and crackers. Wild or brown rice. Whole grain or bran cereals. Barley. Oats and oatmeal. Corn tortillas or taco shells. Granola. Popcorn. Vegetables Raw vegetables. Fried vegetables. Cabbage, broccoli, Brussels sprouts, artichokes, baked  beans, beet greens, corn, kale, legumes, peas, sweet potatoes, and yams. Potato skins. Cooked spinach and cabbage. Fruits Dried fruit, including raisins and dates. Raw fruits. Stewed or dried prunes. Canned fruits with syrup. Meat and other protein foods Fried or fatty meats. Deli meats. Chunky nut butters. Nuts and seeds. Beans and lentils. Berniece Salines. Hot dogs.  Sausage. Dairy High-fat cheeses. Whole milk, chocolate milk, and beverages made with milk, such as milk shakes. Half-and-half. Cream. sour cream. Ice cream. Beverages Caffeinated beverages (such as coffee, tea, soda, or energy drinks). Alcoholic beverages. Fruit juices with pulp. Prune juice. Soft drinks sweetened with high-fructose corn syrup or sugar alcohols. High-calorie sports drinks. Fats and oils Butter. Cream sauces. Margarine. Salad oils. Plain salad dressings. Olives. Avocados. Mayonnaise. Sweets and desserts Sweet rolls, doughnuts, and sweet breads. Sugar-free desserts sweetened with sugar alcohols such as xylitol and sorbitol. Seasoning and other foods Honey. Hot sauce. Chili powder. Gravy. Cream-based or milk-based soups. Pancakes and waffles. Summary  When you have diarrhea, the foods you eat and your eating habits are very important.  Make sure you get at least 8-10 cups of fluid each day, or enough to keep your urine clear or pale yellow.  Eat bland foods and gradually reintroduce healthy, nutrient-rich foods as tolerated, or as told by your health care provider.  Avoid high-fiber, fried, greasy, or spicy foods. This information is not intended to replace advice given to you by your health care provider. Make sure you discuss any questions you have with your health care provider. Document Released: 07/19/2003 Document Revised: 08/19/2018 Document Reviewed: 04/25/2016 Elsevier Patient Education  2020 [employer].

## 2019-04-21 ENCOUNTER — Ambulatory Visit
Admission: RE | Admit: 2019-04-21 | Discharge: 2019-04-21 | Disposition: A | Discharge status: Home / Self Care | Payer: BC Managed Care – PPO | Source: Ambulatory Visit | Attending: Physician Assistant | Admitting: Physician Assistant

## 2019-04-21 DIAGNOSIS — R634 Abnormal weight loss: Secondary | ICD-10-CM

## 2019-04-21 NOTE — Addendum Note (Signed)
Addended by: Vicie Mutters R on: 04/21/2019 12:57 PM   Modules accepted: Orders

## 2019-04-22 LAB — CBC WITH DIFFERENTIAL/PLATELET
Absolute Monocytes: 630 cells/uL (ref 200–950)
Basophils Absolute: 47 cells/uL (ref 0–200)
Basophils Relative: 0.5 %
Eosinophils Absolute: 19 cells/uL (ref 15–500)
Eosinophils Relative: 0.2 %
HCT: 37.8 % (ref 35.0–45.0)
Hemoglobin: 12.7 g/dL (ref 11.7–15.5)
Lymphs Abs: 1664 cells/uL (ref 850–3900)
MCH: 30.5 pg (ref 27.0–33.0)
MCHC: 33.6 g/dL (ref 32.0–36.0)
MCV: 90.6 fL (ref 80.0–100.0)
MPV: 9.5 fL (ref 7.5–12.5)
Monocytes Relative: 6.7 %
Neutro Abs: 7041 cells/uL (ref 1500–7800)
Neutrophils Relative %: 74.9 %
Platelets: 356 10*3/uL (ref 140–400)
RBC: 4.17 10*6/uL (ref 3.80–5.10)
RDW: 13.3 % (ref 11.0–15.0)
Total Lymphocyte: 17.7 %
WBC: 9.4 10*3/uL (ref 3.8–10.8)

## 2019-04-22 LAB — HEPATITIS C ANTIBODY
Hepatitis C Ab: NONREACTIVE
SIGNAL TO CUT-OFF: 0 (ref <1.00)

## 2019-04-22 LAB — COMPLETE METABOLIC PANEL WITH GFR
AG Ratio: 1.9 (calc) (ref 1.0–2.5)
ALT: 23 U/L (ref 6–29)
AST: 17 U/L (ref 10–35)
Albumin: 4.7 g/dL (ref 3.6–5.1)
Alkaline phosphatase (APISO): 60 U/L (ref 37–153)
BUN: 7 mg/dL (ref 7–25)
CO2: 25 mmol/L (ref 20–32)
Calcium: 10 mg/dL (ref 8.6–10.4)
Chloride: 97 mmol/L — ABNORMAL LOW (ref 98–110)
Creat: 0.67 mg/dL (ref 0.50–0.99)
GFR, Est African American: 107 mL/min/{1.73_m2} (ref >=60)
GFR, Est Non African American: 92 mL/min/{1.73_m2} (ref >=60)
Globulin: 2.5 g/dL (calc) (ref 1.9–3.7)
Glucose, Bld: 94 mg/dL (ref 65–99)
Potassium: 4.1 mmol/L (ref 3.5–5.3)
Sodium: 134 mmol/L — ABNORMAL LOW (ref 135–146)
Total Bilirubin: 0.6 mg/dL (ref 0.2–1.2)
Total Protein: 7.2 g/dL (ref 6.1–8.1)

## 2019-04-22 LAB — URINALYSIS, ROUTINE W REFLEX MICROSCOPIC
Bacteria, UA: NONE SEEN /HPF
Bilirubin Urine: NEGATIVE
Glucose, UA: NEGATIVE
Hgb urine dipstick: NEGATIVE
Hyaline Cast: NONE SEEN /LPF
Nitrite: NEGATIVE
Protein, ur: NEGATIVE
RBC / HPF: NONE SEEN /HPF (ref 0–2)
Specific Gravity, Urine: 1.008 (ref 1.001–1.03)
Squamous Epithelial / HPF: NONE SEEN /HPF (ref <=5)
pH: 7 (ref 5.0–8.0)

## 2019-04-22 LAB — TSH: TSH: 2.66 mIU/L (ref 0.40–4.50)

## 2019-04-22 LAB — URINE CULTURE
MICRO NUMBER:: 1181350
Result:: NO GROWTH
SPECIMEN QUALITY:: ADEQUATE

## 2019-04-22 LAB — C-REACTIVE PROTEIN: CRP: 3.2 mg/L (ref <8.0)

## 2019-04-22 LAB — SEDIMENTATION RATE: Sed Rate: 11 mm/h (ref 0–30)

## 2019-04-22 LAB — VITAMIN B12: Vitamin B-12: 632 pg/mL (ref 200–1100)

## 2019-04-22 LAB — IRON, TOTAL/TOTAL IRON BINDING CAP
%SAT: 14 % (calc) — ABNORMAL LOW (ref 16–45)
Iron: 39 ug/dL — ABNORMAL LOW (ref 45–160)
TIBC: 282 mcg/dL (calc) (ref 250–450)

## 2019-04-22 LAB — HIV ANTIBODY (ROUTINE TESTING W REFLEX): HIV 1&2 Ab, 4th Generation: NONREACTIVE

## 2019-04-22 LAB — AMYLASE: Amylase: 158 U/L — ABNORMAL HIGH (ref 21–101)

## 2019-04-23 ENCOUNTER — Other Ambulatory Visit: Payer: Self-pay | Admitting: Internal Medicine

## 2019-04-23 DIAGNOSIS — R002 Palpitations: Secondary | ICD-10-CM

## 2019-04-23 MED ORDER — BISOPROLOL FUMARATE 5 MG PO TABS: ORAL_TABLET | ORAL | 3 refills | Status: DC

## 2019-04-25 ENCOUNTER — Telehealth: Payer: Self-pay | Admitting: Internal Medicine

## 2019-04-25 DIAGNOSIS — K5900 Constipation, unspecified: Secondary | ICD-10-CM | POA: Diagnosis not present

## 2019-04-25 DIAGNOSIS — E611 Iron deficiency: Secondary | ICD-10-CM | POA: Diagnosis not present

## 2019-04-25 DIAGNOSIS — R194 Change in bowel habit: Secondary | ICD-10-CM | POA: Diagnosis not present

## 2019-04-25 NOTE — Telephone Encounter (Signed)
Patient self scheduled w/ Dr Wilford Corner. Per Vicie Mutters,  faxed most recent note and labs to Dr Wilford Corner, [facility]- patient being seen today.

## 2019-05-03 ENCOUNTER — Ambulatory Visit: Payer: BC Managed Care – PPO | Attending: Internal Medicine

## 2019-05-03 DIAGNOSIS — Z20822 Contact with and (suspected) exposure to covid-19: Secondary | ICD-10-CM

## 2019-05-03 DIAGNOSIS — Z20828 Contact with and (suspected) exposure to other viral communicable diseases: Secondary | ICD-10-CM | POA: Diagnosis not present

## 2019-05-04 ENCOUNTER — Other Ambulatory Visit: Payer: BC Managed Care – PPO

## 2019-05-05 LAB — NOVEL CORONAVIRUS, NAA: SARS-CoV-2, NAA: NOT DETECTED

## 2019-05-11 ENCOUNTER — Other Ambulatory Visit: Payer: Self-pay

## 2019-05-11 ENCOUNTER — Ambulatory Visit
Admission: RE | Admit: 2019-05-11 | Discharge: 2019-05-11 | Disposition: A | Discharge status: Home / Self Care | Payer: BC Managed Care – PPO | Source: Ambulatory Visit | Attending: Physician Assistant | Admitting: Physician Assistant

## 2019-05-11 DIAGNOSIS — R1084 Generalized abdominal pain: Secondary | ICD-10-CM

## 2019-05-11 DIAGNOSIS — R634 Abnormal weight loss: Secondary | ICD-10-CM | POA: Diagnosis not present

## 2019-05-11 DIAGNOSIS — K5900 Constipation, unspecified: Secondary | ICD-10-CM | POA: Diagnosis not present

## 2019-05-11 DIAGNOSIS — R197 Diarrhea, unspecified: Secondary | ICD-10-CM

## 2019-05-11 MED ORDER — IOPAMIDOL (ISOVUE-300) INJECTION 61%
100.0000 mL | Freq: Once | INTRAVENOUS | Status: AC | PRN
  Administered 2019-05-11: 100 mL via INTRAVENOUS

## 2019-06-02 ENCOUNTER — Other Ambulatory Visit: Payer: Self-pay | Admitting: Internal Medicine

## 2019-06-02 DIAGNOSIS — G4700 Insomnia, unspecified: Secondary | ICD-10-CM

## 2019-06-02 MED ORDER — AMITRIPTYLINE HCL 50 MG PO TABS: ORAL_TABLET | ORAL | 1 refills | Status: DC

## 2019-06-03 NOTE — Progress Notes (Deleted)
Complete Physical  Assessment and Plan:  Mixed hyperlipidemia -continue medications, check lipids, decrease fatty foods, increase activity.  -     Lipid panel  Elevated blood pressure reading without diagnosis of hypertension - continue medications, DASH diet, exercise and monitor at home. Call if greater than 130/80.  -     CBC with Differential/Platelet -     BASIC METABOLIC PANEL WITH GFR -     Hepatic function panel -     TSH -     Urinalysis, Routine w reflex microscopic -     Microalbumin / creatinine urine ratio -     EKG 12-Lead  Prediabetes Discussed disease progression and risks Discussed diet/exercise, weight management and risk modification  Vitamin D deficiency -     VITAMIN D 25 Hydroxy (Vit-D Deficiency, Fractures)  Sjogren's syndrome, with unspecified organ involvement ([place]) Continue follow up  Other migraine without status migrainosus, not intractable Avoid triggers  VSD (ventricular septal defect), perimembranous Monitoring  Rectocele, female Monitoring  Osteopenia, unspecified location DEXA follow up  Cystocele with prolapse Monitoring  Anxiety Continue buspar  Acute effusion of right ear -     Ambulatory referral to ENT  Depression, major, recurrent, in partial remission ([place]) Continue lexapro for now and buspar, may switch to celexa or wellbutrin after ear evaluatoin  Medication management -     Magnesium  Screening, anemia, deficiency, iron -     Iron,Total/Total Iron Binding Cap -     Vitamin B12   Discussed med's effects and SE's. Screening labs and tests as requested with regular follow-up as recommended.  Future Appointments  Date Time Provider [place]  06/08/2019 10:00 AM Vicie Mutters, PA-C [facility] None  06/07/2020  9:00 AM Vicie Mutters, PA-C [facility] None     HPI  64 y.o. married WF  presents for a complete physical.  She is off buspar and lexapro, feels she is doing well.  Patient had recent  diarrhea,  Palpitations with weight loss.  Had normal CT AB, followed up with Dr. Michail Sermon.  Started on elavil for insomnia and BB for palpitations.   Her blood pressure has been controlled at home, at home it is normal, today their BP is  .  She does not workout. She denies chest pain, shortness of breath, dizziness.   BMI is There is no height or weight on file to calculate BMI., she is working on diet and exercise. Wt Readings from Last 3 Encounters:  04/20/19 140 lb 12.8 oz (63.9 kg)  02/22/19 173 lb (78.5 kg)  02/01/19 173 lb (78.5 kg)   She is not on cholesterol medication and denies myalgias. Her cholesterol is at goal. The cholesterol last visit was:  Lab Results  Component Value Date   CHOL 198 06/03/2018   HDL 72 06/03/2018   LDLCALC 108 (H) 06/03/2018   TRIG 85 06/03/2018   CHOLHDL 2.8 06/03/2018  . She has been working on diet and exercise for prediabetes, she is not on bASA, she is not on ACE/ARB and denies foot ulcerations, hyperglycemia, hypoglycemia , increased appetite, nausea, paresthesia of the feet, polydipsia, polyuria, visual disturbances, vomiting and weight loss. Last A1C in the office was:  Lab Results  Component Value Date   HGBA1C 5.6 06/03/2018   Patient is on Vitamin D supplement.   Lab Results  Component Value Date   VD25OH 38 06/03/2018     She has sjogrens that is being monitored.   She does still see Dr. Nori Riis yearly.  He is handling her pap smears 12/2015, estrogen use and also her dexa scans.   Current Medications:    Current Outpatient Medications (Cardiovascular):  .  bisoprolol (ZEBETA) 5 MG tablet, Take 1 tablet at Bedtime for BP   Current Outpatient Medications (Analgesics):  .  acetaminophen (TYLENOL) 500 MG tablet, Take 1,000 mg by mouth every 6 (six) hours as needed for moderate pain or headache. .  ibuprofen (ADVIL) 800 MG tablet, Take 800 mg by mouth every 6 (six) hours as needed for moderate pain.   Current Outpatient  Medications (Other):  .  amitriptyline (ELAVIL) 50 MG tablet, Take 1 tablet at Bedtime as needed for Sleep .  B Complex-C (SUPER B COMPLEX PO), Take 1 Dose by mouth 2 (two) times daily.  .  calcium carbonate (TUMS - DOSED IN MG ELEMENTAL CALCIUM) 500 MG chewable tablet, Chew 2 tablets by mouth 2 (two) times daily as needed for indigestion or heartburn. .  Calcium Carbonate-Vitamin D (CALTRATE 600+D PO), Take 1 tablet by mouth 2 (two) times daily. 630 mg calcium and 500 IU Vitamin D .  Cholecalciferol (VITAMIN D) 2000 UNITS tablet, Take 2,000 Units by mouth daily. Marland Kitchen  ESTRADIOL VA, Place vaginally. .  gabapentin (NEURONTIN) 300 MG capsule, Take 1 capsule (300 mg total) by mouth at bedtime. .  Magnesium 400 MG TABS, Take 400 mg by mouth daily.  .  Omega 3-6-9 Fatty Acids (OMEGA 3-6-9 COMPLEX PO), Take 1 tablet by mouth daily with lunch.  .  Probiotic Product (PROBIOTIC DAILY PO), Take by mouth. .  triamcinolone ointment (KENALOG) 0.1 %, Apply 1 application topically 2 (two) times daily. For 10-14 days  Health Maintenance:   Immunization History  Administered Date(s) Administered  . Influenza, High Dose Seasonal PF 02/10/2019  . Influenza, Seasonal, Injecte, Preservative Fre 05/26/2016  . Influenza-Unspecified 01/25/2013, 02/09/2014  . PPD Test 04/19/2013, 05/17/2014  . Pneumococcal Polysaccharide-23 04/19/2013  . Tdap 08/20/2010    Tetanus: 2012 Pneumovax:  2014 Flu vaccine: 2019 Pap: Dr. Jennette Kettle MGM: 2017 DUE [id number] Colonoscopy: 2012 US carotid 06/01/2017-- right ICA 60-79%, left 1-39%.  MRA head for pulsatile tinnitus negative Last Dental Exam:  Twice yearly visits Last Eye Exam: Dr. [place] 2018  Patient Care Team: Lucky Cowboy, MD as PCP - General (Internal Medicine) [place], Heather, OD (Optometry) Freddy Finner, MD as Consulting Physician (Obstetrics and Gynecology) Charlott Rakes, MD as Consulting Physician (Gastroenterology) Thurmon Fair, MD as Consulting Physician  (Cardiology) [place], Amy, MD as Consulting Physician (Dermatology) Rossie Muskrat, MD as Consulting Physician (Rheumatology) Drema Halon, MD as Consulting Physician (Otolaryngology)  Medical History:  Past Medical History:  Diagnosis Date  . Anemia   . Anxiety    situational  . Arthritis   . Complication of anesthesia   . Depression   . Family history of adverse reaction to anesthesia    Mother - nausea, daughter nausea  . GERD (gastroesophageal reflux disease)   . Heart murmur   . Migraines   . Osteopenia   . PONV (postoperative nausea and vomiting) 1976, 2001   Nausea   . Sjogren's disease ([facility])   . Unspecified vitamin D deficiency   . VSD (ventricular septal defect), perimembranous    Allergies Allergies  Allergen Reactions  . Estradiol     Patch caused itching     SURGICAL HISTORY She  has a past surgical history that includes Esophagogastroduodenoscopy (01/28/13); Dilation and curettage of uterus; Ablation on endometriosis; cervical conization (1976); lip biopsy; Colonoscopy (2012);  and ORIF ankle fracture (Left, 11/17/2018).   FAMILY HISTORY Her family history includes ALS in an other family member; Bladder Cancer in her father; Breast cancer in her cousin; Hypertension in her father and mother; Multiple sclerosis in her cousin; Ovarian cancer in her maternal aunt; Parkinson's disease in her cousin; Stroke in her father and mother; Uterine cancer in her maternal grandmother.   SOCIAL HISTORY She  reports that she has never smoked. She has never used smokeless tobacco. She reports current alcohol use. She reports that she does not use drugs.   Review of Systems: Review of Systems  Constitutional: Negative for chills, fever and malaise/fatigue.  HENT: Positive for ear pain and tinnitus. Negative for congestion, ear discharge, hearing loss, nosebleeds, sinus pain and sore throat.   Eyes: Negative.        Dry eye  Respiratory: Negative for cough, shortness  of breath, wheezing and stridor.   Cardiovascular: Negative for chest pain, palpitations and leg swelling.  Gastrointestinal: Negative for abdominal pain, blood in stool, constipation, diarrhea, heartburn and melena.  Genitourinary: Negative.   Musculoskeletal: Negative.   Skin: Negative.   Neurological: Positive for dizziness and headaches. Negative for tingling, tremors, sensory change, speech change, focal weakness, seizures and loss of consciousness.  Psychiatric/Behavioral: Positive for depression. The patient is not nervous/anxious and does not have insomnia.     Physical Exam: Estimated body mass index is 21.41 kg/m as calculated from the following:   Height as of 02/22/19: 5\' 8"  (1.727 m).   Weight as of 04/20/19: 140 lb 12.8 oz (63.9 kg). There were no vitals taken for this visit.  General Appearance: Well nourished well developed, in no apparent distress.  Eyes: PERRLA, EOMs, conjunctiva no swelling or erythema ENT/Mouth: Ear canals normal without obstruction, swelling, erythema, or discharge.  TMs normal left ear with no erythema, bulging, retraction, or loss of landmarks but right ear with hole in TM at 10 oclock, no pus or discharge.  Oropharynx moist and clear with no exudate, erythema, or swelling.  Neck: Supple, thyroid normal. No bruits.  No cervical adenopathy Respiratory: Respiratory effort normal, Breath sounds clear A&P without wheeze, rhonchi, rales.   Cardio: RRR with holosystolic low pitched murmur heard best on the L 3rd ICS, no rubs or gallops. Brisk peripheral pulses without edema.  Chest: symmetric, with normal excursions Breasts: Deferred to obgyn.  Abdomen: Soft, nontender, no guarding, rebound, hernias, masses, or organomegaly.  Lymphatics: Non tender without lymphadenopathy. Musculoskeletal: Full ROM all peripheral extremities,5/5 strength, and normal gait.  Skin: Warm, dry without rashes, lesions, ecchymosis. Neuro: Awake and oriented X 3, Cranial nerves  intact, reflexes equal bilaterally. Normal muscle tone, no cerebellar symptoms. Sensation intact.  Psych:  normal affect, Insight and Judgment appropriate.   EKG: WNL, no ST changes    Over 40 minutes of exam, counseling, chart review and critical decision making was performed  14/9/20 12:52 PM [facility]

## 2019-06-07 ENCOUNTER — Other Ambulatory Visit: Payer: Self-pay | Admitting: Internal Medicine

## 2019-06-07 DIAGNOSIS — G4700 Insomnia, unspecified: Secondary | ICD-10-CM

## 2019-06-07 MED ORDER — AMITRIPTYLINE HCL 50 MG PO TABS: ORAL_TABLET | ORAL | 3 refills | Status: DC

## 2019-06-08 ENCOUNTER — Encounter: Payer: Self-pay | Admitting: Physician Assistant

## 2019-07-12 LAB — HM COLONOSCOPY

## 2019-07-13 NOTE — Progress Notes (Signed)
Complete Physical  Assessment and Plan:  Mixed hyperlipidemia -continue medications, check lipids, decrease fatty foods, increase activity.  -     Lipid panel  Elevated blood pressure reading without diagnosis of hypertension - continue medications, DASH diet, exercise and monitor at home. Call if greater than 130/80.  -     CBC with Differential/Platelet -     BASIC METABOLIC PANEL WITH GFR -     Hepatic function panel -     TSH -     Urinalysis, Routine w reflex microscopic -     Microalbumin / creatinine urine ratio -     EKG 12-Lead  ABNORMAL GLUCOSE Discussed disease progression and risks Discussed diet/exercise, weight management and risk modification  Vitamin D deficiency -     VITAMIN D 25 Hydroxy (Vit-D Deficiency, Fractures)  Sjogren's syndrome, with unspecified organ involvement ([place]) Continue follow up  Other migraine without status migrainosus, not intractable Avoid triggers  VSD (ventricular septal defect), perimembranous Monitoring  Rectocele, female Monitoring  Osteopenia, unspecified location 2020- REPEAT 2 YEARS  Cystocele with prolapse Monitoring  Anxiety Will do propranolol as needed for anxiety If any worsening depression/anxiety will add on lexapro  Depression, major, recurrent, in partial remission ([place]) Will do propranolol as needed for anxiety If any worsening depression/anxiety will add on lexapro  Medication management -     Magnesium  Iron def Check labs Saw Dr. Michail Sermon recently  Discussed med's effects and SE's. Screening labs and tests as requested with regular follow-up as recommended.  Future Appointments  Date Time Provider [place]  07/13/2020  9:30 AM Vicie Mutters, PA-C [facility] None     HPI  64 y.o. married WF  presents for a complete physical.  Patient was seen for palpitations in Dec 2020, she had a normal EKG, was put on low dose of amitriptyline for sleep/urthera pain and a low dose of BB was  added ziac 5. She states the ziac was helpful but she would like to take something AS needed and not daily. She is sleeping better, the vaginal estrogen is helping, she is taking the amitriptyline as needed.   She has been on lexapro and buspar in the past but she states she does not feel the need at this time, her mom died last year, her cat died, she broke her ankle, husband was in a wreck and just had a lot of compiled events but things have calmed down.   She has been having weight loss and diarrhea and iron def, UTD on MGM, normal CXR, and normal CT AB. She had an elevated amylase, negative HIV, ESR, CRP, Hepatitis.  Saw Dr. Michail Sermon for diarrhea, weight loss and iron def. Started on linzess 72 and set up for EGD/colonoscopy last tuesday.  Lab Results  Component Value Date   IRON 39 (L) 04/20/2019   TIBC 282 04/20/2019   BMI is Body mass index is 21.9 kg/m., she is working on diet and exercise. Wt Readings from Last 3 Encounters:  07/14/19 144 lb (65.3 kg)  04/20/19 140 lb 12.8 oz (63.9 kg)  02/22/19 173 lb (78.5 kg)    Her blood pressure has been controlled at home, at home it is normal, today their BP is BP: 108/70.  She does not workout. She denies chest pain, shortness of breath, dizziness.   BMI is Body mass index is 21.9 kg/m., she is working on diet and exercise. Wt Readings from Last 3 Encounters:  07/14/19 144 lb (65.3 kg)  04/20/19  140 lb 12.8 oz (63.9 kg)  02/22/19 173 lb (78.5 kg)   She is not on cholesterol medication and denies myalgias. Her cholesterol is at goal. The cholesterol last visit was:  Lab Results  Component Value Date   CHOL 198 06/03/2018   HDL 72 06/03/2018   LDLCALC 108 (H) 06/03/2018   TRIG 85 06/03/2018   CHOLHDL 2.8 06/03/2018  . She has been working on diet and exercise for prediabetes, she is not on bASA, she is not on ACE/ARB and denies foot ulcerations, hyperglycemia, hypoglycemia , increased appetite, nausea, paresthesia of the feet,  polydipsia, polyuria, visual disturbances, vomiting and weight loss. Last A1C in the office was:  Lab Results  Component Value Date   HGBA1C 5.6 06/03/2018   Patient is on Vitamin D supplement.  2800 IU Lab Results  Component Value Date   VD25OH 38 06/03/2018     She has sjogrens that is being monitored.   She does still see Dr. Nori Riis yearly.  He is handling her pap smears 12/2015, estrogen use and also her dexa scans.    Current Medications:    Current Outpatient Medications (Cardiovascular):  .  bisoprolol (ZEBETA) 5 MG tablet, Take 1 tablet at Bedtime for BP   Current Outpatient Medications (Analgesics):  .  acetaminophen (TYLENOL) 500 MG tablet, Take 1,000 mg by mouth every 6 (six) hours as needed for moderate pain or headache. .  ibuprofen (ADVIL) 800 MG tablet, Take 800 mg by mouth every 6 (six) hours as needed for moderate pain.   Current Outpatient Medications (Other):  .  amitriptyline (ELAVIL) 50 MG tablet, Take 1 tablet at Bedtime as needed for Sleep .  B Complex-C (SUPER B COMPLEX PO), Take 1 Dose by mouth 2 (two) times daily.  .  Calcium Carbonate-Vitamin D (CALTRATE 600+D PO), Take 1 tablet by mouth 2 (two) times daily. 630 mg calcium and 500 IU Vitamin D .  Cholecalciferol (VITAMIN D) 2000 UNITS tablet, Take 2,000 Units by mouth daily. Marland Kitchen  ESTRADIOL VA, Place vaginally. .  Magnesium 400 MG TABS, Take 400 mg by mouth daily.  .  Omega 3-6-9 Fatty Acids (OMEGA 3-6-9 COMPLEX PO), Take 1 tablet by mouth daily with lunch.  .  Probiotic Product (PROBIOTIC DAILY PO), Take by mouth.  Health Maintenance:   Immunization History  Administered Date(s) Administered  . Influenza, High Dose Seasonal PF 02/10/2019  . Influenza, Seasonal, Injecte, Preservative Fre 05/26/2016  . Influenza-Unspecified 01/25/2013, 02/09/2014  . PPD Test 04/19/2013, 05/17/2014  . Pneumococcal Polysaccharide-23 04/19/2013  . Tdap 08/20/2010    Tetanus: 2012 Pneumovax:  2014 Flu vaccine:  2020 Pap: Dr. Nori Riis MGM: 07/2018 rescheduled due to recent COVID vaccine DEXA:2014 OVERDUE- rescheduled with the MGM Colonoscopy: 06/2019 US carotid 12/2018--WAS NEAR NORMAL- previous in 2019 showed right ICA 60-79%, left 1-39%.  MRA head for pulsatile tinnitus negative Last Dental Exam:  Twice yearly visits Last Eye Exam: Dr. [place] 2018  Patient Care Team: Unk Pinto, MD as PCP - General (Internal Medicine) [place], Heather, [place] (Optometry) Maisie Fus, MD as Consulting Physician (Obstetrics and Gynecology) Wilford Corner, MD as Consulting Physician (Gastroenterology) Sanda Klein, MD as Consulting Physician (Cardiology) [place], Amy, MD as Consulting Physician (Dermatology) Valinda Party, MD as Consulting Physician (Rheumatology) Rozetta Nunnery, MD as Consulting Physician (Otolaryngology)  Medical History:  Past Medical History:  Diagnosis Date  . Anemia   . Anxiety    situational  . Arthritis   . Complication of anesthesia   .  Depression   . Family history of adverse reaction to anesthesia    Mother - nausea, daughter nausea  . GERD (gastroesophageal reflux disease)   . Heart murmur   . Migraines   . Osteopenia   . PONV (postoperative nausea and vomiting) 1976, 2001   Nausea   . Sjogren's disease ([place])   . Unspecified vitamin D deficiency   . VSD (ventricular septal defect), perimembranous    Allergies Allergies  Allergen Reactions  . Estradiol     Patch caused itching     SURGICAL HISTORY She  has a past surgical history that includes Esophagogastroduodenoscopy (01/28/13); Dilation and curettage of uterus; Ablation on endometriosis; cervical conization (1976); lip biopsy; Colonoscopy (2012); and ORIF ankle fracture (Left, 11/17/2018).   FAMILY HISTORY Her family history includes ALS in an other family member; Bladder Cancer in her father; Breast cancer in her cousin; Hypertension in her father and mother; Multiple sclerosis in her cousin; Ovarian  cancer in her maternal aunt; Parkinson's disease in her cousin; Stroke in her father and mother; Uterine cancer in her maternal grandmother.   SOCIAL HISTORY She  reports that she has never smoked. She has never used smokeless tobacco. She reports current alcohol use. She reports that she does not use drugs.   Review of Systems: Review of Systems  Constitutional: Negative for chills, fever and malaise/fatigue.  HENT: Positive for ear pain and tinnitus. Negative for congestion, ear discharge, hearing loss, nosebleeds, sinus pain and sore throat.   Eyes: Negative.        Dry eye  Respiratory: Negative for cough, shortness of breath, wheezing and stridor.   Cardiovascular: Negative for chest pain, palpitations and leg swelling.  Gastrointestinal: Negative for abdominal pain, blood in stool, constipation, diarrhea, heartburn and melena.  Genitourinary: Negative.   Musculoskeletal: Negative.   Skin: Negative.   Neurological: Positive for dizziness and headaches. Negative for tingling, tremors, sensory change, speech change, focal weakness, seizures and loss of consciousness.  Psychiatric/Behavioral: Positive for depression. The patient is not nervous/anxious and does not have insomnia.     Physical Exam: Estimated body mass index is 21.9 kg/m as calculated from the following:   Height as of this encounter: 5' 8"  (1.727 m).   Weight as of this encounter: 144 lb (65.3 kg). BP 108/70   Pulse 78   Temp 97.7 F (36.5 C)   Ht 5' 8"  (1.727 m)   Wt 144 lb (65.3 kg)   SpO2 96%   BMI 21.90 kg/m   General Appearance: Well nourished well developed, in no apparent distress.  Eyes: PERRLA, EOMs, conjunctiva no swelling or erythema ENT/Mouth: Ear canals normal without obstruction, swelling, erythema, or discharge.  TMs normal left ear with no erythema, bulging, retraction, or loss of landmarks but right ear with hole in TM at 10 oclock, no pus or discharge.  Oropharynx moist and clear with no  exudate, erythema, or swelling.  Neck: Supple, thyroid normal. No bruits.  No cervical adenopathy Respiratory: Respiratory effort normal, Breath sounds clear A&P without wheeze, rhonchi, rales.   Cardio: RRR with holosystolic low pitched murmur heard best on the L 3rd ICS, no rubs or gallops. Brisk peripheral pulses without edema.  Chest: symmetric, with normal excursions Breasts: Deferred to obgyn.  Abdomen: Soft, nontender, no guarding, rebound, hernias, masses, or organomegaly.  Lymphatics: Non tender without lymphadenopathy. Musculoskeletal: Full ROM all peripheral extremities,5/5 strength, and normal gait.  Skin: Warm, dry without rashes, lesions, ecchymosis. Neuro: Awake and oriented X 3, Cranial  nerves intact, reflexes equal bilaterally. Normal muscle tone, no cerebellar symptoms. Sensation intact.  Psych:  normal affect, Insight and Judgment appropriate.   EKG: recently had normal in Dec, won't get today  Over 40 minutes of exam, counseling, chart review and critical decision making was performed  Vicie Mutters 9:03 AM [facility]

## 2019-07-14 ENCOUNTER — Other Ambulatory Visit: Payer: Self-pay

## 2019-07-14 ENCOUNTER — Encounter: Payer: Self-pay | Admitting: Physician Assistant

## 2019-07-14 ENCOUNTER — Ambulatory Visit (INDEPENDENT_AMBULATORY_CARE_PROVIDER_SITE_OTHER): Payer: Medicare Other | Admitting: Physician Assistant

## 2019-07-14 VITALS — BP 108/70 | HR 78 | Temp 97.7°F | Ht 68.0 in | Wt 144.0 lb

## 2019-07-14 DIAGNOSIS — E782 Mixed hyperlipidemia: Secondary | ICD-10-CM | POA: Diagnosis not present

## 2019-07-14 DIAGNOSIS — N814 Uterovaginal prolapse, unspecified: Secondary | ICD-10-CM

## 2019-07-14 DIAGNOSIS — I6529 Occlusion and stenosis of unspecified carotid artery: Secondary | ICD-10-CM

## 2019-07-14 DIAGNOSIS — R309 Painful micturition, unspecified: Secondary | ICD-10-CM

## 2019-07-14 DIAGNOSIS — R030 Elevated blood-pressure reading, without diagnosis of hypertension: Secondary | ICD-10-CM

## 2019-07-14 DIAGNOSIS — Z79899 Other long term (current) drug therapy: Secondary | ICD-10-CM

## 2019-07-14 DIAGNOSIS — G43809 Other migraine, not intractable, without status migrainosus: Secondary | ICD-10-CM

## 2019-07-14 DIAGNOSIS — Z0001 Encounter for general adult medical examination with abnormal findings: Secondary | ICD-10-CM

## 2019-07-14 DIAGNOSIS — Q210 Ventricular septal defect: Secondary | ICD-10-CM | POA: Diagnosis not present

## 2019-07-14 DIAGNOSIS — M3500 Sicca syndrome, unspecified: Secondary | ICD-10-CM

## 2019-07-14 DIAGNOSIS — E559 Vitamin D deficiency, unspecified: Secondary | ICD-10-CM

## 2019-07-14 DIAGNOSIS — E611 Iron deficiency: Secondary | ICD-10-CM

## 2019-07-14 DIAGNOSIS — M8580 Other specified disorders of bone density and structure, unspecified site: Secondary | ICD-10-CM

## 2019-07-14 DIAGNOSIS — R7309 Other abnormal glucose: Secondary | ICD-10-CM

## 2019-07-14 DIAGNOSIS — F419 Anxiety disorder, unspecified: Secondary | ICD-10-CM

## 2019-07-14 MED ORDER — PROPRANOLOL HCL 10 MG PO TABS: 10.0000 mg | ORAL_TABLET | Freq: Three times a day (TID) | ORAL | 1 refills | Status: DC | PRN

## 2019-07-14 NOTE — Patient Instructions (Signed)
Can take the propranolol as needed up to 3 x a day BUT if you ever think you need something daily PLEASE come back sooner.   Counseling services   I suggest calling your insurance and finding out who is in your network and THEN calling those people or looking them up on google.   I'm a big fan of Cognitive Behavioral Therapy, look this up on You tube or check with the therapist you see if they are certified.  This form of therapy helps to teach you skills to better handle with current situation that are causing anxiety or depression.   There are some great apps too Check out [employer], give thanks app.  Meditations apps are great like headspace.    Managing Anxiety, Adult After being diagnosed with an anxiety disorder, you may be relieved to know why you have felt or behaved a certain way. You may also feel overwhelmed about the treatment ahead and what it will mean for your life. With care and support, you can manage this condition and recover from it. How to manage lifestyle changes Managing stress and anxiety  Stress is your body's reaction to life changes and events, both good and bad. Most stress will last just a few hours, but stress can be ongoing and can lead to more than just stress. Although stress can play a major role in anxiety, it is not the same as anxiety. Stress is usually caused by something external, such as a deadline, test, or competition. Stress normally passes after the triggering event has ended.  Anxiety is caused by something internal, such as imagining a terrible outcome or worrying that something will go wrong that will devastate you. Anxiety often does not go away even after the triggering event is over, and it can become long-term (chronic) worry. It is important to understand the differences between stress and anxiety and to manage your stress effectively so that it does not lead to an anxious response. Talk with your health care provider or a counselor to learn more  about reducing anxiety and stress. He or she may suggest tension reduction techniques, such as:  Music therapy. This can include creating or listening to music that you enjoy and that inspires you.  Mindfulness-based meditation. This involves being aware of your normal breaths while not trying to control your breathing. It can be done while sitting or walking.  Centering prayer. This involves focusing on a word, phrase, or sacred image that means something to you and brings you peace.  Deep breathing. To do this, expand your stomach and inhale slowly through your nose. Hold your breath for 3-5 seconds. Then exhale slowly, letting your stomach muscles relax.  Self-talk. This involves identifying thought patterns that lead to anxiety reactions and changing those patterns.  Muscle relaxation. This involves tensing muscles and then relaxing them. Choose a tension reduction technique that suits your lifestyle and personality. These techniques take time and practice. Set aside 5-15 minutes a day to do them. Therapists can offer counseling and training in these techniques. The training to help with anxiety may be covered by some insurance plans. Other things you can do to manage stress and anxiety include:  Keeping a stress/anxiety diary. This can help you learn what triggers your reaction and then learn ways to manage your response.  Thinking about how you react to certain situations. You may not be able to control everything, but you can control your response.  Making time for activities that help you  relax and not feeling guilty about spending your time in this way.  Visual imagery and yoga can help you stay calm and relax.  Medicines Medicines can help ease symptoms. Medicines for anxiety include:  Anti-anxiety drugs.  Antidepressants. Medicines are often used as a primary treatment for anxiety disorder. Medicines will be prescribed by a health care provider. When used together, medicines,  psychotherapy, and tension reduction techniques may be the most effective treatment. Relationships Relationships can play a big part in helping you recover. Try to spend more time connecting with trusted friends and family members. Consider going to couples counseling, taking family education classes, or going to family therapy. Therapy can help you and others better understand your condition. How to recognize changes in your anxiety Everyone responds differently to treatment for anxiety. Recovery from anxiety happens when symptoms decrease and stop interfering with your daily activities at home or work. This may mean that you will start to:  Have better concentration and focus. Worry will interfere less in your daily thinking.  Sleep better.  Be less irritable.  Have more energy.  Have improved memory. It is important to recognize when your condition is getting worse. Contact your health care provider if your symptoms interfere with home or work and you feel like your condition is not improving. Follow these instructions at home: Activity  Exercise. Most adults should do the following: ? Exercise for at least 150 minutes each week. The exercise should increase your heart rate and make you sweat (moderate-intensity exercise). ? Strengthening exercises at least twice a week.  Get the right amount and quality of sleep. Most adults need 7-9 hours of sleep each night. Lifestyle   Eat a healthy diet that includes plenty of vegetables, fruits, whole grains, low-fat dairy products, and lean protein. Do not eat a lot of foods that are high in solid fats, added sugars, or salt.  Make choices that simplify your life.  Do not use any products that contain nicotine or tobacco, such as cigarettes, e-cigarettes, and chewing tobacco. If you need help quitting, ask your health care provider.  Avoid caffeine, alcohol, and certain over-the-counter cold medicines. These may make you feel worse. Ask  your pharmacist which medicines to avoid. General instructions  Take over-the-counter and prescription medicines only as told by your health care provider.  Keep all follow-up visits as told by your health care provider. This is important. Where to find support You can get help and support from these sources:  Self-help groups.  Online and [employer].  A trusted spiritual leader.  Couples counseling.  Family education classes.  Family therapy. Where to find more information You may find that joining a support group helps you deal with your anxiety. The following sources can help you locate counselors or support groups near you:  Mental Health America: www.mentalhealthamerica.net  Anxiety and Depression Association of [place] (ADAA): ProgramCam.de  [employer] on Mental Illness (NAMI): www.nami.org Contact a health care provider if you:  Have a hard time staying focused or finishing daily tasks.  Spend many hours a day feeling worried about everyday life.  Become exhausted by worry.  Start to have headaches, feel tense, or have nausea.  Urinate more than normal.  Have diarrhea. Get help right away if you have:  A racing heart and shortness of breath.  Thoughts of hurting yourself or others. If you ever feel like you may hurt yourself or others, or have thoughts about taking your own life, get help right  away. You can go to your nearest emergency department or call:  Your local emergency services (911 in the [place]  A suicide crisis helpline, such as the [place] at 469 105 1040. This is open 24 hours a day. Summary  Taking steps to learn and use tension reduction techniques can help calm you and help prevent triggering an anxiety reaction.  When used together, medicines, psychotherapy, and tension reduction techniques may be the most effective treatment.  Family, friends, and partners can play a big part in  helping you recover from an anxiety disorder. This information is not intended to replace advice given to you by your health care provider. Make sure you discuss any questions you have with your health care provider. Document Revised: 09/28/2018 Document Reviewed: 09/28/2018 Elsevier Patient Education  [place].

## 2019-07-15 LAB — URINALYSIS, ROUTINE W REFLEX MICROSCOPIC
Bacteria, UA: NONE SEEN /HPF
Bilirubin Urine: NEGATIVE
Glucose, UA: NEGATIVE
Hgb urine dipstick: NEGATIVE
Hyaline Cast: NONE SEEN /LPF
Ketones, ur: NEGATIVE
Nitrite: NEGATIVE
Protein, ur: NEGATIVE
RBC / HPF: NONE SEEN /HPF (ref 0–2)
Specific Gravity, Urine: 1.009 (ref 1.001–1.03)
pH: 6.5 (ref 5.0–8.0)

## 2019-07-15 LAB — COMPLETE METABOLIC PANEL WITH GFR
AG Ratio: 2 (calc) (ref 1.0–2.5)
ALT: 14 U/L (ref 6–29)
AST: 16 U/L (ref 10–35)
Albumin: 4.3 g/dL (ref 3.6–5.1)
Alkaline phosphatase (APISO): 61 U/L (ref 37–153)
BUN: 7 mg/dL (ref 7–25)
CO2: 28 mmol/L (ref 20–32)
Calcium: 9.8 mg/dL (ref 8.6–10.4)
Chloride: 100 mmol/L (ref 98–110)
Creat: 0.57 mg/dL (ref 0.50–0.99)
GFR, Est African American: 113 mL/min/{1.73_m2} (ref >=60)
GFR, Est Non African American: 97 mL/min/{1.73_m2} (ref >=60)
Globulin: 2.2 g/dL (calc) (ref 1.9–3.7)
Glucose, Bld: 87 mg/dL (ref 65–99)
Potassium: 4.5 mmol/L (ref 3.5–5.3)
Sodium: 135 mmol/L (ref 135–146)
Total Bilirubin: 0.6 mg/dL (ref 0.2–1.2)
Total Protein: 6.5 g/dL (ref 6.1–8.1)

## 2019-07-15 LAB — LIPID PANEL
Cholesterol: 171 mg/dL (ref <200)
HDL: 64 mg/dL (ref >=50)
LDL Cholesterol (Calc): 91 mg/dL (calc)
Non-HDL Cholesterol (Calc): 107 mg/dL (calc) (ref <130)
Total CHOL/HDL Ratio: 2.7 (calc) (ref <5.0)
Triglycerides: 69 mg/dL (ref <150)

## 2019-07-15 LAB — MICROALBUMIN / CREATININE URINE RATIO
Creatinine, Urine: 47 mg/dL (ref 20–275)
Microalb, Ur: <0.2 mg/dL

## 2019-07-15 LAB — HEMOGLOBIN A1C
Hgb A1c MFr Bld: 5.3 % of total Hgb (ref <5.7)
Mean Plasma Glucose: 105 (calc)
eAG (mmol/L): 5.8 (calc)

## 2019-07-15 LAB — CBC WITH DIFFERENTIAL/PLATELET
Absolute Monocytes: 653 cells/uL (ref 200–950)
Basophils Absolute: 61 cells/uL (ref 0–200)
Basophils Relative: 0.9 %
Eosinophils Absolute: 129 cells/uL (ref 15–500)
Eosinophils Relative: 1.9 %
HCT: 34.6 % — ABNORMAL LOW (ref 35.0–45.0)
Hemoglobin: 11.7 g/dL (ref 11.7–15.5)
Lymphs Abs: 2169 cells/uL (ref 850–3900)
MCH: 30.5 pg (ref 27.0–33.0)
MCHC: 33.8 g/dL (ref 32.0–36.0)
MCV: 90.3 fL (ref 80.0–100.0)
MPV: 9.2 fL (ref 7.5–12.5)
Monocytes Relative: 9.6 %
Neutro Abs: 3788 cells/uL (ref 1500–7800)
Neutrophils Relative %: 55.7 %
Platelets: 318 10*3/uL (ref 140–400)
RBC: 3.83 10*6/uL (ref 3.80–5.10)
RDW: 12.1 % (ref 11.0–15.0)
Total Lymphocyte: 31.9 %
WBC: 6.8 10*3/uL (ref 3.8–10.8)

## 2019-07-15 LAB — TSH: TSH: 2.08 mIU/L (ref 0.40–4.50)

## 2019-07-15 LAB — VITAMIN D 25 HYDROXY (VIT D DEFICIENCY, FRACTURES): Vit D, 25-Hydroxy: 34 ng/mL (ref 30–100)

## 2019-07-15 LAB — IRON, TOTAL/TOTAL IRON BINDING CAP
%SAT: 29 % (calc) (ref 16–45)
Iron: 80 ug/dL (ref 45–160)
TIBC: 275 mcg/dL (calc) (ref 250–450)

## 2019-07-15 LAB — FERRITIN: Ferritin: 134 ng/mL (ref 16–288)

## 2019-07-15 LAB — MAGNESIUM: Magnesium: 2 mg/dL (ref 1.5–2.5)

## 2019-08-11 ENCOUNTER — Telehealth: Payer: Self-pay | Admitting: Radiology

## 2019-08-11 MED ORDER — GABAPENTIN 300 MG PO CAPS: 300.0000 mg | ORAL_CAPSULE | Freq: Every day | ORAL | 3 refills | Status: DC

## 2019-08-11 NOTE — Addendum Note (Signed)
Addended by: Rogers Seeds on: 08/11/2019 04:54 PM   Modules accepted: Orders

## 2019-08-11 NOTE — Telephone Encounter (Signed)
Sent to pharmacy 

## 2019-08-11 NOTE — Telephone Encounter (Signed)
Fax received from pharmacy requesting refill on Gabapentin 300mg  capsule with 3 refills. Requests 90 day supply.  Erin had written script for Gabapentin 300mg  capsule 1 po q hs on 02/01/2020.  Please advise. OK for refills?

## 2019-08-11 NOTE — Telephone Encounter (Signed)
Okay to refill as requested by pharmacy.

## 2019-09-01 ENCOUNTER — Encounter: Payer: Self-pay | Admitting: Internal Medicine

## 2019-09-05 LAB — HM PAP SMEAR: HM Pap smear: ABNORMAL

## 2019-09-05 LAB — HM MAMMOGRAPHY: HM Mammogram: NORMAL (ref 0–4)

## 2019-09-05 LAB — HM DEXA SCAN

## 2019-10-19 NOTE — Progress Notes (Signed)
WELCOME TO MEDICARE WELLNESS  Assessment and Plan:  Mixed hyperlipidemia -continue medications, check lipids, decrease fatty foods, increase activity.  -     Lipid panel  Elevated blood pressure reading without diagnosis of hypertension - continue medications, DASH diet, exercise and monitor at home. Call if greater than 130/80.  -     CBC with Differential/Platelet -     BASIC METABOLIC PANEL WITH GFR -     Hepatic function panel -     TSH -     Urinalysis, Routine w reflex microscopic -     Microalbumin / creatinine urine ratio -     EKG 12-Lead - aorta scan  ABNORMAL GLUCOSE Discussed disease progression and risks Discussed diet/exercise, weight management and risk modification  Vitamin D deficiency -     VITAMIN D 25 Hydroxy (Vit-D Deficiency, Fractures)  Sjogren's syndrome, with unspecified organ involvement ([place]) Continue follow up  Other migraine without status migrainosus, not intractable Avoid triggers  VSD (ventricular septal defect), perimembranous Monitoring  Rectocele, female Monitoring  Osteopenia, unspecified location 2020- REPEAT 2 YEARS  Cystocele with prolapse Monitoring  Anxiety Will do propranolol as needed for anxiety If any worsening depression/anxiety will add on lexapro  Depression, major, recurrent, in partial remission ([place]) Will do propranolol as needed for anxiety If any worsening depression/anxiety will add on lexapro  Medication management -     Magnesium  Iron def Check labs  Discussed med's effects and SE's. Screening labs and tests as requested with regular follow-up as recommended.  Future Appointments  Date Time Provider [place]  07/13/2020  9:30 AM Vicie Mutters, PA-C [facility] None    Medicare Attestation I have personally reviewed: The patient's medical and social history Their use of alcohol, tobacco or illicit drugs Their current medications and supplements The patient's functional ability  including ADLs,fall risks, home safety risks, cognitive, and hearing and visual impairment Diet and physical activities Evidence for depression or mood disorders  The patient's weight, height, BMI, and visual acuity have been recorded in the chart.  I have made referrals, counseling, and provided education to the patient based on review of the above and I have provided the patient with a written personalized care plan for preventive services.    MEDICARE WELLNESS OBJECTIVES: Physical activity: Current Exercise Habits: Home exercise routine, Type of exercise: walking, Time (Minutes): 60, Frequency (Times/Week): 5, Weekly Exercise (Minutes/Week): 300, Intensity: Moderate Cardiac risk factors: Cardiac Risk Factors include: advanced age (>36mn, >>66women);dyslipidemia;hypertension Depression/mood screen:   [facility]  Decreased Interest 0  Down, Depressed, Hopeless 0  PHQ - 2 Score 0  Altered sleeping 0  Tired, decreased energy 0  Change in appetite 0  Feeling bad or failure about yourself  0  Trouble concentrating 0  Moving slowly or fidgety/restless 0  Suicidal thoughts 0  PHQ-9 Score 0  Difficult doing work/chores Not difficult at all    ADLs:  In your present state of health, do you have any difficulty performing the following activities: 10/20/2019 11/17/2018  Hearing? [place] N -  Difficulty concentrating or making decisions? N -  Walking or climbing stairs? N -  Dressing or bathing? N -  Doing errands, shopping? N N  Comment has started back since breaking her ankle -  Some recent data might be hidden     Cognitive Testing  Alert? Yes  Normal Appearance?Yes  Oriented to person? Yes  Place? Yes   Time? Yes  Recall of  three objects?  Yes  Can perform simple calculations? Yes  Displays appropriate judgment?Yes  Can read the correct time from a watch face?Yes  EOL planning: Does Patient Have a Medical Advance Directive?: No Would patient like  information on creating a medical advance directive?: Yes (MAU/Ambulatory/Procedural Areas - Information given)     HPI  64 y.o. married WF  presents for a welcome to medicare  Patient was seen for palpitations in Dec 2020 after the loss of her mom, cat, and broke her ankle she had a normal EKG, has propranolol 10 mg AS needed for palpitations, does not take often.  She was also having uretheral spasm, was put on low dose of amitriptyline for sleep/urthera pain and estrogen cream, now off both of these and states she is doing better.   She had weight loss and diarrhea with the stress in Dec 2020.  MGM, normal CXR, and normal CT AB. She had an elevated amylase, negative HIV, ESR, CRP, Hepatitis.  Saw Dr. Michail Sermon had normal colonoscopy 09/2019.  Her weight is back up, she is starting to walk.  Lab Results  Component Value Date   IRON 80 07/14/2019   TIBC 275 07/14/2019   FERRITIN 134 07/14/2019   BMI is Body mass index is 24.78 kg/m., she is working on diet and exercise. Wt Readings from Last 3 Encounters:  10/20/19 163 lb (73.9 kg)  07/14/19 144 lb (65.3 kg)  04/20/19 140 lb 12.8 oz (63.9 kg)   Her blood pressure has been controlled at home, at home it is normal, today their BP is BP: 120/80.  She does workout, walking 4-7 days a week, 2-4 miles a day, about an hour   She denies chest pain, shortness of breath, dizziness.   She is not on cholesterol medication and denies myalgias. Her cholesterol is at goal. The cholesterol last visit was:  Lab Results  Component Value Date   CHOL 171 07/14/2019   HDL 64 07/14/2019   LDLCALC 91 07/14/2019   TRIG 69 07/14/2019   CHOLHDL 2.7 07/14/2019  . She has been working on diet and exercise for prediabetes, she is not on bASA, she is not on ACE/ARB and denies foot ulcerations, hyperglycemia, hypoglycemia , increased appetite, nausea, paresthesia of the feet, polydipsia, polyuria, visual disturbances, vomiting and weight loss. Last A1C in  the office was:  Lab Results  Component Value Date   HGBA1C 5.3 07/14/2019   Patient is on Vitamin D supplement.  5000 IU Lab Results  Component Value Date   VD25OH 34 07/14/2019     She has sjogrens that is being monitored.   She does still see Dr. Nori Riis yearly.  He is handling her pap smears 12/2015, estrogen use and also her dexa scans.    Current Medications:    Current Outpatient Medications (Cardiovascular):  .  propranolol (INDERAL) 10 MG tablet, Take 1 tablet (10 mg total) by mouth 3 (three) times daily as needed (anxiety and palpitations).   Current Outpatient Medications (Analgesics):  .  acetaminophen (TYLENOL) 500 MG tablet, Take 1,000 mg by mouth every 6 (six) hours as needed for moderate pain or headache. .  ibuprofen (ADVIL) 800 MG tablet, Take 800 mg by mouth every 6 (six) hours as needed for moderate pain.   Current Outpatient Medications (Other):  Marland Kitchen  B Complex-C (SUPER B COMPLEX PO), Take 1 Dose by mouth 2 (two) times daily.  .  Calcium Carbonate-Vitamin D (CALTRATE 600+D PO), Take 1 tablet by mouth 2 (  two) times daily. 630 mg calcium and 500 IU Vitamin D .  Cholecalciferol (VITAMIN D) 2000 UNITS tablet, Take 2,000 Units by mouth daily. Marland Kitchen  ESTRADIOL VA, Place vaginally. .  Magnesium 400 MG TABS, Take 400 mg by mouth daily.  .  Omega 3-6-9 Fatty Acids (OMEGA 3-6-9 COMPLEX PO), Take 1 tablet by mouth daily with lunch.  .  Probiotic Product (PROBIOTIC DAILY PO), Take by mouth.  Health Maintenance:   Immunization History  Administered Date(s) Administered  . Influenza, High Dose Seasonal PF 02/10/2019  . Influenza, Seasonal, Injecte, Preservative Fre 05/26/2016  . Influenza-Unspecified 01/25/2013, 02/09/2014  . PFIZER SARS-COV-2 Vaccination 07/04/2019, 07/25/2019  . PPD Test 04/19/2013, 05/17/2014  . Pneumococcal Polysaccharide-23 04/19/2013  . Tdap 08/20/2010   Health Maintenance  Topic Date Due  . PNA vac Low Risk Adult (1 of 2 - PCV13) 02/07/2019  .  INFLUENZA VACCINE  12/11/2019  . TETANUS/TDAP  08/19/2020  . MAMMOGRAM  09/04/2021  . PAP SMEAR-Modifier  09/05/2022  . COLONOSCOPY  07/11/2029  . DEXA SCAN  Completed  . COVID-19 Vaccine  Completed  . Hepatitis C Screening  Completed  . HIV Screening  Completed   Pap: 09/05/2019 atypical cells MGM: 09/05/2019 rescheduled due to recent COVID vaccine DEXA:09/05/2019 osteoporosis Colonoscopy: 07/2019 US carotid 12/2018--WAS NEAR NORMAL- previous in 2019 showed right ICA 60-79%, left 1-39%.  MRA head for pulsatile tinnitus negative Last Dental Exam:  Twice yearly visits Dr. Pamelia Hoit Last Eye Exam: Dr. [place] 2021  Patient Care Team: Unk Pinto, MD as PCP - General (Internal Medicine) [place], Heather, [place] (Optometry) Maisie Fus, MD as Consulting Physician (Obstetrics and Gynecology) Wilford Corner, MD as Consulting Physician (Gastroenterology) Sanda Klein, MD as Consulting Physician (Cardiology) [place], Amy, MD as Consulting Physician (Dermatology) Valinda Party, MD as Consulting Physician (Rheumatology) Rozetta Nunnery, MD as Consulting Physician (Otolaryngology)  Medical History:  Past Medical History:  Diagnosis Date  . Anemia   . Anxiety    situational  . Arthritis   . Complication of anesthesia   . Depression   . Family history of adverse reaction to anesthesia    Mother - nausea, daughter nausea  . GERD (gastroesophageal reflux disease)   . Heart murmur   . Migraines   . Osteopenia   . PONV (postoperative nausea and vomiting) 1976, 2001   Nausea   . Sjogren's disease ([place])   . Unspecified vitamin D deficiency   . VSD (ventricular septal defect), perimembranous    Allergies Allergies  Allergen Reactions  . Estradiol     Patch caused itching     SURGICAL HISTORY She  has a past surgical history that includes Esophagogastroduodenoscopy (01/28/13); Dilation and curettage of uterus; Ablation on endometriosis; cervical conization (1976); lip  biopsy; Colonoscopy (2012); and ORIF ankle fracture (Left, 11/17/2018).   FAMILY HISTORY Her family history includes ALS in an other family member; Bladder Cancer in her father; Breast cancer in her cousin; Hypertension in her father and mother; Multiple sclerosis in her cousin; Ovarian cancer in her maternal aunt; Parkinson's disease in her cousin; Stroke in her father and mother; Uterine cancer in her maternal grandmother.   SOCIAL HISTORY She  reports that she has never smoked. She has never used smokeless tobacco. She reports current alcohol use. She reports that she does not use drugs.   Review of Systems: Review of Systems  Constitutional: Negative for chills, fever and malaise/fatigue.  HENT: Positive for ear pain (right ear ) and tinnitus. Negative for congestion,  ear discharge, hearing loss, nosebleeds, sinus pain and sore throat.   Eyes: Negative.        Dry eye  Respiratory: Negative for cough, shortness of breath, wheezing and stridor.   Cardiovascular: Negative for chest pain, palpitations and leg swelling.  Gastrointestinal: Negative for abdominal pain, blood in stool, constipation, diarrhea, heartburn and melena.  Genitourinary: Negative.   Musculoskeletal: Negative.   Skin: Negative.   Neurological: Negative for dizziness, tingling, tremors, sensory change, speech change, focal weakness, seizures, loss of consciousness and headaches.  Psychiatric/Behavioral: Negative for depression. The patient is not nervous/anxious and does not have insomnia.     Physical Exam: Estimated body mass index is 24.78 kg/m as calculated from the following:   Height as of this encounter: _0  (1.727 m).   Weight as of this encounter: 163 lb (73.9 kg). BP 120/80   Pulse 73   Temp (!) 97.3 F (36.3 C)   Ht _1  (1.727 m)   Wt 163 lb (73.9 kg)   SpO2 99%   BMI 24.78 kg/m   General Appearance: Well nourished well developed, in no apparent distress.  Eyes: PERRLA, EOMs, conjunctiva no  swelling or erythema ENT/Mouth: Ear canals normal without obstruction, swelling, erythema, or discharge.  TMs normal left ear with no erythema, bulging, retraction, or loss of landmarks but right ear with hole in TM at 10 oclock, no pus or discharge.  Oropharynx moist and clear with no exudate, erythema, or swelling.  Neck: Supple, thyroid normal. No bruits.  No cervical adenopathy Respiratory: Respiratory effort normal, Breath sounds clear A&P without wheeze, rhonchi, rales.   Cardio: RRR with holosystolic low pitched murmur heard best on the L 3rd ICS, no rubs or gallops. Brisk peripheral pulses without edema.  Chest: symmetric, with normal excursions Breasts: Deferred to obgyn.  Abdomen: Soft, nontender, no guarding, rebound, hernias, masses, or organomegaly.  Lymphatics: Non tender without lymphadenopathy. Musculoskeletal: Full ROM all peripheral extremities,5/5 strength, and normal gait.  Skin: Warm, dry without rashes, lesions, ecchymosis. Neuro: Awake and oriented X 3, Cranial nerves intact, reflexes equal bilaterally. Normal muscle tone, no cerebellar symptoms. Sensation intact.  Psych:  normal affect, Insight and Judgment appropriate.   EKG: WNL, no ST changes Aorta Scan: WNL  Over 40 minutes of exam, counseling, chart review and critical decision making was performed  Vicie Mutters 2:27 PM [facility]

## 2019-10-20 ENCOUNTER — Other Ambulatory Visit: Payer: Self-pay

## 2019-10-20 ENCOUNTER — Encounter: Payer: Self-pay | Admitting: Physician Assistant

## 2019-10-20 ENCOUNTER — Ambulatory Visit (INDEPENDENT_AMBULATORY_CARE_PROVIDER_SITE_OTHER): Payer: Medicare Other | Admitting: Physician Assistant

## 2019-10-20 VITALS — BP 120/80 | HR 73 | Temp 97.3°F | Ht 68.0 in | Wt 163.0 lb

## 2019-10-20 DIAGNOSIS — Q210 Ventricular septal defect: Secondary | ICD-10-CM

## 2019-10-20 DIAGNOSIS — M3500 Sicca syndrome, unspecified: Secondary | ICD-10-CM

## 2019-10-20 DIAGNOSIS — I6529 Occlusion and stenosis of unspecified carotid artery: Secondary | ICD-10-CM | POA: Diagnosis not present

## 2019-10-20 DIAGNOSIS — Z0000 Encounter for general adult medical examination without abnormal findings: Secondary | ICD-10-CM

## 2019-10-20 DIAGNOSIS — Z136 Encounter for screening for cardiovascular disorders: Secondary | ICD-10-CM

## 2019-10-20 DIAGNOSIS — E611 Iron deficiency: Secondary | ICD-10-CM

## 2019-10-20 DIAGNOSIS — M8580 Other specified disorders of bone density and structure, unspecified site: Secondary | ICD-10-CM

## 2019-10-20 DIAGNOSIS — R030 Elevated blood-pressure reading, without diagnosis of hypertension: Secondary | ICD-10-CM | POA: Diagnosis not present

## 2019-10-20 DIAGNOSIS — F419 Anxiety disorder, unspecified: Secondary | ICD-10-CM

## 2019-10-20 DIAGNOSIS — R6889 Other general symptoms and signs: Secondary | ICD-10-CM | POA: Diagnosis not present

## 2019-10-20 DIAGNOSIS — Z0001 Encounter for general adult medical examination with abnormal findings: Secondary | ICD-10-CM

## 2019-10-20 DIAGNOSIS — Z23 Encounter for immunization: Secondary | ICD-10-CM

## 2019-10-20 DIAGNOSIS — E782 Mixed hyperlipidemia: Secondary | ICD-10-CM

## 2019-10-20 DIAGNOSIS — N814 Uterovaginal prolapse, unspecified: Secondary | ICD-10-CM

## 2019-10-20 DIAGNOSIS — E559 Vitamin D deficiency, unspecified: Secondary | ICD-10-CM

## 2019-10-20 DIAGNOSIS — G43809 Other migraine, not intractable, without status migrainosus: Secondary | ICD-10-CM

## 2019-10-20 DIAGNOSIS — F3341 Major depressive disorder, recurrent, in partial remission: Secondary | ICD-10-CM

## 2019-10-20 DIAGNOSIS — Z79899 Other long term (current) drug therapy: Secondary | ICD-10-CM

## 2019-10-20 DIAGNOSIS — F3342 Major depressive disorder, recurrent, in full remission: Secondary | ICD-10-CM | POA: Insufficient documentation

## 2019-10-20 DIAGNOSIS — R7309 Other abnormal glucose: Secondary | ICD-10-CM

## 2019-10-20 NOTE — Patient Instructions (Signed)
Osteoporosis  Osteoporosis is thinning and loss of density in your bones. Osteoporosis makes bones more brittle and fragile and more likely to break (fracture). Over time, osteoporosis can cause your bones to become so weak that they fracture after a minor fall. Bones in the hip, wrist, and spine are most likely to fracture due to osteoporosis. What are the causes? The exact cause of this condition is not known. What increases the risk? You may be at greater risk for osteoporosis if you:  Have a family history of the condition.  Have poor nutrition.  Use steroid medicines, such as prednisone.  Are female.  Are age 66 or older.  Smoke or have a history of smoking.  Are not physically active (are sedentary).  Are white (Caucasian) or of Asian descent.  Have a small body frame.  Take certain medicines, such as antiseizure medicines. What are the signs or symptoms? A fracture might be the first sign of osteoporosis, especially if the fracture results from a fall or injury that usually would not cause a bone to break. Other signs and symptoms include:  Pain in the neck or low back.  Stooped posture.  Loss of height. How is this diagnosed? This condition may be diagnosed based on:  Your medical history.  A physical exam.  A bone mineral density test, also called a DXA or DEXA test (dual-energy X-ray absorptiometry test). This test uses X-rays to measure the amount of minerals in your bones. How is this treated? The goal of treatment is to strengthen your bones and lower your risk for a fracture. Treatment may involve:  Making lifestyle changes, such as: ? Including foods with more calcium and vitamin D in your diet. ? Doing weight-bearing and muscle-strengthening exercises. ? Stopping tobacco use. ? Limiting alcohol intake.  Taking medicine to slow the process of bone loss or to increase bone density.  Taking daily supplements of calcium and vitamin D.  Taking  hormone replacement medicines, such as estrogen for women and testosterone for men.  Monitoring your levels of calcium and vitamin D. Follow these instructions at home:  Activity  Exercise as told by your health care provider. Ask your health care provider what exercises and activities are safe for you. You should do: ? Exercises that make you work against gravity (weight-bearing exercises), such as tai chi, yoga, or walking. ? Exercises to strengthen muscles, such as lifting weights. Lifestyle  Limit alcohol intake to no more than 1 drink a day for nonpregnant women and 2 drinks a day for men. One drink equals 12 oz of beer, 5 oz of wine, or 1 oz of hard liquor.  Do not use any products that contain nicotine or tobacco, such as cigarettes and e-cigarettes. If you need help quitting, ask your health care provider. Preventing falls  Use devices to help you move around (mobility aids) as needed, such as canes, walkers, scooters, or crutches.  Keep rooms well-lit and clutter-free.  Remove tripping hazards from walkways, including cords and throw rugs.  Install grab bars in bathrooms and safety rails on stairs.  Use rubber mats in the bathroom and other areas that are often wet or slippery.  Wear closed-toe shoes that fit well and support your feet. Wear shoes that have rubber soles or low heels.  Review your medicines with your health care provider. Some medicines can cause dizziness or changes in blood pressure, which can increase your risk of falling. General instructions  Include calcium and vitamin D in  your diet. Calcium is important for bone health, and vitamin D helps your body to absorb calcium. Good sources of calcium and vitamin D include: ? Certain fatty fish, such as salmon and tuna. ? Products that have calcium and vitamin D added to them (fortified products), such as fortified cereals. ? Egg yolks. ? Cheese. ? Liver.  Take over-the-counter and prescription medicines  only as told by your health care provider.  Keep all follow-up visits as told by your health care provider. This is important. Contact a health care provider if:  You have never been screened for osteoporosis and you are: ? A woman who is age 66 or older. ? A man who is age 70 or older. Get help right away if:  You fall or injure yourself. Summary  Osteoporosis is thinning and loss of density in your bones. This makes bones more brittle and fragile and more likely to break (fracture),even with minor falls.  The goal of treatment is to strengthen your bones and reduce your risk for a fracture.  Include calcium and vitamin D in your diet. Calcium is important for bone health, and vitamin D helps your body to absorb calcium.  Talk with your health care provider about screening for osteoporosis if you are a woman who is age 66 or older, or a man who is age 70 or older. This information is not intended to replace advice given to you by your health care provider. Make sure you discuss any questions you have with your health care provider. Document Revised: 04/10/2017 Document Reviewed: 02/20/2017 Elsevier Patient Education  2020 [employer]  

## 2020-06-07 ENCOUNTER — Encounter: Payer: BC Managed Care – PPO | Admitting: Physician Assistant

## 2020-07-12 NOTE — Progress Notes (Signed)
Complete Physical  Assessment and Plan:  Mixed hyperlipidemia -continue medications, check lipids, decrease fatty foods, increase activity.  -     Lipid panel  Elevated blood pressure reading without diagnosis of hypertension - continue medications, DASH diet, exercise and monitor at home. Call if greater than 130/80.  -     CBC with Differential/Platelet -     CMP/GFR -     TSH -     Urinalysis, Routine w reflex microscopic -     Microalbumin / creatinine urine ratio -     EKG 12-Lead  ABNORMAL GLUCOSE Discussed disease progression and risks Discussed diet/exercise, weight management and risk modification  Vitamin D deficiency -     VITAMIN D 25 Hydroxy (Vit-D Deficiency, Fractures)  Sjogren's syndrome, with unspecified organ involvement (HCC) Continue follow up Dr. Kathi Ludwig  Other migraine without status migrainosus, not intractable Avoid triggers  VSD (ventricular septal defect), perimembranous/ Murmur Monitoring; stable; denies concerning sx  Rectocele, female Monitoring; follows GYN and [facility]  Osteoporosis 08/2019 - due 2 years; Dr. Jennette Kettle and Dr. Kathi Ludwig are following Declines bisphosphonates Discussed calcium, Vit D, K2; weight bearing/pilates exercises 3 days a week  Cystocele with prolapse Monitoring  Anxiety Will do propranolol as needed for anxiety If any worsening depression/anxiety will add on lexapro  Depression, major, recurrent, in full remission (HCC) Doing well at this time Lifestyle discussed: diet/exerise, sleep hygiene, stress management, hydration  Medication management -     Magnesium  Hx of skin cancer BCC/SCC; derm following annually   Carotid stenosis ? Improved on 2020 follow up US; discussed with patient; preference to recheck [place] ordered; if recurrent/progressive consider vascular referral  R serous otitis media Otitis effusion- no infection- suggest flonase/nasonex, allergy pills, and hold nose while drinking water to  autoinflate, if drainage from ear, fever, chills, HA, nausea, call the office or go to the ER. Steroid taper sent in per patient preference.    Orders Placed This Encounter  Procedures  . Td : Tetanus/diphtheria >7yo Preservative  free  . CBC with Differential/Platelet  . COMPLETE METABOLIC PANEL WITH GFR  . Magnesium  . Lipid panel  . TSH  . Hemoglobin A1c  . VITAMIN D 25 Hydroxy (Vit-D Deficiency, Fractures)  . Microalbumin / creatinine urine ratio  . Urinalysis, Routine w reflex microscopic  . Iron, TIBC and Ferritin Panel  . EKG 12-Lead  . VAS US CAROTID     Discussed med's effects and SE's. Screening labs and tests as requested with regular follow-up as recommended.  Future Appointments  Date Time Provider Department Center  10/25/2020  3:30 PM Judd Gaudier, NP [facility] None  07/15/2021  9:00 AM Judd Gaudier, NP [facility] None     HPI  65 y.o. female presents for a complete physical. She has Sjogren's disease [facility]); VSD (ventricular septal defect), perimembranous; Osteoporosis; Vitamin D deficiency; Migraines; Rectocele, female; Cystocele with prolapse; Abnormal glucose; Hyperlipidemia; Elevated blood pressure reading without diagnosis of hypertension; Asymptomatic carotid artery stenosis; Depression, major, recurrent, in complete remission (HCC); and History of skin cancer on their problem list.  She is married, 3 children, no grandchildren. She is retired from part time work, mostly Psychologist, occupational.   She does still see Dr. Jennette Kettle GYN yearly. He is handling her pap smears, last 08/2019 per patient, was abnormal and plans to repeat this year, topical estrogen use and also her dexa scans. Follows with [facility] annually due to hx of frequency/urgency improved on topical estrogen. Also known cystocele with  prolapse, rectocele, mild, sx improved with estrogen. Osteoporosis, she has been declining bisphosphonate's due to family hx of intolerance.   She has Sjogrens dx in  2012, mild, that is being monitored by Dr. Kathi Ludwig.   She has hx of depression/anxiety; formerly on lexapro and buspar but tapered off and reports continues to do well. Has taken amitriptyline PRN in the past for sleep but not recently. Hx of palpitations with benign workup and now takes propranolol PRN  BMI is Body mass index is 27.03 kg/m., she is working on diet and exercise, just restarted walking.  Wt Readings from Last 3 Encounters:  07/13/20 177 lb 12.8 oz (80.6 kg)  10/20/19 163 lb (73.9 kg)  07/14/19 144 lb (65.3 kg)   She had abnormal carotid in 05/2017 suggesting 40-59% stenosis and LICA suggesting 1-39% stenosis. MRA 05/2017 showed mild left carotid stenosis She had recheck [place] the following year 12/2018 that was essentially normal. She would like to repeat one more time.   Known VSD, stable unchanged per ECHO in 2016.   Her blood pressure has been controlled at home, at home it is normal (consistently 110-120/65-70), today their BP is BP: (!) 142/76.   She does not workout. She denies chest pain, shortness of breath, dizziness.   She is not on cholesterol medication and denies myalgias. Her cholesterol is at goal. The cholesterol last visit was:  Lab Results  Component Value Date   CHOL 171 07/14/2019   HDL 64 07/14/2019   LDLCALC 91 07/14/2019   TRIG 69 07/14/2019   CHOLHDL 2.7 07/14/2019  . She has been working on diet and exercise for hx of prediabetes, she is not on bASA, she is not on ACE/ARB and denies foot ulcerations, hyperglycemia, hypoglycemia , increased appetite, nausea, paresthesia of the feet, polydipsia, polyuria, visual disturbances, vomiting and weight loss. Last A1C in the office was:  Lab Results  Component Value Date   HGBA1C 5.3 07/14/2019    Last GFR:  Lab Results  Component Value Date   GFRNONAA 97 07/14/2019   Patient is on Vitamin D supplement.  ? Taking 6000 IU daily  Lab Results  Component Value Date   VD25OH 34 07/14/2019       Current  Medications:   Current Outpatient Medications (Endocrine & Metabolic):  .  predniSONE (DELTASONE) 20 MG tablet, 2 tablets daily for 3 days, 1 tablet daily for 4 days.    Current Outpatient Medications (Analgesics):  .  acetaminophen (TYLENOL) 500 MG tablet, Take 1,000 mg by mouth every 6 (six) hours as needed for moderate pain or headache. .  ibuprofen (ADVIL) 800 MG tablet, Take 800 mg by mouth every 6 (six) hours as needed for moderate pain.   Current Outpatient Medications (Other):  Marland Kitchen  B Complex-C (SUPER B COMPLEX PO), Take 1 Dose by mouth 2 (two) times daily.  .  Calcium Carbonate-Vitamin D (CALTRATE 600+D PO), Take 1 tablet by mouth daily. Takes 500 mg QD .  Cholecalciferol (VITAMIN D) 2000 UNITS tablet, Take 5,000 Units by mouth daily. Marland Kitchen  ESTRADIOL VA, Place vaginally 2 (two) times daily. .  Magnesium 400 MG TABS, Take 250 mg by mouth daily. .  Omega 3-6-9 Fatty Acids (OMEGA 3-6-9 COMPLEX PO), Take 1 tablet by mouth daily with lunch.  .  Probiotic Product (PROBIOTIC DAILY PO), Take by mouth. (Patient not taking: Reported on 07/13/2020)  Health Maintenance:   Immunization History  Administered Date(s) Administered  . Influenza, High Dose Seasonal PF 02/10/2019  .  Influenza, Seasonal, Injecte, Preservative Fre 05/26/2016  . Influenza-Unspecified 01/25/2013, 02/09/2014, 02/08/2020  . PFIZER(Purple Top)SARS-COV-2 Vaccination 07/04/2019, 07/25/2019, 02/08/2020  . PPD Test 04/19/2013, 05/17/2014  . Pneumococcal Conjugate-13 10/20/2019  . Pneumococcal Polysaccharide-23 04/19/2013  . Td 07/13/2020  . Tdap 08/20/2010    Tetanus: 2012 wants to boost - Td administered today per patient preference Pneumovax:  2014 Flu vaccine: 2020 Shingrix: will discuss, no insurance coverage Covid 19: 3/3, 2021, pfizer  Pap: 08/2019 Dr. Jennette KettleNeal MGM: 09/05/2019, upcoming  DEXA: 09/05/2019 - osteoporosis per abstract entry, Dr. Anson OregonNeal/Syed managing Colonoscopy: 06/2019, internal hemorroids, 10 year recall    US carotid 12/2018--WAS NEAR NORMAL- previous in 2019 showed right ICA 60-79%, left 1-39%.  MRA head for pulsatile tinnitus negative  Last Dental Exam:  Twice yearly visits, last 2021 Last Eye Exam: Dr. Burundiman 2021, mild cataracts  Last Derm: Dr. Amy SwazilandJordan, annually[facility]  Patient Care Team: Lucky CowboyMcKeown, William, MD as PCP - General (Internal Medicine) Burundiman, Heather, OD (Optometry) Freddy FinnerNeal, W Ronald, MD as Consulting Physician (Obstetrics and Gynecology) Charlott RakesSchooler, Vincent, MD as Consulting Physician (Gastroenterology) Thurmon Fairroitoru, Mihai, MD as Consulting Physician (Cardiology) SwazilandJordan, Amy, MD as Consulting Physician (Dermatology) Rossie MuskratSyed, Tauseef G, MD as Consulting Physician (Rheumatology) Drema HalonNewman, Christopher E, MD as Consulting Physician (Otolaryngology)  Medical History:  Past Medical History:  Diagnosis Date  . Anemia   . Anxiety    situational  . Arthritis   . Complication of anesthesia   . Depression   . Family history of adverse reaction to anesthesia    Mother - nausea, daughter nausea  . GERD (gastroesophageal reflux disease)   . Heart murmur   . Migraines   . Osteoporosis   . PONV (postoperative nausea and vomiting) 1976, 2001   Nausea   . Sjogren's disease (HCC)   . Unspecified vitamin D deficiency   . VSD (ventricular septal defect), perimembranous    Allergies Allergies  Allergen Reactions  . Estradiol     Patch caused itching     SURGICAL HISTORY She  has a past surgical history that includes Esophagogastroduodenoscopy (01/28/13); Dilation and curettage of uterus; Ablation on endometriosis; cervical conization (1976); lip biopsy (06/2011); Colonoscopy (2012); and ORIF ankle fracture (Left, 11/17/2018).   FAMILY HISTORY Her family history includes ALS in an other family member; Bladder Cancer in her father; Breast cancer in her cousin; Hypertension in her father and mother; Multiple sclerosis in her cousin; Ovarian cancer in her maternal aunt; Parkinson's  disease in her cousin; Stroke in her father and mother; Uterine cancer in her maternal grandmother; Valvular heart disease in her mother.   SOCIAL HISTORY She  reports that she has never smoked. She has never used smokeless tobacco. She reports previous alcohol use. She reports that she does not use drugs.   Review of Systems: Review of Systems  Constitutional: Negative for chills, fever and malaise/fatigue.  HENT: Positive for tinnitus (recurrent R ear deep thumping only when lying down; saw ENT for this in 2020). Negative for congestion, ear discharge, ear pain, hearing loss, nosebleeds, sinus pain and sore throat.   Eyes: Negative.        Dry eye  Respiratory: Negative for cough, shortness of breath, wheezing and stridor.   Cardiovascular: Negative for chest pain, palpitations and leg swelling.  Gastrointestinal: Negative for abdominal pain, blood in stool, constipation, diarrhea, heartburn and melena.  Genitourinary: Negative.   Musculoskeletal: Negative.   Skin: Negative.   Neurological: Negative for dizziness, tingling, tremors, sensory change, speech change,  focal weakness, seizures, loss of consciousness and headaches.  Psychiatric/Behavioral: Negative for depression, substance abuse and suicidal ideas. The patient is not nervous/anxious and does not have insomnia.     Physical Exam: Estimated body mass index is 27.03 kg/m as calculated from the following:   Height as of this encounter: 5\' 8"  (1.727 m).   Weight as of this encounter: 177 lb 12.8 oz (80.6 kg). BP (!) 142/76   Pulse 79   Temp (!) 97.5 F (36.4 C)   Ht 5\' 8"  (1.727 m)   Wt 177 lb 12.8 oz (80.6 kg)   SpO2 97%   BMI 27.03 kg/m   General Appearance: Well nourished well developed, in no apparent distress.  Eyes: PERRLA, EOMs, conjunctiva no swelling or erythema ENT/Mouth: Ear canals normal without obstruction, swelling, erythema, or discharge.  TMs normal left ear with no erythema, bulging, retraction, or loss  of landmarks but right ear with serous effusion, no bulging.  Oropharynx moist and clear with no exudate, erythema, or swelling.  Neck: Supple, thyroid normal. No bruits.  No cervical adenopathy Respiratory: Respiratory effort normal, Breath sounds clear A&P without wheeze, rhonchi, rales.   Cardio: RRR with holosystolic low pitched murmur heard best on the L 3rd ICS, no rubs or gallops. Brisk peripheral pulses without edema.  Chest: symmetric, with normal excursions Breasts: Deferred to obgyn.  Abdomen: Soft, nontender, no guarding, rebound, hernias, masses, or organomegaly.  Lymphatics: Non tender without lymphadenopathy. Musculoskeletal: Full ROM all peripheral extremities,5/5 strength, and normal gait.  Skin: Warm, dry without rashes, lesions, ecchymosis. Neuro: Awake and oriented X 3, Cranial nerves intact, reflexes equal bilaterally. Normal muscle tone, no cerebellar symptoms. Sensation intact.  Psych:  normal affect, Insight and Judgment appropriate.  GU: Deferred to obgyn   EKG: NSR  Over 40 minutes of exam, counseling, chart review and critical decision making was performed  11:54 AM [facility]

## 2020-07-13 ENCOUNTER — Ambulatory Visit (INDEPENDENT_AMBULATORY_CARE_PROVIDER_SITE_OTHER): Payer: Medicare Other | Admitting: Adult Health

## 2020-07-13 ENCOUNTER — Other Ambulatory Visit: Payer: Self-pay

## 2020-07-13 ENCOUNTER — Encounter: Payer: Self-pay | Admitting: Adult Health

## 2020-07-13 VITALS — BP 142/76 | HR 79 | Temp 97.5°F | Ht 68.0 in | Wt 177.8 lb

## 2020-07-13 DIAGNOSIS — M3500 Sicca syndrome, unspecified: Secondary | ICD-10-CM | POA: Diagnosis not present

## 2020-07-13 DIAGNOSIS — Z23 Encounter for immunization: Secondary | ICD-10-CM | POA: Diagnosis not present

## 2020-07-13 DIAGNOSIS — E559 Vitamin D deficiency, unspecified: Secondary | ICD-10-CM

## 2020-07-13 DIAGNOSIS — Z136 Encounter for screening for cardiovascular disorders: Secondary | ICD-10-CM

## 2020-07-13 DIAGNOSIS — F3341 Major depressive disorder, recurrent, in partial remission: Secondary | ICD-10-CM

## 2020-07-13 DIAGNOSIS — Z131 Encounter for screening for diabetes mellitus: Secondary | ICD-10-CM

## 2020-07-13 DIAGNOSIS — Z1389 Encounter for screening for other disorder: Secondary | ICD-10-CM

## 2020-07-13 DIAGNOSIS — M810 Age-related osteoporosis without current pathological fracture: Secondary | ICD-10-CM

## 2020-07-13 DIAGNOSIS — G43809 Other migraine, not intractable, without status migrainosus: Secondary | ICD-10-CM

## 2020-07-13 DIAGNOSIS — I6529 Occlusion and stenosis of unspecified carotid artery: Secondary | ICD-10-CM | POA: Diagnosis not present

## 2020-07-13 DIAGNOSIS — E782 Mixed hyperlipidemia: Secondary | ICD-10-CM

## 2020-07-13 DIAGNOSIS — M8580 Other specified disorders of bone density and structure, unspecified site: Secondary | ICD-10-CM

## 2020-07-13 DIAGNOSIS — R030 Elevated blood-pressure reading, without diagnosis of hypertension: Secondary | ICD-10-CM

## 2020-07-13 DIAGNOSIS — F3342 Major depressive disorder, recurrent, in full remission: Secondary | ICD-10-CM

## 2020-07-13 DIAGNOSIS — Z1329 Encounter for screening for other suspected endocrine disorder: Secondary | ICD-10-CM

## 2020-07-13 DIAGNOSIS — D509 Iron deficiency anemia, unspecified: Secondary | ICD-10-CM

## 2020-07-13 DIAGNOSIS — Z0000 Encounter for general adult medical examination without abnormal findings: Secondary | ICD-10-CM

## 2020-07-13 DIAGNOSIS — Z6824 Body mass index (BMI) 24.0-24.9, adult: Secondary | ICD-10-CM

## 2020-07-13 DIAGNOSIS — H6504 Acute serous otitis media, recurrent, right ear: Secondary | ICD-10-CM

## 2020-07-13 DIAGNOSIS — R7309 Other abnormal glucose: Secondary | ICD-10-CM

## 2020-07-13 DIAGNOSIS — Z85828 Personal history of other malignant neoplasm of skin: Secondary | ICD-10-CM

## 2020-07-13 DIAGNOSIS — F419 Anxiety disorder, unspecified: Secondary | ICD-10-CM

## 2020-07-13 MED ORDER — PREDNISONE 20 MG PO TABS: ORAL_TABLET | ORAL | 0 refills | Status: DC

## 2020-07-13 NOTE — Patient Instructions (Addendum)
Bianca Fernandez , Thank you for taking time to come for your Annual Wellness Visit. I appreciate your ongoing commitment to your health goals. Please review the following plan we discussed and let me know if I can assist you in the future.   These are the goals we discussed: Goals    . Weight (lb) < 160 lb (72.6 kg)       This is a list of the screening recommended for you and due dates:  Health Maintenance  Topic Date Due  . COVID-19 Vaccine (4 - Booster for Pfizer series) 08/07/2020  . Tetanus Vaccine  08/19/2020  . Pneumonia vaccines (2 of 2 - PPSV23) 10/19/2020  . Mammogram  09/04/2021  . Colon Cancer Screening  07/11/2029  . Flu Shot  Completed  . DEXA scan (bone density measurement)  Completed  .  Hepatitis C: One time screening is recommended by Center for Disease Control  (CDC) for  adults born from [birth year] through 1967.   Completed  . HPV Vaccine  Aged Out      Heart Murmur A heart murmur is an extra sound that is caused by chaotic blood flow through the valves of the heart. The murmur can be heard as a "hum" or "whoosh" sound when blood flows through the heart. There are two types of heart murmurs:  Innocent (benign) murmurs. Most people with this type of heart murmur do not have a heart problem. Many children have innocent heart murmurs. Your health care provider may suggest some basic tests to find out whether your murmur is an innocent murmur. If an innocent heart murmur is found, there is no need for further tests or treatment and no need to restrict activities or stop playing sports.  Abnormal murmurs. These types of murmurs can occur in children and adults. Abnormal murmurs may be a sign of a more serious heart condition, such as a heart defect present at birth (congenital defect) or heart valve disease. What are the causes? The heart has four areas called chambers. Valves separate the upper and lower chambers from each other (tricuspid valve and mitral valve) and separate  the lower chambers of the heart from pathways that lead away from the heart (aortic valve and pulmonary valve). Normally, the valves open to let blood flow through or out of your heart, and then they shut to keep the blood from flowing backward. This condition is caused by heart valves that are not working properly.  In children, abnormal heart murmurs are typically caused by congenital defects.  In adults, abnormal murmurs are usually caused by heart valve problems from disease, infection, or aging. This condition may also be caused by:  Pregnancy.  Fever.  Overactive thyroid gland.  Anemia.  Exercise.  Rapid growth spurts (in children).   What are the signs or symptoms? Innocent murmurs do not cause symptoms, and many people with abnormal murmurs may not have symptoms. If symptoms do develop, they may include:  Shortness of breath.  Blue coloring of the skin, especially on the fingertips.  Chest pain.  Palpitations, or feeling a fluttering or skipped heartbeat.  Fainting.  Persistent cough.  Getting tired much faster than expected.  Swelling in the abdomen, feet, or ankles. How is this diagnosed? This condition may be diagnosed during a routine physical or other exam. If your health care provider hears a murmur with a stethoscope, he or she will listen for:  Where the murmur is located in your heart.  How long the murmur  lasts (duration).  When the murmur is heard during the heartbeat.  How loud the murmur is. This may help the health care provider figure out what is causing the murmur. You may be referred to a heart specialist (cardiologist). You may also have other tests, including:  Electrocardiogram (ECG or EKG). This test measures the electrical activity of your heart.  Echocardiogram. This test uses high frequency sound waves to make pictures of your heart.  MRI or chest X-ray.  Cardiac catheterization. This test looks at blood flow through the arteries  around the heart. For children and adults who have an abnormal heart murmur and want to stay active, it is important to:  Complete testing.  Review test results.  Receive recommendations from your health care provider. If heart disease is present, it may not be safe to play or be active. How is this treated? Heart murmurs themselves do not need treatment. In some cases, a heart murmur may go away on its own. If an underlying problem or disease is causing the murmur, you may need treatment. If treatment is needed, it will depend on the type and severity of the disease or heart problem causing the murmur. Treatment may include:  Medicine.  Surgery.  Dietary and lifestyle changes. Follow these instructions at home:  Talk with your health care provider before participating in sports or other activities that require a lot of effort and energy (are strenuous).  Learn as much as possible about your condition and any related diseases. Ask your health care provider if you may be at risk for any medical emergencies.  Talk with your health care provider about what symptoms you should look out for.  It is up to you to get your test results. Ask your health care provider, or the department that is doing the test, when your results will be ready.  Keep all follow-up visits as told by your health care provider. This is important. Contact a health care provider if:  You are frequently short of breath.  You feel more tired than usual.  You are having a hard time keeping up with normal activities or fitness routines.  You have swelling in your ankles or feet.  You notice that your heart often beats irregularly.  You develop any new symptoms. Get help right away if:  You have chest pain.  You are having trouble breathing.  You feel light-headed or you pass out.  Your symptoms suddenly get worse. These symptoms may represent a serious problem that is an emergency. Do not wait to see if  the symptoms will go away. Get medical help right away. Call your local emergency services (911 in the [place] Do not drive yourself to the hospital. Summary  Normally, the heart valves open to let blood flow through or out of your heart, and then they shut to keep the blood from flowing backward.  A heart murmur is caused by heart valves that are not working properly.  You may need treatment if an underlying problem or disease is causing the heart murmur. Treatment may include medicine, surgery, or dietary and lifestyle changes.  Talk with your health care provider before participating in sports or other activities that require a lot of effort and energy (are strenuous).  Talk with your health care provider about what symptoms you should watch out for. This information is not intended to replace advice given to you by your health care provider. Make sure you discuss any questions you have with your health  care provider. Document Revised: 10/20/2017 Document Reviewed: 10/20/2017 Elsevier Patient Education  2021 [employer].

## 2020-07-14 LAB — LIPID PANEL
Cholesterol: 188 mg/dL (ref <200)
HDL: 63 mg/dL (ref >=50)
LDL Cholesterol (Calc): 105 mg/dL (calc) — ABNORMAL HIGH
Non-HDL Cholesterol (Calc): 125 mg/dL (calc) (ref <130)
Total CHOL/HDL Ratio: 3 (calc) (ref <5.0)
Triglycerides: 108 mg/dL (ref <150)

## 2020-07-14 LAB — URINALYSIS, ROUTINE W REFLEX MICROSCOPIC
Bilirubin Urine: NEGATIVE
Glucose, UA: NEGATIVE
Hgb urine dipstick: NEGATIVE
Ketones, ur: NEGATIVE
Nitrite: NEGATIVE
Protein, ur: NEGATIVE
Specific Gravity, Urine: 1.009 (ref 1.001–1.03)
pH: 7.5 (ref 5.0–8.0)

## 2020-07-14 LAB — CBC WITH DIFFERENTIAL/PLATELET
Absolute Monocytes: 567 cells/uL (ref 200–950)
Basophils Absolute: 73 cells/uL (ref 0–200)
Basophils Relative: 0.9 %
Eosinophils Absolute: 89 cells/uL (ref 15–500)
Eosinophils Relative: 1.1 %
HCT: 37.6 % (ref 35.0–45.0)
Hemoglobin: 12.8 g/dL (ref 11.7–15.5)
Lymphs Abs: 1758 cells/uL (ref 850–3900)
MCH: 30.3 pg (ref 27.0–33.0)
MCHC: 34 g/dL (ref 32.0–36.0)
MCV: 89.1 fL (ref 80.0–100.0)
MPV: 9.6 fL (ref 7.5–12.5)
Monocytes Relative: 7 %
Neutro Abs: 5613 cells/uL (ref 1500–7800)
Neutrophils Relative %: 69.3 %
Platelets: 323 10*3/uL (ref 140–400)
RBC: 4.22 10*6/uL (ref 3.80–5.10)
RDW: 12.7 % (ref 11.0–15.0)
Total Lymphocyte: 21.7 %
WBC: 8.1 10*3/uL (ref 3.8–10.8)

## 2020-07-14 LAB — MICROALBUMIN / CREATININE URINE RATIO
Creatinine, Urine: 49 mg/dL (ref 20–275)
Microalb Creat Ratio: 6 mcg/mg creat (ref <30)
Microalb, Ur: 0.3 mg/dL

## 2020-07-14 LAB — COMPLETE METABOLIC PANEL WITH GFR
AG Ratio: 1.8 (calc) (ref 1.0–2.5)
ALT: 18 U/L (ref 6–29)
AST: 16 U/L (ref 10–35)
Albumin: 4.5 g/dL (ref 3.6–5.1)
Alkaline phosphatase (APISO): 70 U/L (ref 37–153)
BUN: 12 mg/dL (ref 7–25)
CO2: 26 mmol/L (ref 20–32)
Calcium: 9.7 mg/dL (ref 8.6–10.4)
Chloride: 100 mmol/L (ref 98–110)
Creat: 0.66 mg/dL (ref 0.50–0.99)
GFR, Est African American: 107 mL/min/{1.73_m2} (ref >=60)
GFR, Est Non African American: 92 mL/min/{1.73_m2} (ref >=60)
Globulin: 2.5 g/dL (calc) (ref 1.9–3.7)
Glucose, Bld: 98 mg/dL (ref 65–99)
Potassium: 4.6 mmol/L (ref 3.5–5.3)
Sodium: 136 mmol/L (ref 135–146)
Total Bilirubin: 0.4 mg/dL (ref 0.2–1.2)
Total Protein: 7 g/dL (ref 6.1–8.1)

## 2020-07-14 LAB — HEMOGLOBIN A1C
Hgb A1c MFr Bld: 5.7 % of total Hgb — ABNORMAL HIGH (ref <5.7)
Mean Plasma Glucose: 117 mg/dL
eAG (mmol/L): 6.5 mmol/L

## 2020-07-14 LAB — MAGNESIUM: Magnesium: 2 mg/dL (ref 1.5–2.5)

## 2020-07-14 LAB — MICROSCOPIC MESSAGE

## 2020-07-14 LAB — TSH: TSH: 2.37 mIU/L (ref 0.40–4.50)

## 2020-07-14 LAB — IRON,TIBC AND FERRITIN PANEL
%SAT: 28 % (calc) (ref 16–45)
Ferritin: 82 ng/mL (ref 16–288)
Iron: 85 ug/dL (ref 45–160)
TIBC: 302 mcg/dL (calc) (ref 250–450)

## 2020-07-14 LAB — VITAMIN D 25 HYDROXY (VIT D DEFICIENCY, FRACTURES): Vit D, 25-Hydroxy: 45 ng/mL (ref 30–100)

## 2020-07-24 ENCOUNTER — Encounter (HOSPITAL_COMMUNITY): Payer: BC Managed Care – PPO

## 2020-08-02 ENCOUNTER — Ambulatory Visit (HOSPITAL_COMMUNITY)
Admission: RE | Admit: 2020-08-02 | Discharge: 2020-08-02 | Disposition: A | Discharge status: Home / Self Care | Payer: Medicare Other | Source: Ambulatory Visit | Attending: Cardiovascular Disease | Admitting: Cardiovascular Disease

## 2020-08-02 ENCOUNTER — Other Ambulatory Visit: Payer: Self-pay

## 2020-08-02 DIAGNOSIS — I6523 Occlusion and stenosis of bilateral carotid arteries: Secondary | ICD-10-CM | POA: Diagnosis not present

## 2020-08-02 DIAGNOSIS — I6529 Occlusion and stenosis of unspecified carotid artery: Secondary | ICD-10-CM | POA: Insufficient documentation

## 2020-08-10 ENCOUNTER — Other Ambulatory Visit: Payer: Self-pay

## 2020-08-10 ENCOUNTER — Ambulatory Visit (INDEPENDENT_AMBULATORY_CARE_PROVIDER_SITE_OTHER): Payer: Medicare Other | Admitting: Otolaryngology

## 2020-08-10 ENCOUNTER — Encounter (INDEPENDENT_AMBULATORY_CARE_PROVIDER_SITE_OTHER): Payer: Self-pay | Admitting: Otolaryngology

## 2020-08-10 VITALS — Temp 97.7°F

## 2020-08-10 DIAGNOSIS — H6981 Other specified disorders of Eustachian tube, right ear: Secondary | ICD-10-CM | POA: Diagnosis not present

## 2020-08-10 DIAGNOSIS — I6529 Occlusion and stenosis of unspecified carotid artery: Secondary | ICD-10-CM

## 2020-08-10 DIAGNOSIS — Z8669 Personal history of other diseases of the nervous system and sense organs: Secondary | ICD-10-CM

## 2020-08-10 NOTE — Progress Notes (Signed)
HPI: Bianca Fernandez is a 65 y.o. female who presents is referred by her PCP for evaluation of right ear middle ear fluid.  She was treated with steroids and use of Flonase and is doing much better presently.  She previously had blockage of her hearing with fluid behind the ear a couple months ago.  She had a previous myringotomy tube placed by myself in the right ear over 2 years ago... She feels essentially normal today.  She does inquire on how to use the Flonase.  Past Medical History:  Diagnosis Date  . Anemia   . Anxiety    situational  . Arthritis   . Complication of anesthesia   . Depression   . Family history of adverse reaction to anesthesia    Mother - nausea, daughter nausea  . GERD (gastroesophageal reflux disease)   . Heart murmur   . Migraines   . Osteoporosis   . PONV (postoperative nausea and vomiting) 1976, 2001   Nausea   . Sjogren's disease (HCC)   . Unspecified vitamin D deficiency   . VSD (ventricular septal defect), perimembranous    Past Surgical History:  Procedure Laterality Date  . ABLATION ON ENDOMETRIOSIS    . cervical conization  1976  . COLONOSCOPY  2012  . DILATION AND CURETTAGE OF UTERUS     x 2  . ESOPHAGOGASTRODUODENOSCOPY  01/28/13   Dr Bosie Clos- dysmotility  . lip biopsy  06/2011   + sjogrens, Dr. Carolan Clines at [facility]  . ORIF ANKLE FRACTURE Left 11/17/2018   Procedure: OPEN REDUCTION INTERNAL FIXATION (ORIF) LEFT ANKLE FRACTURE;  Surgeon: Nadara Mustard, MD;  Location: [facility] OR;  Service: Orthopedics;  Laterality: Left;   Social History   Socioeconomic History  . Marital status: Married    Spouse name: Not on file  . Number of children: Not on file  . Years of education: Not on file  . Highest education level: Not on file  Occupational History  . Not on file  Tobacco Use  . Smoking status: Never Smoker  . Smokeless tobacco: Never Used  Vaping Use  . Vaping Use: Never used  Substance and Sexual Activity  . Alcohol use: Not Currently     Comment: rare  . Drug use: No  . Sexual activity: Not Currently    Partners: Male    Birth control/protection: Post-menopausal  Other Topics Concern  . Not on file  Social History Narrative  . Not on file   Social Determinants of Health   Financial Resource Strain: Not on file  Food Insecurity: Not on file  Transportation Needs: Not on file  Physical Activity: Not on file  Stress: Not on file  Social Connections: Not on file   Family History  Problem Relation Age of Onset  . Hypertension Mother   . Stroke Mother   . Valvular heart disease Mother   . Hypertension Father   . Stroke Father   . Bladder Cancer Father   . Breast cancer Cousin   . Ovarian cancer Maternal Aunt   . Uterine cancer Maternal Grandmother   . ALS Other   . Multiple sclerosis Cousin   . Parkinson's disease Cousin    Allergies  Allergen Reactions  . Estradiol     Patch caused itching    Prior to Admission medications   Medication Sig Start Date End Date Taking? Authorizing Provider  acetaminophen (TYLENOL) 500 MG tablet Take 1,000 mg by mouth every 6 (six) hours as needed for  moderate pain or headache.    [provider]  B Complex-C (SUPER B COMPLEX PO) Take 1 Dose by mouth 2 (two) times daily.     [provider]  Calcium Carbonate-Vitamin D (CALTRATE 600+D PO) Take 1 tablet by mouth daily. Takes 500 mg QD    [provider]  Cholecalciferol (VITAMIN D) 2000 UNITS tablet Take 5,000 Units by mouth daily.    [provider]  ESTRADIOL VA Place vaginally 2 (two) times daily.    [provider]  ibuprofen (ADVIL) 800 MG tablet Take 800 mg by mouth every 6 (six) hours as needed for moderate pain.    [provider]  Magnesium 400 MG TABS Take 250 mg by mouth daily.    [provider]  Omega 3-6-9 Fatty Acids (OMEGA 3-6-9 COMPLEX PO) Take 1 tablet by mouth daily with lunch.     [provider]  predniSONE (DELTASONE) 20 MG tablet 2  tablets daily for 3 days, 1 tablet daily for 4 days. 07/13/20   Judd Gaudier, NP  Probiotic Product (PROBIOTIC DAILY PO) Take by mouth. Patient not taking: Reported on 07/13/2020    [provider]     Positive ROS: Otherwise negative  All other systems have been reviewed and were otherwise negative with the exception of those mentioned in the HPI and as above.  Physical Exam: Constitutional: Alert, well-appearing, no acute distress Ears: External ears without lesions or tenderness.  Ear canals are clear bilaterally..  The left TM is clear.  The right TM is slightly retracted with some scar tissue on the right TM but no middle ear effusion or fluid noted.  As the middle ear space is clear.  On tuning fork testing Weber was midline and AC was greater than BC bilaterally. Nasal: External nose without lesions. Septum midline with mild rhinitis.. Clear nasal passages otherwise with no signs of infection.  Posterior nasal cavity is clear. Oral: Lips and gums without lesions. Tongue and palate mucosa without lesions. Posterior oropharynx clear. Neck: No palpable adenopathy or masses Respiratory: Breathing comfortably  Skin: No facial/neck lesions or rash noted.  Procedures  Assessment: History of chronic right ear eustachian tube dysfunction with history of recurrent serous otitis.  Middle ear is clear today.  Plan: Agree with use of Flonase.  Since she has no symptoms on the left side and has not had any problems with the left side discussed with her concerning use of Flonase 2 sprays in the right nostril daily at night. She will follow-up as needed if she has any persistent middle ear effusion that does not clear.   Narda Bonds, MD   CC:

## 2020-10-25 ENCOUNTER — Ambulatory Visit: Payer: Medicare Other | Admitting: Adult Health

## 2020-10-25 ENCOUNTER — Ambulatory Visit: Payer: Medicare Other | Admitting: Adult Health Nurse Practitioner

## 2020-12-10 ENCOUNTER — Ambulatory Visit: Payer: Medicare Other | Admitting: Adult Health

## 2021-01-17 ENCOUNTER — Encounter: Payer: Self-pay | Admitting: Adult Health

## 2021-01-17 NOTE — Progress Notes (Deleted)
MEDICARE ANNUAL VISIT  Assessment and Plan:  Annual Medicare Wellness Visit Due annually  Health maintenance reviewed  Mixed hyperlipidemia -continue medications, check lipids, decrease fatty foods, increase activity.   Elevated blood pressure reading without diagnosis of hypertension - continue medications, DASH diet, exercise and monitor at home. Call if greater than 130/80.   ABNORMAL GLUCOSE Discussed disease progression and risks Discussed diet/exercise, weight management and risk modification  Vitamin D deficiency Supplement for goal 60-100  Sjogren's syndrome, with unspecified organ involvement (HCC) Continue follow up Dr. Kathi Ludwig  Other migraine without status migrainosus, not intractable Avoid triggers  VSD (ventricular septal defect), perimembranous/ Murmur Monitoring; stable; denies concerning sx  Rectocele, female/ Cystocele with prolapse Monitoring; follows GYN and [facility]  Osteoporosis 08/2019 - due 2 years; Dr. Jennette Kettle and Dr. Kathi Ludwig are following Declines bisphosphonates Discussed calcium, Vit D, K2; weight bearing/pilates exercises 3 days a week ***  Anxiety Will do propranolol as needed for anxiety *** If any worsening depression/anxiety will add on lexapro  Depression, major, recurrent, in full remission (HCC) Doing well at this time Lifestyle discussed: diet/exerise, sleep hygiene, stress management, hydration  Medication management Each visit, monitor CBC, CMP/GFR, magnesium   Hx of skin cancer BCC/SCC; derm following annually    No orders of the defined types were placed in this encounter.    Discussed med's effects and SE's. Screening labs and tests as requested with regular follow-up as recommended.  Future Appointments  Date Time Provider Department Center  01/22/2021  9:30 AM Judd Gaudier, NP [facility] None  07/17/2021  9:00 AM Judd Gaudier, NP [facility] None    Plan:   During the course of the visit the patient was  educated and counseled about appropriate screening and preventive services including:   Pneumococcal vaccine  Prevnar 13 Influenza vaccine Td vaccine Screening electrocardiogram Bone densitometry screening Colorectal cancer screening Diabetes screening Glaucoma screening Nutrition counseling  Advanced directives: requested    HPI  65 y.o. female presents for AWV and follow up. She has Sjogren's disease (HCC); VSD (ventricular septal defect), perimembranous; Osteoporosis; Vitamin D deficiency; Migraines; Rectocele, female; Cystocele with prolapse; Abnormal glucose (prediabetes); Hyperlipidemia; Elevated blood pressure reading without diagnosis of hypertension; Depression, major, recurrent, in complete remission (HCC); and History of skin cancer on their problem list.  She is married, 3 children, no grandchildren. She is retired from part time work, mostly Psychologist, occupational.   She follows Dr. Jennette Kettle GYN annually.  Known cystocele with prolapse, rectocele, mild, urinary sx improved with topical estrogen, also follows [facility]. Osteoporosis- she has been declining bisphosphonate's due to family hx of intolerance. Takes vit D/K2 and active with resistance exercises.   She has Sjogrens dx in 2012, mild, that is being monitored by Dr. Kathi Ludwig.   She has hx of depression/anxiety, recently in remission off of medications.  Hx of palpitations with benign workup and now takes propranolol PRN  BMI is There is no height or weight on file to calculate BMI., she is working on diet and exercise, just restarted walking.  Wt Readings from Last 3 Encounters:  07/13/20 177 lb 12.8 oz (80.6 kg)  10/20/19 163 lb (73.9 kg)  07/14/19 144 lb (65.3 kg)   She had abnormal carotid in 05/2017 suggesting 40-59% stenosis and LICA suggesting 1-39% stenosis. MRA 05/2017 showed mild left carotid stenosis She had recheck [place] the following year 12/2018 and 07/2020, resolved to history.   Known VSD, stable unchanged per  ECHO in 2016.   Her blood pressure has  been controlled at home, at home it is normal (consistently 110-120/65-70), today their BP is  .   She does not workout. She denies chest pain, shortness of breath, dizziness.   She is not on cholesterol medication and denies myalgias. Her cholesterol is at goal. The cholesterol last visit was:  Lab Results  Component Value Date   CHOL 188 07/13/2020   HDL 63 07/13/2020   LDLCALC 105 (H) 07/13/2020   TRIG 108 07/13/2020   CHOLHDL 3.0 07/13/2020  . She has been working on diet and exercise for hx of prediabetes, she is not on bASA, she is not on ACE/ARB and denies foot ulcerations, hyperglycemia, hypoglycemia , increased appetite, nausea, paresthesia of the feet, polydipsia, polyuria, visual disturbances, vomiting and weight loss. Last A1C in the office was:  Lab Results  Component Value Date   HGBA1C 5.7 (H) 07/13/2020    Last GFR:  Lab Results  Component Value Date   [facility] 92 07/13/2020   Patient is on Vitamin D supplement.  ? Taking 6000 IU daily  Lab Results  Component Value Date   VD25OH 45 07/13/2020         Current Medications:   Current Outpatient Medications (Endocrine & Metabolic):    predniSONE (DELTASONE) 20 MG tablet, 2 tablets daily for 3 days, 1 tablet daily for 4 days.    Current Outpatient Medications (Analgesics):    acetaminophen (TYLENOL) 500 MG tablet, Take 1,000 mg by mouth every 6 (six) hours as needed for moderate pain or headache.   ibuprofen (ADVIL) 800 MG tablet, Take 800 mg by mouth every 6 (six) hours as needed for moderate pain.   Current Outpatient Medications (Other):    B Complex-C (SUPER B COMPLEX PO), Take 1 Dose by mouth 2 (two) times daily.    Calcium Carbonate-Vitamin D (CALTRATE 600+D PO), Take 1 tablet by mouth daily. Takes 500 mg QD   Cholecalciferol (VITAMIN D) 2000 UNITS tablet, Take 5,000 Units by mouth daily.   ESTRADIOL VA, Place vaginally 2 (two) times daily.   Magnesium 400 MG  TABS, Take 250 mg by mouth daily.   Omega 3-6-9 Fatty Acids (OMEGA 3-6-9 COMPLEX PO), Take 1 tablet by mouth daily with lunch.    Probiotic Product (PROBIOTIC DAILY PO), Take by mouth. (Patient not taking: Reported on 07/13/2020)  Health Maintenance:   Immunization History  Administered Date(s) Administered   Influenza, High Dose Seasonal PF 02/10/2019   Influenza, Seasonal, Injecte, Preservative Fre 05/26/2016   Influenza-Unspecified 01/25/2013, 02/09/2014, 02/08/2020   PFIZER(Purple Top)SARS-COV-2 Vaccination 07/04/2019, 07/25/2019, 02/08/2020   PPD Test 04/19/2013, 05/17/2014   Pneumococcal Conjugate-13 10/20/2019   Pneumococcal Polysaccharide-23 04/19/2013   Td 07/13/2020   Tdap 08/20/2010    Tetanus: 2012 wants to boost - Td administered today per patient preference Pneumovax:  2014 Flu vaccine: 2020 Shingrix: will discuss, no insurance coverage Covid 19: 3/3, 2021, pfizer  Pap: 08/2019 Dr. Jennette Kettle MGM: 09/05/2019, upcoming  DEXA: 09/05/2019 - osteoporosis per abstract entry, Dr. Anson [place] managing Colonoscopy: 06/2019, internal hemorroids, 10 year recall   US carotid 07/2020 - normal  MRA head for pulsatile tinnitus negative  Last Dental Exam:  Twice yearly visits, last 2021 Last Eye Exam: Dr. Bur[place]i 2021, mild cataracts  Last Derm: Dr. Amy [place], annually, hx of [facility], [facility]  Patient Care Team: Lucky Cowboy, MD as PCP - General (Internal Medicine) Burundi, Heather, OD (Optometry) Freddy Finner, MD as Consulting Physician (Obstetrics and Gynecology) Charlott Rakes, MD as Consulting Physician (Gastroenterology) Croitoru, Garvin,  MD as Consulting Physician (Cardiology) [place]az[place], Amy, MD as Consulting Physician (Dermatology) Rossie MuskratSyed, Tauseef G, MD as Consulting Physician (Rheumatology) Drema HalonNewman, Christopher E, MD as Consulting Physician (Otolaryngology)  Medical History:  Past Medical History:  Diagnosis Date   Anemia    Anxiety    situational   Arthritis    Closed trimalleolar  fracture of ankle, left, initial encounter 11/15/2018   Complication of anesthesia    Depression    Family history of adverse reaction to anesthesia    Mother - nausea, daughter nausea   GERD (gastroesophageal reflux disease)    Heart murmur    Migraines    Osteoporosis    PONV (postoperative nausea and vomiting) 1976, 2001   Nausea    Sjogren's disease (HCC)    Unspecified vitamin D deficiency    VSD (ventricular septal defect), perimembranous    Allergies Allergies  Allergen Reactions   Estradiol     Patch caused itching     SURGICAL HISTORY She  has a past surgical history that includes Esophagogastroduodenoscopy (01/28/13); Dilation and curettage of uterus; Ablation on endometriosis; cervical conization (1976); lip biopsy (06/2011); Colonoscopy (2012); and ORIF ankle fracture (Left, 11/17/2018).   FAMILY HISTORY Her family history includes ALS in an other family member; Bladder Cancer in her father; Breast cancer in her cousin; Hypertension in her father and mother; Multiple sclerosis in her cousin; Ovarian cancer in her maternal aunt; Parkinson's disease in her cousin; Stroke in her father and mother; Uterine cancer in her maternal grandmother; Valvular heart disease in her mother.   SOCIAL HISTORY She  reports that she has never smoked. She has never used smokeless tobacco. She reports that she does not currently use alcohol. She reports that she does not use drugs.  MEDICARE WELLNESS OBJECTIVES: Physical activity:   Cardiac risk factors:   Depression/mood screen:   Depression screen [facility] 2[facility] 07/13/2020  Decreased Interest 0  Down, Depressed, Hopeless 0  PHQ - 2 Score 0  Altered sleeping 0  Tired, decreased energy 0  Change in appetite 0  Feeling bad or failure about yourself  0  Trouble concentrating 0  Moving slowly or fidgety/restless 0  Suicidal thoughts 0  PHQ-9 Score 0  Difficult doing work/chores Not difficult at all    ADLs:  No flowsheet data found.    Cognitive Testing  Alert? Yes  Normal Appearance?Yes  Oriented to person? Yes  Place? Yes   Time? Yes  Recall of three objects?  Yes  Can perform simple calculations? Yes  Displays appropriate judgment?Yes  Can read the correct time from a watch face?Yes  EOL planning:       Review of Systems: Review of Systems  Constitutional:  Negative for chills, fever and malaise/fatigue.  HENT:  Positive for tinnitus (recurrent R ear deep thumping only when lying down; saw ENT for this in 2020). Negative for congestion, ear discharge, ear pain, hearing loss, nosebleeds, sinus pain and sore throat.   Eyes: Negative.        Dry eye  Respiratory:  Negative for cough, shortness of breath, wheezing and stridor.   Cardiovascular:  Negative for chest pain, palpitations and leg swelling.  Gastrointestinal:  Negative for abdominal pain, blood in stool, constipation, diarrhea, heartburn and melena.  Genitourinary: Negative.   Musculoskeletal: Negative.   Skin: Negative.   Neurological:  Negative for dizziness, tingling, tremors, sensory change, speech change, focal weakness, seizures, loss of consciousness and headaches.  Psychiatric/Behavioral:  Negative for depression, substance abuse and suicidal  ideas. The patient is not nervous/anxious and does not have insomnia.    Physical Exam: Estimated body mass index is 27.03 kg/m as calculated from the following:   Height as of 07/13/20: 5\' 8"  (1.727 m).   Weight as of 07/13/20: 177 lb 12.8 oz (80.6 kg). There were no vitals taken for this visit.  General Appearance: Well nourished well developed, in no apparent distress.  Eyes: PERRLA, EOMs, conjunctiva no swelling or erythema ENT/Mouth: Ear canals normal without obstruction, swelling, erythema, or discharge.  TMs normal left ear with no erythema, bulging, retraction, or loss of landmarks but right ear with serous effusion, no bulging.  Oropharynx moist and clear with no exudate, erythema, or swelling.   Neck: Supple, thyroid normal. No bruits.  No cervical adenopathy Respiratory: Respiratory effort normal, Breath sounds clear A&P without wheeze, rhonchi, rales.   Cardio: RRR with holosystolic low pitched murmur heard best on the L 3rd ICS, no rubs or gallops. Brisk peripheral pulses without edema.  Chest: symmetric, with normal excursions Breasts: Deferred to obgyn.  Abdomen: Soft, nontender, no guarding, rebound, hernias, masses, or organomegaly.  Lymphatics: Non tender without lymphadenopathy. Musculoskeletal: Full ROM all peripheral extremities,5/5 strength, and normal gait.  Skin: Warm, dry without rashes, lesions, ecchymosis. Neuro: Awake and oriented X 3, Cranial nerves intact, reflexes equal bilaterally. Normal muscle tone, no cerebellar symptoms. Sensation intact.  Psych:  normal affect, Insight and Judgment appropriate.  GU: Deferred to obgyn    Medicare Attestation I have personally reviewed: The patient's medical and social history Their use of alcohol, tobacco or illicit drugs Their current medications and supplements The patient's functional ability including ADLs,fall risks, home safety risks, cognitive, and hearing and visual impairment Diet and physical activities Evidence for depression or mood disorders  The patient's weight, height, BMI, and visual acuity have been recorded in the chart.  I have made referrals, counseling, and provided education to the patient based on review of the above and I have provided the patient with a written personalized care plan for preventive services.     09/12/20 Bianca Fernandez 2:56 PM [facility]

## 2021-01-21 ENCOUNTER — Ambulatory Visit: Payer: Medicare Other | Admitting: Adult Health

## 2021-01-22 ENCOUNTER — Ambulatory Visit: Payer: Medicare Other | Admitting: Adult Health

## 2021-01-22 DIAGNOSIS — N816 Rectocele: Secondary | ICD-10-CM

## 2021-01-22 DIAGNOSIS — E782 Mixed hyperlipidemia: Secondary | ICD-10-CM

## 2021-01-22 DIAGNOSIS — Z85828 Personal history of other malignant neoplasm of skin: Secondary | ICD-10-CM

## 2021-01-22 DIAGNOSIS — F3342 Major depressive disorder, recurrent, in full remission: Secondary | ICD-10-CM

## 2021-01-22 DIAGNOSIS — M810 Age-related osteoporosis without current pathological fracture: Secondary | ICD-10-CM

## 2021-01-22 DIAGNOSIS — E559 Vitamin D deficiency, unspecified: Secondary | ICD-10-CM

## 2021-01-22 DIAGNOSIS — R7309 Other abnormal glucose: Secondary | ICD-10-CM

## 2021-01-22 DIAGNOSIS — N814 Uterovaginal prolapse, unspecified: Secondary | ICD-10-CM

## 2021-01-22 DIAGNOSIS — M3500 Sicca syndrome, unspecified: Secondary | ICD-10-CM

## 2021-01-22 DIAGNOSIS — G43809 Other migraine, not intractable, without status migrainosus: Secondary | ICD-10-CM

## 2021-01-22 DIAGNOSIS — R030 Elevated blood-pressure reading, without diagnosis of hypertension: Secondary | ICD-10-CM

## 2021-01-22 DIAGNOSIS — Z0000 Encounter for general adult medical examination without abnormal findings: Secondary | ICD-10-CM

## 2021-01-22 DIAGNOSIS — Q210 Ventricular septal defect: Secondary | ICD-10-CM

## 2021-01-25 DIAGNOSIS — E663 Overweight: Secondary | ICD-10-CM | POA: Insufficient documentation

## 2021-01-25 NOTE — Progress Notes (Signed)
MEDICARE ANNUAL VISIT  Assessment and Plan:  Annual Medicare Wellness Visit Due annually  Health maintenance reviewed  Mixed hyperlipidemia -continue medications, check lipids, decrease fatty foods, increase activity.   Elevated blood pressure reading without diagnosis of hypertension - continue medications, DASH diet, exercise and monitor at home. Call if greater than 130/80.   ABNORMAL GLUCOSE Discussed disease progression and risks Discussed diet/exercise, weight management and risk modification  Vitamin D deficiency Supplement for goal 60-100  Sjogren's syndrome, with unspecified organ involvement (HCC) Continue follow up Dr. Kathi Ludwig  Other migraine without status migrainosus, not intractable Avoid triggers  VSD (ventricular septal defect), perimembranous/ Murmur Monitoring; stable; denies concerning sx  Rectocele, female/ Cystocele with prolapse Monitoring; follows GYN and [facility]  Osteoporosis 08/2019 - due 2 years; Dr. Jennette Kettle and Dr. Kathi Ludwig are following Declines bisphosphonates Discussed calcium, Vit D, K2; weight bearing/pilates exercises 3 days a week   Anxiety Recently well controlled; continue stress management/lifestyle  Depression, major, recurrent, in full remission (HCC) Doing well at this time Lifestyle discussed: diet/exerise, sleep hygiene, stress management, hydration  Medication management Each visit, monitor CBC, CMP/GFR, magnesium   Hx of skin cancer BCC/SCC; derm following annually    Orders Placed This Encounter  Procedures   CBC with Differential/Platelet   COMPLETE METABOLIC PANEL WITH GFR   Magnesium   Lipid panel   TSH   Hemoglobin A1c      Discussed med's effects and SE's. Screening labs and tests as requested with regular follow-up as recommended.  Future Appointments  Date Time Provider Department Center  07/17/2021  9:00 AM Judd Gaudier, NP [facility] None  01/29/2022  2:30 PM Judd Gaudier, NP [facility] None     Plan:   During the course of the visit the patient was educated and counseled about appropriate screening and preventive services including:   Pneumococcal vaccine  Prevnar 13 Influenza vaccine Td vaccine Screening electrocardiogram Bone densitometry screening Colorectal cancer screening Diabetes screening Glaucoma screening Nutrition counseling  Advanced directives: requested    HPI  65 y.o. female presents for AWV and follow up. She has Sjogren's disease (HCC); VSD (ventricular septal defect), perimembranous; Osteoporosis; Vitamin D deficiency; Migraines; Rectocele, female; Cystocele with prolapse; Abnormal glucose (prediabetes); Hyperlipidemia; Elevated blood pressure reading without diagnosis of hypertension; Depression, major, recurrent, in complete remission (HCC); History of skin cancer; and Overweight (BMI 25.0-29.9) on their problem list.  She is married, 3 children, no grandchildren. She is retired from part time work, mostly Psychologist, occupational.   She follows Dr. Jennette Kettle GYN annually.  Known cystocele with prolapse, rectocele, mild, urinary sx improved with topical estrogen, also follows [facility]. Osteoporosis- she has been declining bisphosphonate's due to family hx of intolerance. Takes vit D/K2 and active with resistance exercises.   She has Sjogrens dx in 2012, mild, that is being monitored by Dr. Kathi Ludwig.   She has hx of depression/anxiety, recently in remission off of medications.  Hx of palpitations with benign workup and now takes propranolol PRN  BMI is Body mass index is 27.52 kg/m., she is working on diet and exercise, reports has been walking 20-30 min, but admits hasn't been eating as good since husband retired.  Wt Readings from Last 3 Encounters:  01/29/21 181 lb (82.1 kg)  07/13/20 177 lb 12.8 oz (80.6 kg)  10/20/19 163 lb (73.9 kg)   She had abnormal carotid in 05/2017 suggesting 40-59% stenosis and LICA suggesting 1-39% stenosis. MRA 05/2017 showed mild  left carotid stenosis She had recheck [place] the  following year 12/2018 and 07/2020, resolved to history.   Known VSD, stable unchanged per ECHO in 2016.   Her blood pressure has been controlled at home, at home it is normal (consistently 110-120/65-70), today their BP is BP: 132/86.   She does workout. She denies chest pain, shortness of breath, dizziness.   She is not on cholesterol medication and denies myalgias. Her cholesterol is at goal. The cholesterol last visit was:  Lab Results  Component Value Date   CHOL 188 07/13/2020   HDL 63 07/13/2020   LDLCALC 105 (H) 07/13/2020   TRIG 108 07/13/2020   CHOLHDL 3.0 07/13/2020  . She has been working on diet and exercise for hx of prediabetes, she is not on bASA, she is not on ACE/ARB and denies foot ulcerations, hyperglycemia, hypoglycemia , increased appetite, nausea, paresthesia of the feet, polydipsia, polyuria, visual disturbances, vomiting and weight loss.  Last A1C in the office was:  Lab Results  Component Value Date   HGBA1C 5.7 (H) 07/13/2020    Last GFR:  Lab Results  Component Value Date   [facility] 92 07/13/2020   Patient is on Vitamin D supplement.  Taking 6000 IU daily  Lab Results  Component Value Date   VD25OH 45 07/13/2020         Current Medications:      Current Outpatient Medications (Analgesics):    acetaminophen (TYLENOL) 500 MG tablet, Take 1,000 mg by mouth every 6 (six) hours as needed for moderate pain or headache.   ibuprofen (ADVIL) 800 MG tablet, Take 800 mg by mouth every 6 (six) hours as needed for moderate pain.   Current Outpatient Medications (Other):    B Complex-C (SUPER B COMPLEX PO), Take 1 Dose by mouth 2 (two) times daily.    Calcium Carbonate-Vitamin D (CALTRATE 600+D PO), Take 1 tablet by mouth daily. Takes 500 mg QD   Cholecalciferol (VITAMIN D) 2000 UNITS tablet, Take 5,000 Units by mouth daily.   ESTRADIOL VA, Place vaginally 2 (two) times daily.   Magnesium 400 MG TABS, Take  250 mg by mouth daily.   Omega 3-6-9 Fatty Acids (OMEGA 3-6-9 COMPLEX PO), Take 1 tablet by mouth daily with lunch.   Health Maintenance:   Immunization History  Administered Date(s) Administered   Influenza, High Dose Seasonal PF 02/10/2019   Influenza, Seasonal, Injecte, Preservative Fre 05/26/2016   Influenza-Unspecified 01/25/2013, 02/09/2014, 02/08/2020   PFIZER(Purple Top)SARS-COV-2 Vaccination 07/04/2019, 07/25/2019, 02/08/2020   PPD Test 04/19/2013, 05/17/2014   Pneumococcal Conjugate-13 10/20/2019   Pneumococcal Polysaccharide-23 04/19/2013   Td 07/13/2020   Tdap 08/20/2010    Tetanus: 07/2020 Pneumovax:  2014 Flu vaccine: 01/2020, will get at [facility] in Oct Shingrix: thinking, cost barrier Covid 19: 3/3, 2021, pfizer  Pap: 08/2019 Dr. Jennette Kettle MGM: 09/05/2019, upcoming in Oct 2022 DEXA: 09/05/2019 - osteoporosis per abstract entry, Dr. Anson [place] managing, increase  Colonoscopy: 06/2019, internal hemorroids, 10 year recall   US carotid 07/2020 - normal  MRA head for pulsatile tinnitus negative  Last Dental Exam:  Dr. Mylinda Latina, Twice yearly visits, last 2022 Last Eye Exam: Dr. [place] 09/2020, mild cataracts stable  Last Derm: Dr. Amy [place], annually, last 08/2020, hx of [facility], SCC  Patient Care Team: Lucky Cowboy, MD as PCP - General (Internal Medicine) [place], Heather, OD (Optometry) Freddy Finner, MD as Consulting Physician (Obstetrics and Gynecology) Charlott Rakes, MD as Consulting Physician (Gastroenterology) Croitoru, Rachelle Hora, MD as Consulting Physician (Cardiology) [place], Amy, MD as Consulting Physician (Dermatology) Rossie Muskrat, MD  as Consulting Physician (Rheumatology) Drema Halon, MD as Consulting Physician (Otolaryngology)  Medical History:  Past Medical History:  Diagnosis Date   Anemia    Anxiety    situational   Arthritis    Closed trimalleolar fracture of ankle, left, initial encounter 11/15/2018   Complication of anesthesia    Depression     Family history of adverse reaction to anesthesia    Mother - nausea, daughter nausea   GERD (gastroesophageal reflux disease)    Heart murmur    Migraines    Osteoporosis    PONV (postoperative nausea and vomiting) 1976, 2001   Nausea    Sjogren's disease (HCC)    Unspecified vitamin D deficiency    VSD (ventricular septal defect), perimembranous    Allergies Allergies  Allergen Reactions   Estradiol     Patch caused itching     SURGICAL HISTORY She  has a past surgical history that includes Esophagogastroduodenoscopy (01/28/13); Dilation and curettage of uterus; Ablation on endometriosis; cervical conization (1976); lip biopsy (06/2011); Colonoscopy (2012); and ORIF ankle fracture (Left, 11/17/2018).   FAMILY HISTORY Her family history includes ALS in an other family member; Bladder Cancer in her father; Breast cancer in her cousin; Hypertension in her father and mother; Multiple sclerosis in her cousin; Ovarian cancer in her maternal aunt; Parkinson's disease in her cousin; Stroke in her father and mother; Uterine cancer in her maternal grandmother; Valvular heart disease in her mother.   SOCIAL HISTORY She  reports that she has never smoked. She has never used smokeless tobacco. She reports that she does not currently use alcohol. She reports that she does not use drugs.  MEDICARE WELLNESS OBJECTIVES: Physical activity: Current Exercise Habits: Home exercise routine, Type of exercise: walking, Time (Minutes): 30, Frequency (Times/Week): 7, Weekly Exercise (Minutes/Week): 210, Intensity: Mild, Exercise limited by: None identified Cardiac risk factors: Cardiac Risk Factors include: advanced age (>62men, >41 women);dyslipidemia;hypertension Depression/mood screen:   Depression screen [facility] 2/9 01/29/2021  Decreased Interest 0  Down, Depressed, Hopeless 0  PHQ - 2 Score 0  Altered sleeping -  Tired, decreased energy -  Change in appetite -  Feeling bad or failure about yourself  -   Trouble concentrating -  Moving slowly or fidgety/restless -  Suicidal thoughts -  PHQ-9 Score -  Difficult doing work/chores -    ADLs:  In your present state of health, do you have any difficulty performing the following activities: 01/29/2021  Hearing? N  Vision? N  Difficulty concentrating or making decisions? N  Walking or climbing stairs? N  Dressing or bathing? N  Doing errands, shopping? N  Some recent data might be hidden     Cognitive Testing  Alert? Yes  Normal Appearance?Yes  Oriented to person? Yes  Place? Yes   Time? Yes  Recall of three objects?  Yes  Can perform simple calculations? Yes  Displays appropriate judgment?Yes  Can read the correct time from a watch face?Yes  EOL planning: Does Patient Have a Medical Advance Directive?: No Would patient like information on creating a medical advance directive?: Yes (MAU/Ambulatory/Procedural Areas - Information given)     Review of Systems: Review of Systems  Constitutional:  Negative for chills, fever and malaise/fatigue.  HENT:  Positive for tinnitus (recurrent R ear deep thumping only when lying down; saw ENT for this in 2020). Negative for congestion, ear discharge, ear pain, hearing loss, nosebleeds, sinus pain and sore throat.   Eyes: Negative.  Dry eye  Respiratory:  Negative for cough, shortness of breath, wheezing and stridor.   Cardiovascular:  Negative for chest pain, palpitations and leg swelling.  Gastrointestinal:  Negative for abdominal pain, blood in stool, constipation, diarrhea, heartburn and melena.  Genitourinary: Negative.   Musculoskeletal: Negative.   Skin: Negative.   Neurological:  Negative for dizziness, tingling, tremors, sensory change, speech change, focal weakness, seizures, loss of consciousness and headaches.  Psychiatric/Behavioral:  Negative for depression, substance abuse and suicidal ideas. The patient is not nervous/anxious and does not have insomnia.    Physical  Exam: Estimated body mass index is 27.52 kg/m as calculated from the following:   Height as of 07/13/20: 5\' 8"  (1.727 m).   Weight as of this encounter: 181 lb (82.1 kg). BP 132/86   Pulse 78   Temp (!) 97.3 F (36.3 C)   Wt 181 lb (82.1 kg)   SpO2 99%   BMI 27.52 kg/m   General Appearance: Well nourished well developed, in no apparent distress.  Eyes: PERRLA, EOMs, conjunctiva no swelling or erythema ENT/Mouth: Ear canals normal without obstruction, swelling, erythema, or discharge.  TMs normal left ear with no erythema, bulging, retraction, or loss of landmarks but right ear with serous effusion, no bulging.  Oropharynx moist and clear with no exudate, erythema, or swelling.  Neck: Supple, thyroid normal. No bruits.  No cervical adenopathy Respiratory: Respiratory effort normal, Breath sounds clear A&P without wheeze, rhonchi, rales.   Cardio: RRR with holosystolic low pitched murmur heard best on the L 3rd ICS, 2/6, no rubs or gallops. Brisk peripheral pulses without edema.  Chest: symmetric, with normal excursions Breasts: Deferred to obgyn.  Abdomen: Soft, nontender, no guarding, rebound, hernias, masses, or organomegaly.  Lymphatics: Non tender without lymphadenopathy. Musculoskeletal: Full ROM all peripheral extremities,5/5 strength, and normal gait.  Skin: Warm, dry without rashes, lesions, ecchymosis. Neuro: Awake and oriented X 3, Cranial nerves intact, reflexes equal bilaterally. Normal muscle tone, no cerebellar symptoms. Sensation intact.  Psych:  normal affect, Insight and Judgment appropriate.  GU: Deferred to obgyn    Medicare Attestation I have personally reviewed: The patient's medical and social history Their use of alcohol, tobacco or illicit drugs Their current medications and supplements The patient's functional ability including ADLs,fall risks, home safety risks, cognitive, and hearing and visual impairment Diet and physical activities Evidence for  depression or mood disorders  The patient's weight, height, BMI, and visual acuity have been recorded in the chart.  I have made referrals, counseling, and provided education to the patient based on review of the above and I have provided the patient with a written personalized care plan for preventive services.     Melvern Ramone 4:05 PM [facility]

## 2021-01-29 ENCOUNTER — Ambulatory Visit (INDEPENDENT_AMBULATORY_CARE_PROVIDER_SITE_OTHER): Payer: Medicare Other | Admitting: Adult Health

## 2021-01-29 ENCOUNTER — Encounter: Payer: Self-pay | Admitting: Adult Health

## 2021-01-29 ENCOUNTER — Other Ambulatory Visit: Payer: Self-pay

## 2021-01-29 VITALS — BP 132/86 | HR 78 | Temp 97.3°F | Wt 181.0 lb

## 2021-01-29 DIAGNOSIS — N814 Uterovaginal prolapse, unspecified: Secondary | ICD-10-CM | POA: Diagnosis not present

## 2021-01-29 DIAGNOSIS — M3500 Sicca syndrome, unspecified: Secondary | ICD-10-CM

## 2021-01-29 DIAGNOSIS — Z79899 Other long term (current) drug therapy: Secondary | ICD-10-CM

## 2021-01-29 DIAGNOSIS — F3342 Major depressive disorder, recurrent, in full remission: Secondary | ICD-10-CM

## 2021-01-29 DIAGNOSIS — Z0001 Encounter for general adult medical examination with abnormal findings: Secondary | ICD-10-CM | POA: Diagnosis not present

## 2021-01-29 DIAGNOSIS — M810 Age-related osteoporosis without current pathological fracture: Secondary | ICD-10-CM

## 2021-01-29 DIAGNOSIS — G43809 Other migraine, not intractable, without status migrainosus: Secondary | ICD-10-CM

## 2021-01-29 DIAGNOSIS — E559 Vitamin D deficiency, unspecified: Secondary | ICD-10-CM

## 2021-01-29 DIAGNOSIS — Q210 Ventricular septal defect: Secondary | ICD-10-CM

## 2021-01-29 DIAGNOSIS — R7309 Other abnormal glucose: Secondary | ICD-10-CM

## 2021-01-29 DIAGNOSIS — R6889 Other general symptoms and signs: Secondary | ICD-10-CM

## 2021-01-29 DIAGNOSIS — E782 Mixed hyperlipidemia: Secondary | ICD-10-CM

## 2021-01-29 DIAGNOSIS — Z85828 Personal history of other malignant neoplasm of skin: Secondary | ICD-10-CM

## 2021-01-29 DIAGNOSIS — N816 Rectocele: Secondary | ICD-10-CM

## 2021-01-29 DIAGNOSIS — E663 Overweight: Secondary | ICD-10-CM

## 2021-01-29 DIAGNOSIS — Z0000 Encounter for general adult medical examination without abnormal findings: Secondary | ICD-10-CM

## 2021-01-29 NOTE — Patient Instructions (Signed)
Try vitamin D with K2 supplement  For calcium do 300 mg once or twice daily -         Eating Plan for Osteoporosis Osteoporosis causes your bones to become weak and brittle. This puts you at greater risk for bone breaks (fractures) from small bumps or falls. Making changes to your diet and increasing your physical activity can help strengthen your bones and improve your overall health. Calcium and vitamin D are nutrients that play an important role in bone health. Vitamin D helps your body use calcium and strengthen bones. It is important to get enough calcium and vitamin D as part of your eating plan for osteoporosis. What are tips for following this plan? Reading food labels Try to get at least 1,000 milligrams (mg) of calcium each day. Look for foods that have at least 50 mg of calcium per serving. Talk with your health care provider about taking a calcium supplement if you do not get enough calcium from food. Do not have more than 2,500 mg of calcium each day. This is the upper limit for food and nutritional supplements combined. Too much calcium may cause constipation and prevent you from absorbing other important nutrients. Choose foods that contain vitamin D. Take a daily vitamin supplement that contains 800-1,000 international units (IU) of vitamin D. The amount may be different depending on your age, body weight, and where you live. Talk with your dietitian or health care provider about how much vitamin D is right for you. Avoid foods that have more than 300 mg of sodium per serving. Too much sodium can cause your body to lose calcium. Talk with your dietitian or health care provider about how much sodium you are allowed each day. Shopping Do not buy foods with added salt, including: Salted snacks. Rosita Fire. Canned soups. Canned meats. Processed meats, such as bacon or precooked or cured meat like sausages or meat loaves. Smoked fish. Meal planning Eat balanced meals  that contain protein foods, fruits and vegetables, and foods rich in calcium and vitamin D. Eat at least 5 servings of fruits and vegetables each day. Eat 5-6 oz (142-170 g) of lean meat, poultry, fish, eggs, or beans each day. Lifestyle Do not use any products that contain nicotine or tobacco, such as cigarettes, e-cigarettes, and chewing tobacco. If you need help quitting, ask your health care provider. If your health care provider recommends that you lose weight: Work with a dietitian to develop an eating plan that will help you reach your desired weight goal. Exercise for at least 30 minutes a day, 5 or more days a week, or as told by your health care provider. Work with a physical therapist to develop an exercise plan that includes flexibility, balance, and strength exercises. Do not focus only on aerobic exercise. Do not drink alcohol if: Your health care provider tells you not to drink. You are pregnant, may be pregnant, or are planning to become pregnant. If you drink alcohol: Limit how much you use to: 0-1 drink a day for women. 0-2 drinks a day for men. Be aware of how much alcohol is in your drink. In the [place] one drink equals one 12 oz bottle of beer (355 mL), one 5 oz glass of wine (148 mL), or one 1 oz glass of hard liquor (44 mL). What foods should I eat? Foods high in calcium  Yogurt. Yogurt with fruit. Milk. Evaporated skim milk. Dry milk powder. Calcium-fortified orange juice. Parmesan cheese. Part-skim ricotta cheese.  Natural hard cheese. Cream cheese. Cottage cheese. Canned sardines. Canned salmon. Calcium-treated tofu. Calcium-fortified cereal bar. Calcium-fortified cereal. Calcium-fortified graham crackers. Cooked collard greens. Turnip greens. Broccoli. Kale. Almonds. White beans. Corn tortilla. Foods high in vitamin D Cod liver oil. Fatty fish, such as tuna, mackerel, and salmon. Milk. Fortified soy milk. Fortified fruit juice. Yogurt. Margarine. Egg  yolks. Foods high in protein Beef. Lamb. Pork tenderloin. Chicken breast. Tuna (canned). Fish fillet. Tofu. Cooked soy beans. Soy patty. Beans (canned or cooked). Cottage cheese. Yogurt. Peanut butter. Pumpkin seeds. Nuts. Sunflower seeds. Hard cheese. Milk or other milk products, such as soy milk. The items listed above may not be a complete list of foods and beverages you can eat. Contact a dietitian for more options. Summary Calcium and vitamin D are nutrients that play an important role in bone health and are an important part of your eating plan for osteoporosis. Eat balanced meals that contain protein foods, fruits and vegetables, and foods rich in calcium and vitamin D. Avoid foods that have more than 300 mg of sodium per serving. Too much sodium can cause your body to lose calcium. Exercise is an important part of prevention and treatment of osteoporosis. Aim for at least 30 minutes a day, 5 days a week. This information is not intended to replace advice given to you by your health care provider. Make sure you discuss any questions you have with your health care provider. Document Revised: 10/13/2019 Document Reviewed: 10/13/2019 Elsevier Patient Education  2022 [employer].

## 2021-01-30 LAB — CBC WITH DIFFERENTIAL/PLATELET
Absolute Monocytes: 636 cells/uL (ref 200–950)
Basophils Absolute: 61 cells/uL (ref 0–200)
Basophils Relative: 0.6 %
Eosinophils Absolute: 101 cells/uL (ref 15–500)
Eosinophils Relative: 1 %
HCT: 38.6 % (ref 35.0–45.0)
Hemoglobin: 12.9 g/dL (ref 11.7–15.5)
Lymphs Abs: 2434 cells/uL (ref 850–3900)
MCH: 29.9 pg (ref 27.0–33.0)
MCHC: 33.4 g/dL (ref 32.0–36.0)
MCV: 89.6 fL (ref 80.0–100.0)
MPV: 9.9 fL (ref 7.5–12.5)
Monocytes Relative: 6.3 %
Neutro Abs: 6868 cells/uL (ref 1500–7800)
Neutrophils Relative %: 68 %
Platelets: 293 10*3/uL (ref 140–400)
RBC: 4.31 10*6/uL (ref 3.80–5.10)
RDW: 13 % (ref 11.0–15.0)
Total Lymphocyte: 24.1 %
WBC: 10.1 10*3/uL (ref 3.8–10.8)

## 2021-01-30 LAB — LIPID PANEL
Cholesterol: 178 mg/dL (ref <200)
HDL: 63 mg/dL (ref >=50)
LDL Cholesterol (Calc): 96 mg/dL (calc)
Non-HDL Cholesterol (Calc): 115 mg/dL (calc) (ref <130)
Total CHOL/HDL Ratio: 2.8 (calc) (ref <5.0)
Triglycerides: 98 mg/dL (ref <150)

## 2021-01-30 LAB — COMPLETE METABOLIC PANEL WITH GFR
AG Ratio: 2 (calc) (ref 1.0–2.5)
ALT: 15 U/L (ref 6–29)
AST: 17 U/L (ref 10–35)
Albumin: 4.7 g/dL (ref 3.6–5.1)
Alkaline phosphatase (APISO): 69 U/L (ref 37–153)
BUN: 11 mg/dL (ref 7–25)
CO2: 28 mmol/L (ref 20–32)
Calcium: 9.7 mg/dL (ref 8.6–10.4)
Chloride: 99 mmol/L (ref 98–110)
Creat: 0.63 mg/dL (ref 0.50–1.05)
Globulin: 2.3 g/dL (calc) (ref 1.9–3.7)
Glucose, Bld: 85 mg/dL (ref 65–99)
Potassium: 4.5 mmol/L (ref 3.5–5.3)
Sodium: 136 mmol/L (ref 135–146)
Total Bilirubin: 0.6 mg/dL (ref 0.2–1.2)
Total Protein: 7 g/dL (ref 6.1–8.1)
eGFR: 98 mL/min/{1.73_m2} (ref >=60)

## 2021-01-30 LAB — HEMOGLOBIN A1C
Hgb A1c MFr Bld: 5.5 % of total Hgb (ref <5.7)
Mean Plasma Glucose: 111 mg/dL
eAG (mmol/L): 6.2 mmol/L

## 2021-01-30 LAB — MAGNESIUM: Magnesium: 2.1 mg/dL (ref 1.5–2.5)

## 2021-01-30 LAB — TSH: TSH: 2.85 mIU/L (ref 0.40–4.50)

## 2021-07-15 ENCOUNTER — Encounter: Payer: Medicare Other | Admitting: Adult Health

## 2021-07-16 NOTE — Progress Notes (Signed)
CPE  Assessment and Plan:  Encounter for Annual Physical Exam with abnormal findings Due annually  Health Maintenance reviewed Healthy lifestyle reviewed and goals set  Mixed hyperlipidemia -managing with lifestyle, check lipids, decrease fatty foods, increase activity.   Elevated blood pressure reading without diagnosis of hypertension - labile, well controlled on home check and manual recheck today by provider  - DASH diet, exercise and monitor at home. Call if persistently greater than 130/80.   ABNORMAL GLUCOSE Discussed disease progression and risks Discussed diet/exercise, weight management and risk modification - A1C  Vitamin D deficiency Supplement for goal 60-100  Sjogren's syndrome, with unspecified organ involvement (HCC) Continue follow up Dr. Kathi LudwigSyed  Other migraine without status migrainosus, not intractable Avoid triggers  VSD (ventricular septal defect), perimembranous/ Murmur Monitoring; stable; denies concerning sx  Rectocele, female/ Cystocele with prolapse Monitoring; denies concerning sx; follows GYN and [facility]  Osteoporosis 08/2019 - due 2 years - has upcoming planned in 10/223 with GYN; Dr. Jennette KettleNeal and Dr. Kathi LudwigSyed are following Declines bisphosphonates Discussed calcium, Vit D, K2; weight bearing/pilates exercises 3 days a week   Anxiety Recently well controlled; continue stress management/lifestyle  Depression, major, recurrent, in full remission (HCC) Doing well at this time off of meds Lifestyle discussed: diet/exerise, sleep hygiene, stress management, hydration  Medication management Each visit, monitor CBC, CMP/GFR, magnesium   Hx of skin cancer BCC/SCC; derm following annually   Need for pneumonia vaccination - 20 valent pneumococcal vaccine administered without complication today    Orders Placed This Encounter  Procedures   CBC with Differential/Platelet   COMPLETE METABOLIC PANEL WITH GFR   Magnesium   Lipid panel   TSH    Hemoglobin A1c   VITAMIN D 25 Hydroxy (Vit-D Deficiency, Fractures)   Microalbumin / creatinine urine ratio   Urinalysis, Routine w reflex microscopic   EKG 12-Lead      Discussed med's effects and SE's. Screening labs and tests as requested with regular follow-up as recommended.  Future Appointments  Date Time Provider Department Center  01/29/2022  2:30 PM Judd Gaudierorbett, Ayaan Shutes, NP [facility] None  07/18/2022  9:00 AM Judd Gaudierorbett, Khaleef Ruby, NP [facility] None    Plan:   During the course of the visit the patient was educated and counseled about appropriate screening and preventive services including:   Pneumococcal vaccine  Prevnar 13 Influenza vaccine Td vaccine Screening electrocardiogram Bone densitometry screening Colorectal cancer screening Diabetes screening Glaucoma screening Nutrition counseling  Advanced directives: requested    HPI  66 y.o. female presents for CPE and follow up. She has Sjogren's disease (HCC); VSD (ventricular septal defect), perimembranous; Osteoporosis; Vitamin D deficiency; Migraines; Rectocele, female; Cystocele with prolapse; Abnormal glucose (prediabetes); Hyperlipidemia; Elevated blood pressure reading without diagnosis of hypertension; Depression, major, recurrent, in complete remission (HCC); History of skin cancer; and Overweight (BMI 25.0-29.9) on their problem list.  She is married, 3 children, no grandchildren. She is retired from part time work, mostly Psychologist, occupationalhome keeper.   She follows Dr. Jennette KettleNeal GYN annually for PAPs and mmg.  Known cystocele with prolapse, rectocele, mild, urinary sx improved with topical estrogen, also follows [facility]. Osteoporosis- she has been declining bisphosphonate's due to family hx of intolerance. Takes vit D/K2 and active though admits needs to increase resistance exercises. Last DEXA 09/05/2019, abstracted results only, "osteoporosis." Has upcoming follow up planned in 02/2022.   She has Sjogrens dx in 2012, mild,  that is being monitored by Dr. Kathi LudwigSyed.   She has hx of depression/anxiety, recently in remission  off of medications though a bit more stress since husband retired.   BMI is Body mass index is 26.52 kg/m., she is working on diet and exercise, reports has been walking 20-30 min, trying to cook more fresh.  Wt Readings from Last 3 Encounters:  07/17/21 174 lb 6.4 oz (79.1 kg)  01/29/21 181 lb (82.1 kg)  07/13/20 177 lb 12.8 oz (80.6 kg)   She had abnormal carotid in 05/2017 suggesting 40-59% stenosis and LICA suggesting 1-39% stenosis. MRA 05/2017 showed mild left carotid stenosis. She had recheck [place] the following year 12/2018 and 07/2020, resolved to history.   Known VSD, stable unchanged per ECHO in 2016.  Hx of palpitations with benign workup and now takes propranolol PRN  Her blood pressure has been controlled at home, at home it is normal (consistently 110-120/65-70), today their BP is BP: 122/80.   She does workout. She denies chest pain, shortness of breath, dizziness.   She is not on cholesterol medication and denies myalgias. Her cholesterol is at goal. The cholesterol last visit was:  Lab Results  Component Value Date   CHOL 178 01/29/2021   HDL 63 01/29/2021   LDLCALC 96 01/29/2021   TRIG 98 01/29/2021   CHOLHDL 2.8 01/29/2021  . She has been working on diet and exercise for hx of prediabetes, she is not on bASA, she is not on ACE/ARB and denies foot ulcerations, hyperglycemia, hypoglycemia , increased appetite, nausea, paresthesia of the feet, polydipsia, polyuria, visual disturbances, vomiting and weight loss.  Last A1C in the office was:  Lab Results  Component Value Date   HGBA1C 5.5 01/29/2021    Last GFR:  Lab Results  Component Value Date   GFRNONAA 92 07/13/2020   Patient is on Vitamin D supplement.  Taking 5000 + 1000 IU daily  Lab Results  Component Value Date   VD25OH 45 07/13/2020         Current Medications:      Current Outpatient Medications  (Analgesics):    acetaminophen (TYLENOL) 500 MG tablet, Take 1,000 mg by mouth every 6 (six) hours as needed for moderate pain or headache.   ibuprofen (ADVIL) 800 MG tablet, Take 800 mg by mouth every 6 (six) hours as needed for moderate pain.   Current Outpatient Medications (Other):    B Complex-C (SUPER B COMPLEX PO), Take 1 Dose by mouth 2 (two) times daily.    Calcium Carbonate-Vitamin D (CALTRATE 600+D PO), Take 1 tablet by mouth daily. Takes 500 mg QD   Cholecalciferol (VITAMIN D) 2000 UNITS tablet, Take 5,000 Units by mouth daily.   ESTRADIOL VA, Place vaginally 2 (two) times daily.   Magnesium 400 MG TABS, Take 250 mg by mouth daily.   Omega 3-6-9 Fatty Acids (OMEGA 3-6-9 COMPLEX PO), Take 1 tablet by mouth daily with lunch.   Health Maintenance:   Immunization History  Administered Date(s) Administered   Influenza, High Dose Seasonal PF 02/10/2019, 02/20/2021   Influenza, Seasonal, Injecte, Preservative Fre 05/26/2016   Influenza-Unspecified 01/25/2013, 02/09/2014, 02/08/2020   PFIZER Comirnaty(Gray Top)Covid-19 Tri-Sucrose Vaccine 09/05/2020, 03/02/2021   PFIZER(Purple Top)SARS-COV-2 Vaccination 07/04/2019, 07/25/2019, 02/08/2020   PPD Test 04/19/2013, 05/17/2014   Pneumococcal Conjugate-13 10/20/2019   Pneumococcal Polysaccharide-23 04/19/2013   Td 07/13/2020   Tdap 08/20/2010   Zoster Recombinat (Shingrix) 07/11/2021   Health Maintenance  Topic Date Due   Pneumonia Vaccine 24+ Years old (3) 10/19/2020   COVID-19 Vaccine (6 - Booster for [employer] series) 04/27/2021   DEXA SCAN  09/04/2021   Zoster Vaccines- Shingrix (2 of 2) 09/05/2021   MAMMOGRAM  02/27/2022   COLONOSCOPY (Pts 45-37yrs Insurance coverage will need to be confirmed)  07/11/2029   TETANUS/TDAP  07/14/2030   INFLUENZA VACCINE  Completed   Hepatitis C Screening  Completed   HPV VACCINES  Aged Out   Shingrix: just had first shot at pharmacy   Pap: 02/2021 Dr. Jennette Kettle MGM: 02/27/2021 at Dr. Donnetta Hail office   DEXA: 09/05/2019 - osteoporosis per abstract entry, Dr. Anson [place] managing  -has upcoming  Colonoscopy: 06/2019, internal hemorroids, 10 year recall, Dr. Bosie Clos  Last Dental Exam:  Dr. Mylinda Latina, Twice yearly visits, last 2022 Last Eye Exam: Dr. [place] 09/2020, mild cataracts stable, goes annually  Last Derm: Dr. Amy [place], annually, last 08/2020, hx of BCC, SCC, has upcoming scheduled   Patient Care Team: Lucky Cowboy, MD as PCP - General (Internal Medicine) [place], Heather, OD (Optometry) Freddy Finner, MD as Consulting Physician (Obstetrics and Gynecology) Charlott Rakes, MD as Consulting Physician (Gastroenterology) Croitoru, Rachelle Hora, MD as Consulting Physician (Cardiology) [place], Amy, MD as Consulting Physician (Dermatology) Rossie Muskrat, MD as Consulting Physician (Rheumatology) Drema Halon, MD (Inactive) as Consulting Physician (Otolaryngology)  Medical History:  Past Medical History:  Diagnosis Date   Anemia    Anxiety    situational   Arthritis    Closed trimalleolar fracture of ankle, left, initial encounter 11/15/2018   Complication of anesthesia    Depression    Family history of adverse reaction to anesthesia    Mother - nausea, daughter nausea   GERD (gastroesophageal reflux disease)    Heart murmur    Migraines    Osteoporosis    PONV (postoperative nausea and vomiting) 1976, 2001   Nausea    Sjogren's disease (HCC)    Unspecified vitamin D deficiency    VSD (ventricular septal defect), perimembranous    Allergies Allergies  Allergen Reactions   Estradiol     Patch caused itching     SURGICAL HISTORY She  has a past surgical history that includes Esophagogastroduodenoscopy (01/28/13); Dilation and curettage of uterus; Ablation on endometriosis; cervical conization (1976); lip biopsy (06/2011); Colonoscopy (2012); and ORIF ankle fracture (Left, 11/17/2018).   FAMILY HISTORY Her family history includes ALS in an other family member; Bladder  Cancer in her father; Breast cancer in her cousin; Hypertension in her father and mother; Multiple sclerosis in her cousin; Ovarian cancer in her maternal aunt; Parkinson's disease in her cousin; Stroke in her father and mother; Uterine cancer in her maternal grandmother; Valvular heart disease in her mother.   SOCIAL HISTORY She  reports that she has never smoked. She has never used smokeless tobacco. She reports that she does not currently use alcohol. She reports that she does not use drugs.   Review of Systems: Review of Systems  Constitutional:  Negative for chills, fever and malaise/fatigue.  HENT:  Positive for tinnitus (recurrent R ear deep thumping only when lying down; neg MRA 2019 and saw ENT for this in 2020). Negative for congestion, ear discharge, ear pain, hearing loss, nosebleeds, sinus pain and sore throat.   Eyes: Negative.        Dry eye  Respiratory:  Negative for cough, shortness of breath, wheezing and stridor.   Cardiovascular:  Negative for chest pain, palpitations and leg swelling.  Gastrointestinal:  Negative for abdominal pain, blood in stool, constipation, diarrhea, heartburn and melena.  Genitourinary: Negative.   Musculoskeletal: Negative.   Skin: Negative.  Neurological:  Negative for dizziness, tingling, tremors, sensory change, speech change, focal weakness, seizures, loss of consciousness and headaches.  Psychiatric/Behavioral:  Negative for depression, substance abuse and suicidal ideas. The patient is not nervous/anxious and does not have insomnia.    Physical Exam: Estimated body mass index is 26.52 kg/m as calculated from the following:   Height as of this encounter: 5\' 8"  (1.727 m).   Weight as of this encounter: 174 lb 6.4 oz (79.1 kg). BP 122/80    Pulse 85    Temp (!) 96.6 F (35.9 C)    Ht 5\' 8"  (1.727 m)    Wt 174 lb 6.4 oz (79.1 kg)    SpO2 99%    BMI 26.52 kg/m   General Appearance: Well nourished well developed, in no apparent distress.   Eyes: PERRLA, EOMs, conjunctiva no swelling or erythema ENT/Mouth: Ear canals normal without obstruction, swelling, erythema, or discharge.  TMs normal left ear with no erythema, bulging, retraction, or loss of landmarks but right ear with serous effusion, no bulging.  Oropharynx moist and clear with no exudate, erythema, or swelling.  Neck: Supple, thyroid normal. No bruits.  No cervical adenopathy Respiratory: Respiratory effort normal, Breath sounds clear A&P without wheeze, rhonchi, rales.   Cardio: RRR with holosystolic low pitched murmur heard best on the L 3rd ICS, 3/6, no rubs or gallops. Brisk peripheral pulses without edema.  Chest: symmetric, with normal excursions Breasts: Deferred to obgyn.  Abdomen: Soft, nontender, no guarding, rebound, hernias, masses, or organomegaly.  Lymphatics: Non tender without lymphadenopathy. Musculoskeletal: Full ROM all peripheral extremities,5/5 strength, and normal gait.  Skin: Warm, dry without rashes, ecchymosis. She has seborrheic keratosis to left shoulder without erythema.  Neuro: Awake and oriented X 3, Cranial nerves intact, reflexes equal bilaterally. Normal muscle tone, no cerebellar symptoms. Sensation intact.  Psych:  normal affect, Insight and Judgment appropriate.  GU: Deferred to obgyn   EKG: NSR, NSCPT  , NP-C 9:34 AM [facility]

## 2021-07-17 ENCOUNTER — Ambulatory Visit (INDEPENDENT_AMBULATORY_CARE_PROVIDER_SITE_OTHER): Payer: Medicare Other | Admitting: Adult Health

## 2021-07-17 ENCOUNTER — Other Ambulatory Visit: Payer: Self-pay

## 2021-07-17 ENCOUNTER — Encounter: Payer: Self-pay | Admitting: Adult Health

## 2021-07-17 VITALS — BP 122/80 | HR 85 | Temp 96.6°F | Ht 68.0 in | Wt 174.4 lb

## 2021-07-17 DIAGNOSIS — M810 Age-related osteoporosis without current pathological fracture: Secondary | ICD-10-CM

## 2021-07-17 DIAGNOSIS — M3500 Sicca syndrome, unspecified: Secondary | ICD-10-CM

## 2021-07-17 DIAGNOSIS — E559 Vitamin D deficiency, unspecified: Secondary | ICD-10-CM

## 2021-07-17 DIAGNOSIS — Q210 Ventricular septal defect: Secondary | ICD-10-CM | POA: Diagnosis not present

## 2021-07-17 DIAGNOSIS — G43809 Other migraine, not intractable, without status migrainosus: Secondary | ICD-10-CM

## 2021-07-17 DIAGNOSIS — Z85828 Personal history of other malignant neoplasm of skin: Secondary | ICD-10-CM

## 2021-07-17 DIAGNOSIS — Z0001 Encounter for general adult medical examination with abnormal findings: Secondary | ICD-10-CM

## 2021-07-17 DIAGNOSIS — Z1329 Encounter for screening for other suspected endocrine disorder: Secondary | ICD-10-CM

## 2021-07-17 DIAGNOSIS — Z23 Encounter for immunization: Secondary | ICD-10-CM

## 2021-07-17 DIAGNOSIS — R030 Elevated blood-pressure reading, without diagnosis of hypertension: Secondary | ICD-10-CM

## 2021-07-17 DIAGNOSIS — Z136 Encounter for screening for cardiovascular disorders: Secondary | ICD-10-CM | POA: Diagnosis not present

## 2021-07-17 DIAGNOSIS — F3342 Major depressive disorder, recurrent, in full remission: Secondary | ICD-10-CM

## 2021-07-17 DIAGNOSIS — E782 Mixed hyperlipidemia: Secondary | ICD-10-CM

## 2021-07-17 DIAGNOSIS — R7309 Other abnormal glucose: Secondary | ICD-10-CM

## 2021-07-17 DIAGNOSIS — E663 Overweight: Secondary | ICD-10-CM

## 2021-07-17 DIAGNOSIS — Z1389 Encounter for screening for other disorder: Secondary | ICD-10-CM

## 2021-07-17 NOTE — Patient Instructions (Signed)
?  Ms. Flippen , ?Thank you for taking time to come for your Annual Wellness Visit. I appreciate your ongoing commitment to your health goals. Please review the following plan we discussed and let me know if I can assist you in the future.  ? ?These are the goals we discussed: ? Goals   ? ?  Weight (lb) < 160 lb (72.6 kg)   ? ?  ?  ?This is a list of the screening recommended for you and due dates:  ?Health Maintenance  ?Topic Date Due  ? Pneumonia Vaccine (3) 10/19/2020  ? COVID-19 Vaccine (6 - Booster for [employer] series) 04/27/2021  ? DEXA scan (bone density measurement)  09/04/2021  ? Zoster (Shingles) Vaccine (2 of 2) 09/05/2021  ? Mammogram  02/27/2022  ? Colon Cancer Screening  07/11/2029  ? Tetanus Vaccine  07/14/2030  ? Flu Shot  Completed  ? Hepatitis C Screening: USPSTF Recommendation to screen - Ages 38-79 yo.  Completed  ? HPV Vaccine  Aged Out  ? ? ? ?Know what a healthy weight is for you (roughly BMI <25) and aim to maintain this ? ?Aim for 7+ servings of fruits and vegetables daily ? ?65-80+ fluid ounces of water or unsweet tea for healthy kidneys ? ?Limit to max 1 drink of alcohol per day; avoid smoking/tobacco ? ?Limit animal fats in diet for cholesterol and heart health - choose grass fed whenever available ? ?Avoid highly processed foods, and foods high in saturated/trans fats ? ?Aim for low stress - take time to unwind and care for your mental health ? ?Aim for 150 min of moderate intensity exercise weekly for heart health, and weights twice weekly for bone health ? ?Aim for 7-9 hours of sleep daily ? ? ?A great goal to work towards is aiming to get in a serving daily of some of the most nutritionally dense foods - G- BOMBS daily  ? ? ? ? ? ? ? ? ? ?

## 2021-07-18 LAB — LIPID PANEL
Cholesterol: 184 mg/dL (ref <200)
HDL: 64 mg/dL (ref >=50)
LDL Cholesterol (Calc): 98 mg/dL (calc)
Non-HDL Cholesterol (Calc): 120 mg/dL (calc) (ref <130)
Total CHOL/HDL Ratio: 2.9 (calc) (ref <5.0)
Triglycerides: 123 mg/dL (ref <150)

## 2021-07-18 LAB — CBC WITH DIFFERENTIAL/PLATELET
Absolute Monocytes: 476 cells/uL (ref 200–950)
Basophils Absolute: 48 cells/uL (ref 0–200)
Basophils Relative: 0.7 %
Eosinophils Absolute: 61 cells/uL (ref 15–500)
Eosinophils Relative: 0.9 %
HCT: 38.1 % (ref 35.0–45.0)
Hemoglobin: 12.7 g/dL (ref 11.7–15.5)
Lymphs Abs: 1850 cells/uL (ref 850–3900)
MCH: 29.8 pg (ref 27.0–33.0)
MCHC: 33.3 g/dL (ref 32.0–36.0)
MCV: 89.4 fL (ref 80.0–100.0)
MPV: 9.6 fL (ref 7.5–12.5)
Monocytes Relative: 7 %
Neutro Abs: 4366 cells/uL (ref 1500–7800)
Neutrophils Relative %: 64.2 %
Platelets: 314 10*3/uL (ref 140–400)
RBC: 4.26 10*6/uL (ref 3.80–5.10)
RDW: 12.5 % (ref 11.0–15.0)
Total Lymphocyte: 27.2 %
WBC: 6.8 10*3/uL (ref 3.8–10.8)

## 2021-07-18 LAB — COMPLETE METABOLIC PANEL WITH GFR
AG Ratio: 1.8 (calc) (ref 1.0–2.5)
ALT: 26 U/L (ref 6–29)
AST: 24 U/L (ref 10–35)
Albumin: 4.6 g/dL (ref 3.6–5.1)
Alkaline phosphatase (APISO): 82 U/L (ref 37–153)
BUN: 9 mg/dL (ref 7–25)
CO2: 27 mmol/L (ref 20–32)
Calcium: 9.8 mg/dL (ref 8.6–10.4)
Chloride: 97 mmol/L — ABNORMAL LOW (ref 98–110)
Creat: 0.69 mg/dL (ref 0.50–1.05)
Globulin: 2.6 g/dL (calc) (ref 1.9–3.7)
Glucose, Bld: 93 mg/dL (ref 65–99)
Potassium: 4.5 mmol/L (ref 3.5–5.3)
Sodium: 134 mmol/L — ABNORMAL LOW (ref 135–146)
Total Bilirubin: 0.6 mg/dL (ref 0.2–1.2)
Total Protein: 7.2 g/dL (ref 6.1–8.1)
eGFR: 95 mL/min/{1.73_m2} (ref >=60)

## 2021-07-18 LAB — URINALYSIS, ROUTINE W REFLEX MICROSCOPIC
Bilirubin Urine: NEGATIVE
Glucose, UA: NEGATIVE
Hgb urine dipstick: NEGATIVE
Ketones, ur: NEGATIVE
Leukocytes,Ua: NEGATIVE
Nitrite: NEGATIVE
Protein, ur: NEGATIVE
Specific Gravity, Urine: 1.009 (ref 1.001–1.035)
pH: 6.5 (ref 5.0–8.0)

## 2021-07-18 LAB — MICROALBUMIN / CREATININE URINE RATIO
Creatinine, Urine: 50 mg/dL (ref 20–275)
Microalb Creat Ratio: 6 mcg/mg creat (ref <30)
Microalb, Ur: 0.3 mg/dL

## 2021-07-18 LAB — HEMOGLOBIN A1C
Hgb A1c MFr Bld: 5.7 %{Hb} — ABNORMAL HIGH
Mean Plasma Glucose: 117 mg/dL
eAG (mmol/L): 6.5 mmol/L

## 2021-07-18 LAB — MAGNESIUM: Magnesium: 2 mg/dL (ref 1.5–2.5)

## 2021-07-18 LAB — TSH: TSH: 3.02 mIU/L (ref 0.40–4.50)

## 2021-07-18 LAB — VITAMIN D 25 HYDROXY (VIT D DEFICIENCY, FRACTURES): Vit D, 25-Hydroxy: 50 ng/mL (ref 30–100)

## 2021-09-03 ENCOUNTER — Ambulatory Visit (INDEPENDENT_AMBULATORY_CARE_PROVIDER_SITE_OTHER): Payer: Medicare Other | Admitting: Cardiovascular Disease

## 2021-09-03 ENCOUNTER — Encounter: Payer: Self-pay | Admitting: Cardiovascular Disease

## 2021-09-03 VITALS — BP 116/76 | HR 89 | Ht 68.0 in | Wt 179.4 lb

## 2021-09-03 DIAGNOSIS — Q210 Ventricular septal defect: Secondary | ICD-10-CM

## 2021-09-03 DIAGNOSIS — R0602 Shortness of breath: Secondary | ICD-10-CM

## 2021-09-03 NOTE — Patient Instructions (Signed)
Medication Instructions:  ?No changes ?*If you need a refill on your cardiac medications before your next appointment, please call your pharmacy* ? ? ?Lab Work: ?None ordered ?If you have labs (blood work) drawn today and your tests are completely normal, you will receive your results only by: ?MyChart Message (if you have MyChart) OR ?A paper copy in the mail ?If you have any lab test that is abnormal or we need to change your treatment, we will call you to review the results. ? ? ?Testing/Procedures: ?Your physician has requested that you have an echocardiogram. Echocardiography is a painless test that uses sound waves to create images of your heart. It provides your doctor with information about the size and shape of your heart and how well your heart?s chambers and valves are working. You may receive an ultrasound enhancing agent through an IV if needed to better visualize your heart during the echo.This procedure takes approximately one hour. There are no restrictions for this procedure. This will take place at the [address].  ? ?Your physician has requested that you have an exercise tolerance test. For further information please visit [email or web address] Please also follow instruction sheet, as given. This will take place at [address] [facility], [address]. ?Do not drink or eat foods with caffeine for 24 hours before the test. (Chocolate, coffee, tea, or energy drinks) ?If you use an inhaler, bring it with you to the test. ?Do not smoke for 4 hours before the test. ?Wear comfortable shoes and clothing. ? ? ? ?Follow-Up: ?At [facility], you and your health needs are our priority.  As part of our continuing mission to provide you with exceptional heart care, we have created designated Provider Care Teams.  These Care Teams include your primary Cardiologist (physician) and Advanced Practice Providers (APPs -  Physician Assistants and Nurse Practitioners) who all work together to provide you  with the care you need, when you need it. ? ?We recommend signing up for the patient portal called "MyChart".  Sign up information is provided on this After Visit Summary.  MyChart is used to connect with patients for Virtual Visits (Telemedicine).  Patients are able to view lab/test results, encounter notes, upcoming appointments, etc.  Non-urgent messages can be sent to your provider as well.   ?To learn more about what you can do with MyChart, go to [email or web address].   ? ?Your next appointment:   ?12 month(s) ? ?The format for your next appointment:   ?In Person ? ?Provider:   ?Thurmon Fair, MD { ? ? ?Important Information About Sugar ? ? ? ? ? ? ?

## 2021-09-03 NOTE — Progress Notes (Signed)
Patient ID: Bianca Fernandez, female   DOB: 1955-08-13, 66 y.o.   MRN: [id number] ? ?  ?  ?Cardiology Office Note ? ? ?Date:  09/05/2021  ? ?ID:  Bianca Fernandez, DOB 1956/04/27, MRN [id number] ? ?PCP:  Lucky Cowboy, MD  ?Cardiologist:   Thurmon Fair, MD  ? ?Chief Complaint  ?Patient presents with  ? Shortness of Breath  ? ? ?  ?History of Present Illness: ?Bianca Fernandez is a 66 y.o. female who presents for  Follow-up for ventricular septal defect and Sjogren's disease. ? ?She has had some issues with shortness of breath.  Walking uphill she has noticed that her heart rate gets excessively fast (her smart watch clocked 145 bpm) and she feels unusually tired.  She denies chest pain at rest or with activity and does not have edema, orthopnea, PND, claudication, palpitations, dizziness, syncope, focal neurological complaints. ? ?She has a known small perimembranous VSD, but previous evaluation has never shown evidence of right heart enlargement or pulmonary hypertension. Her most recent echocardiogram was performed in 2016. ? ?She has been under some increased stress at home.  Her husband has been forced to retire in the spending more time at home.  He is not adjusting well and she thinks this is contributing to her tachycardia. ? ?Blood pressure was high when she first checked in, but normalized within a few minutes. ? ?Past Medical History:  ?Diagnosis Date  ? Anemia   ? Anxiety   ? situational  ? Arthritis   ? Closed trimalleolar fracture of ankle, left, initial encounter 11/15/2018  ? Complication of anesthesia   ? Depression   ? Family history of adverse reaction to anesthesia   ? Mother - nausea, daughter nausea  ? GERD (gastroesophageal reflux disease)   ? Heart murmur   ? Migraines   ? Osteoporosis   ? PONV (postoperative nausea and vomiting) 1976, 2001  ? Nausea   ? Sjogren's disease (HCC)   ? Unspecified vitamin D deficiency   ? VSD (ventricular septal defect), perimembranous   ? ? ?Past Surgical History:   ?Procedure Laterality Date  ? ABLATION ON ENDOMETRIOSIS    ? cervical conization  1976  ? COLONOSCOPY  2012  ? DILATION AND CURETTAGE OF UTERUS    ? x 2  ? ESOPHAGOGASTRODUODENOSCOPY  01/28/13  ? Dr Bosie Clos- dysmotility  ? lip biopsy  06/2011  ? + sjogrens, Dr. Carolan Clines at [facility]  ? ORIF ANKLE FRACTURE Left 11/17/2018  ? Procedure: OPEN REDUCTION INTERNAL FIXATION (ORIF) LEFT ANKLE FRACTURE;  Surgeon: Nadara Mustard, MD;  Location: [facility] OR;  Service: Orthopedics;  Laterality: Left;  ? ? ? ?Current Outpatient Medications  ?Medication Sig Dispense Refill  ? acetaminophen (TYLENOL) 500 MG tablet Take 1,000 mg by mouth every 6 (six) hours as needed for moderate pain or headache.    ? B Complex-C (SUPER B COMPLEX PO) Take 1 Dose by mouth 2 (two) times daily.     ? Calcium Carb-Cholecalciferol 600-5 MG-MCG TABS 2 tablets with food    ? Calcium Carbonate-Vitamin D (CALTRATE 600+D PO) Take 1 tablet by mouth daily. Takes 500 mg QD    ? Cholecalciferol (VITAMIN D) 2000 UNITS tablet Take 5,000 Units by mouth daily.    ? estradiol (ESTRACE) 0.1 MG/GM vaginal cream Place 1 g vaginally 2 (two) times a week.    ? Magnesium 400 MG TABS Take 250 mg by mouth daily.    ? Omega 3-6-9 Fatty Acids (OMEGA 3-6-9  COMPLEX PO) Take 1 tablet by mouth daily with lunch.     ? ESTRADIOL VA Place vaginally 2 (two) times daily.    ? ibuprofen (ADVIL) 800 MG tablet Take 800 mg by mouth every 6 (six) hours as needed for moderate pain. (Patient not taking: Reported on 09/03/2021)    ? ?No current facility-administered medications for this visit.  ? ? ?Allergies:   Estradiol  ? ? ?Social History:  The patient  reports that she has never smoked. She has never used smokeless tobacco. She reports that she does not currently use alcohol. She reports that she does not use drugs.  ? ?Family History:  The patient's family history includes ALS in an other family member; Bladder Cancer in her father; Breast cancer in her cousin; Hypertension in her father and mother;  Multiple sclerosis in her cousin; Ovarian cancer in her maternal aunt; Parkinson's disease in her cousin; Stroke in her father and mother; Uterine cancer in her maternal grandmother; Valvular heart disease in her mother.  ? ? ?ROS:  Please see the history of present illness.    ?Otherwise, review of systems positive for none.   ?All other systems are reviewed and negative.  ? ? ?PHYSICAL EXAM: ?VS:  BP 116/76   Pulse 89   Ht 5\' 8"  (1.727 m)   Wt 179 lb 6.4 oz (81.4 kg)   SpO2 96%   BMI 27.28 kg/m?  , BMI Body mass index is 27.28 kg/m?. ? ? ?General: Alert, oriented x3, no distress, mildly overweight ?Head: no evidence of trauma, PERRL, EOMI, no exophtalmos or lid lag, no myxedema, no xanthelasma; normal ears, nose and oropharynx ?Neck: normal jugular venous pulsations and no hepatojugular reflux; brisk carotid pulses without delay and no carotid bruits ?Chest: clear to auscultation, no signs of consolidation by percussion or palpation, normal fremitus, symmetrical and full respiratory excursions ?Cardiovascular: normal position and quality of the apical impulse, regular rhythm, normal first and second heart sounds, harsh 3/6 holosystolic murmur heard broadly throughout the precordium and in her back.  No diastolic murmurs, rubs or gallops ?Abdomen: no tenderness or distention, no masses by palpation, no abnormal pulsatility or arterial bruits, normal bowel sounds, no hepatosplenomegaly ?Extremities: no clubbing, cyanosis or edema; 2+ radial, ulnar and brachial pulses bilaterally; 2+ right femoral, posterior tibial and dorsalis pedis pulses; 2+ left femoral, posterior tibial and dorsalis pedis pulses; no subclavian or femoral bruits ?Neurological: grossly nonfocal ?Psych: Normal mood and affect ? ? ? ?EKG:  EKG is ordered today.  It shows normal sinus rhythm, questionable left atrial abnormality and, minor nonspecific ST segment changes.  QTc 440 ms. ? ?Recent Labs: ?07/17/2021: ALT 26; BUN 9; Creat 0.69;  Hemoglobin 12.7; Magnesium 2.0; Platelets 314; Potassium 4.5; Sodium 134; TSH 3.02  ? ? ?Lipid Panel ?   ?Component Value Date/Time  ? CHOL 184 07/17/2021 0948  ? TRIG 123 07/17/2021 0948  ? HDL 64 07/17/2021 0948  ? CHOLHDL 2.9 07/17/2021 0948  ? VLDL 13 05/26/2016 1411  ? LDLCALC 98 07/17/2021 0948  ? ?  ? ?Wt Readings from Last 3 Encounters:  ?09/03/21 179 lb 6.4 oz (81.4 kg)  ?07/17/21 174 lb 6.4 oz (79.1 kg)  ?01/29/21 181 lb (82.1 kg)  ?  ? ? ?ASSESSMENT AND PLAN: ? ?1.  Dyspnea: We will get a treadmill to get an objective assessment of her exercise capacity and heart rate response to exercise, also screen for coronary problems since she is almost 66 years old. ?2. VSD:  Perimembranous restrictive VSD, unlikely to cause hemodynamic complications.  Its been almost 7 years since her last echo, we will take a look and make sure there have been no changes. ? ? ? ?Current medicines are reviewed at length with the patient today.  The patient does not have concerns regarding medicines. ? ?The following changes have been made:  no change ? ?Labs/ tests ordered today include:  ? ?Orders Placed This Encounter  ?Procedures  ? Cardiac Stress Test: Informed Consent Details: Physician/Practitioner Attestation; Transcribe to consent form and obtain patient signature  ? Exercise Tolerance Test  ? EKG 12-Lead  ? ECHOCARDIOGRAM COMPLETE  ? ? ? ?Patient Instructions  ?Medication Instructions:  ?No changes ?*If you need a refill on your cardiac medications before your next appointment, please call your pharmacy* ? ? ?Lab Work: ?None ordered ?If you have labs (blood work) drawn today and your tests are completely normal, you will receive your results only by: ?MyChart Message (if you have MyChart) OR ?A paper copy in the mail ?If you have any lab test that is abnormal or we need to change your treatment, we will call you to review the results. ? ? ?Testing/Procedures: ?Your physician has requested that you have an echocardiogram.  Echocardiography is a painless test that uses sound waves to create images of your heart. It provides your doctor with information about the size and shape of your heart and how well your heart?s chambers and valves are wo

## 2021-09-05 ENCOUNTER — Encounter: Payer: Self-pay | Admitting: Cardiovascular Disease

## 2021-09-10 ENCOUNTER — Ambulatory Visit (HOSPITAL_COMMUNITY): Payer: Medicare Other | Attending: Cardiovascular Disease

## 2021-09-10 DIAGNOSIS — Q210 Ventricular septal defect: Secondary | ICD-10-CM | POA: Diagnosis present

## 2021-09-10 LAB — ECHOCARDIOGRAM COMPLETE
Area-P 1/2: 3.85 cm2
MV M vel: 6.36 m/s
MV Peak grad: 161.8 mmHg
Radius: 0.6 cm
S' Lateral: 3.7 cm

## 2021-09-17 ENCOUNTER — Telehealth (HOSPITAL_COMMUNITY): Payer: Self-pay | Admitting: *Deleted

## 2021-09-17 NOTE — Telephone Encounter (Signed)
Close encounter 

## 2021-09-18 ENCOUNTER — Ambulatory Visit (HOSPITAL_COMMUNITY)
Admission: RE | Admit: 2021-09-18 | Discharge: 2021-09-18 | Disposition: A | Discharge status: Home / Self Care | Payer: Medicare Other | Source: Ambulatory Visit | Attending: Cardiovascular Disease | Admitting: Cardiovascular Disease

## 2021-09-18 DIAGNOSIS — R0602 Shortness of breath: Secondary | ICD-10-CM | POA: Diagnosis present

## 2021-09-18 LAB — EXERCISE TOLERANCE TEST
Angina Index: 0
Base ST Depression (mm): 0 mm
Duke Treadmill Score: -4
Estimated workload: 7.1
Exercise duration (min): 6 min
Exercise duration (sec): 6 s
MPHR: 153 {beats}/min
Peak HR: 169 {beats}/min
Percent HR: 110 %
Rest HR: 83 {beats}/min
ST Depression (mm): 2 mm

## 2021-09-19 ENCOUNTER — Encounter: Payer: Self-pay | Admitting: *Deleted

## 2021-09-19 ENCOUNTER — Other Ambulatory Visit: Payer: Self-pay | Admitting: *Deleted

## 2021-09-19 DIAGNOSIS — R0602 Shortness of breath: Secondary | ICD-10-CM

## 2021-09-19 DIAGNOSIS — R072 Precordial pain: Secondary | ICD-10-CM

## 2021-09-19 MED ORDER — METOPROLOL TARTRATE 100 MG PO TABS: ORAL_TABLET | ORAL | 0 refills | Status: DC

## 2021-10-11 ENCOUNTER — Ambulatory Visit (HOSPITAL_COMMUNITY): Payer: Medicare Other

## 2021-10-14 ENCOUNTER — Other Ambulatory Visit: Payer: Self-pay

## 2021-10-14 DIAGNOSIS — R0602 Shortness of breath: Secondary | ICD-10-CM

## 2021-10-14 LAB — BASIC METABOLIC PANEL
BUN/Creatinine Ratio: 10 — ABNORMAL LOW (ref 12–28)
BUN: 7 mg/dL — ABNORMAL LOW (ref 8–27)
CO2: 23 mmol/L (ref 20–29)
Calcium: 9.5 mg/dL (ref 8.7–10.3)
Chloride: 94 mmol/L — ABNORMAL LOW (ref 96–106)
Creatinine, Ser: 0.71 mg/dL (ref 0.57–1.00)
Glucose: 100 mg/dL — ABNORMAL HIGH (ref 70–99)
Potassium: 4.3 mmol/L (ref 3.5–5.2)
Sodium: 131 mmol/L — ABNORMAL LOW (ref 134–144)
eGFR: 93 mL/min/{1.73_m2} (ref >59)

## 2021-10-17 ENCOUNTER — Telehealth (HOSPITAL_COMMUNITY): Payer: Self-pay | Admitting: *Deleted

## 2021-10-17 NOTE — Telephone Encounter (Signed)
Reaching out to patient to offer assistance regarding upcoming cardiac imaging study; pt verbalizes understanding of appt date/time, parking situation and where to check in, pre-test NPO status and medications ordered, and verified current allergies; name and call back number provided for further questions should they arise  Binta Statzer RN Navigator Cardiac Imaging [facility] [phone] office [phone] cell  Patient to take 100mg metoprolol tartrate two hours prior to her cardiac CT scan.  She is aware to arrive at 11am. 

## 2021-10-18 ENCOUNTER — Ambulatory Visit (HOSPITAL_COMMUNITY)
Admission: RE | Admit: 2021-10-18 | Discharge: 2021-10-18 | Disposition: A | Discharge status: Home / Self Care | Payer: Medicare Other | Source: Ambulatory Visit | Attending: Cardiovascular Disease | Admitting: Cardiovascular Disease

## 2021-10-18 DIAGNOSIS — R0602 Shortness of breath: Secondary | ICD-10-CM | POA: Insufficient documentation

## 2021-10-18 DIAGNOSIS — R072 Precordial pain: Secondary | ICD-10-CM | POA: Insufficient documentation

## 2021-10-18 MED ORDER — NITROGLYCERIN 0.4 MG SL SUBL
SUBLINGUAL_TABLET | SUBLINGUAL | Status: AC
  Administered 2021-10-18: 0.8 mg via SUBLINGUAL
  Filled 2021-10-18: qty 2

## 2021-10-18 MED ORDER — IOHEXOL 350 MG/ML SOLN
100.0000 mL | Freq: Once | INTRAVENOUS | Status: AC | PRN
Start: 2021-10-18 — End: 2021-10-18
  Administered 2021-10-18: 100 mL via INTRAVENOUS

## 2021-10-18 MED ORDER — NITROGLYCERIN 0.4 MG SL SUBL: 0.8000 mg | SUBLINGUAL_TABLET | Freq: Once | SUBLINGUAL | Status: AC

## 2021-10-18 MED ORDER — METOPROLOL TARTRATE 5 MG/5ML IV SOLN
INTRAVENOUS | Status: AC
  Administered 2021-10-18: 10 mg via INTRAVENOUS
  Filled 2021-10-18: qty 20

## 2021-10-18 MED ORDER — METOPROLOL TARTRATE 5 MG/5ML IV SOLN
10.0000 mg | INTRAVENOUS | Status: DC | PRN
  Administered 2021-10-18: 10 mg via INTRAVENOUS

## 2021-10-18 MED ORDER — DILTIAZEM HCL 25 MG/5ML IV SOLN
10.0000 mg | INTRAVENOUS | Status: DC | PRN
  Administered 2021-10-18: 10 mg via INTRAVENOUS

## 2021-10-18 MED ORDER — DILTIAZEM HCL 25 MG/5ML IV SOLN
INTRAVENOUS | Status: AC
  Administered 2021-10-18: 10 mg via INTRAVENOUS
  Filled 2021-10-18: qty 5

## 2022-01-29 ENCOUNTER — Ambulatory Visit: Payer: Medicare Other | Admitting: Adult Health

## 2022-02-02 NOTE — Progress Notes (Signed)
 MEDICARE ANNUAL VISIT  Assessment and Plan:  Annual Medicare Wellness Visit Due annually  Health maintenance reviewed  Mixed hyperlipidemia -continue medications, check lipids, decrease fatty foods, increase activity.   Elevated blood pressure reading without diagnosis of hypertension - continue medications, DASH diet, exercise and monitor at home. Call if greater than 130/80.   ABNORMAL GLUCOSE Discussed disease progression and risks Discussed diet/exercise, weight management and risk modification  Vitamin D deficiency Supplement for goal 60-100  Sjogren's syndrome, with unspecified organ involvement ([place]) Continue follow up Dr. Dossie Der  Other migraine without status migrainosus, not intractable Avoid triggers  VSD (ventricular septal defect), perimembranous/ Murmur Monitoring; stable; denies concerning sx  Rectocele, female/ Cystocele with prolapse Monitoring; follows GYN and [facility]  Osteoporosis 08/2019 - due 2 years; Dr. Nori Riis and Dr. Dossie Der are following, has follow up in 03/2022 Declines bisphosphonates Discussed calcium, Vit D, K2; weight bearing/pilates exercises 3 days a week   Anxiety Recently well controlled; continue stress management/lifestyle  Depression, major, recurrent, in full remission ([place]) Doing well at this time Lifestyle discussed: diet/exerise, sleep hygiene, stress management, hydration  Medication management Each visit, monitor CBC, CMP/GFR, magnesium   Hx of skin cancer BCC/SCC; derm following annually   Insect bite right lower extremity Appears to be healing at this point Continue to monitor, if redness begins to spread or mor swelling or pain notify the office   Orders Placed This Encounter  Procedures   CBC with Differential/Platelet   COMPLETE METABOLIC PANEL WITH GFR   Lipid panel   TSH   Hemoglobin A1c      Discussed med's effects and SE's. Screening labs and tests as requested with regular follow-up as  recommended.  Future Appointments  Date Time Provider [place]  07/23/2022 11:00 AM Unk Pinto, MD [facility] None  02/04/2023 11:00 AM Alycia Rossetti, NP [facility] None    Plan:   During the course of the visit the patient was educated and counseled about appropriate screening and preventive services including:   Pneumococcal vaccine  Prevnar 13 Influenza vaccine Td vaccine Screening electrocardiogram Bone densitometry screening Colorectal cancer screening Diabetes screening Glaucoma screening Nutrition counseling  Advanced directives: requested    HPI  66 y.o. female presents for AWV and follow up. She has Sjogren's disease ([place]); VSD (ventricular septal defect), perimembranous; Osteoporosis; Vitamin D deficiency; Migraines; Rectocele, female; Cystocele with prolapse; Abnormal glucose (prediabetes); Hyperlipidemia; Elevated blood pressure reading without diagnosis of hypertension; Depression, major, recurrent, in complete remission ([place]); History of skin cancer; and Overweight (BMI 25.0-29.9) on their problem list.  She is married, 3 children, no grandchildren. She is retired from part time work, mostly Financial planner.   She got a bite on back of right knee 2 weeks ago, was very itchy and red, is starting to resolve.  Did take Benadryl which helped relieve symptoms  She follows Dr. Nori Riis GYN annually.  Known cystocele with prolapse, rectocele, mild, urinary sx improved with topical estrogen, also follows [facility]. Osteoporosis- she has been declining bisphosphonate's due to family hx of intolerance. Takes vit D/K2 and active with resistance exercises.   She has Sjogrens dx in 2012, mild, that is being monitored by Dr. Dossie Der.   She has hx of depression/anxiety, recently in remission off of medications.  Hx of palpitations with benign workup and now takes propranolol PRN  BMI is Body mass index is 26.97 kg/m., she is working on diet and exercise, reports  has been walking 20-30 min, but admits hasn't been eating  as good since husband retired. Does not eat red meat, has started to cut back on simple carbs Wt Readings from Last 3 Encounters:  02/03/22 177 lb 6.4 oz (80.5 kg)  09/03/21 179 lb 6.4 oz (81.4 kg)  07/17/21 174 lb 6.4 oz (79.1 kg)   She had abnormal carotid in 05/2017 suggesting 62-22% stenosis and LICA suggesting 9-79% stenosis. MRA 05/2017 showed mild left carotid stenosis She had recheck [place] the following year 12/2018 and 07/2020, resolved to history. CT coronary with CTA- normal  Known VSD, stable unchanged per ECHO in 2016.   Her blood pressure has been controlled at home, at home it is normal (consistently 110-120/65-70), today their BP is BP: (!) 140/70.  BP Readings from Last 3 Encounters:  02/03/22 (!) 140/70  10/18/21 115/67  09/03/21 116/76  She does workout. She denies chest pain, shortness of breath, dizziness.    She is not on cholesterol medication and denies myalgias. Her cholesterol is at goal. The cholesterol last visit was:  Lab Results  Component Value Date   CHOL 184 07/17/2021   HDL 64 07/17/2021   LDLCALC 98 07/17/2021   TRIG 123 07/17/2021   CHOLHDL 2.9 07/17/2021  . She has been working on diet and exercise for hx of prediabetes, she is not on bASA, she is not on ACE/ARB and denies foot ulcerations, hyperglycemia, hypoglycemia , increased appetite, nausea, paresthesia of the feet, polydipsia, polyuria, visual disturbances, vomiting and weight loss.  Last A1C in the office was:  Lab Results  Component Value Date   HGBA1C 5.7 (H) 07/17/2021    Last GFR:  Lab Results  Component Value Date   EGFR 93 10/14/2021    Patient is on Vitamin D supplement.  Taking 6000 IU daily  Lab Results  Component Value Date   VD25OH 67 07/17/2021         Current Medications:      Current Outpatient Medications (Analgesics):    acetaminophen (TYLENOL) 500 MG tablet, Take 1,000 mg by mouth every 6 (six) hours  as needed for moderate pain or headache.   ibuprofen (ADVIL) 800 MG tablet, Take 800 mg by mouth every 6 (six) hours as needed for moderate pain. PRN   Current Outpatient Medications (Other):    B Complex-C (SUPER B COMPLEX PO), Take 1 Dose by mouth 2 (two) times daily.    Calcium Carbonate-Vitamin D (CALTRATE 600+D PO), Take 1 tablet by mouth daily. Takes 500 mg QD   Cholecalciferol (VITAMIN D) 2000 UNITS tablet, Take 5,000 Units by mouth daily.   estradiol (ESTRACE) 0.1 MG/GM vaginal cream, Place 1 g vaginally 2 (two) times a week.   Magnesium 400 MG TABS, Take 250 mg by mouth daily.   Omega 3-6-9 Fatty Acids (OMEGA 3-6-9 COMPLEX PO), Take 1 tablet by mouth daily with lunch.   Health Maintenance:   Immunization History  Administered Date(s) Administered   Influenza, High Dose Seasonal PF 02/10/2019, 02/20/2021   Influenza, Seasonal, Injecte, Preservative Fre 05/26/2016   Influenza-Unspecified 01/25/2013, 02/09/2014, 02/08/2020   PFIZER Comirnaty(Gray Top)Covid-19 Tri-Sucrose Vaccine 09/05/2020, 03/02/2021   PFIZER(Purple Top)SARS-COV-2 Vaccination 07/04/2019, 07/25/2019, 02/08/2020   PNEUMOCOCCAL CONJUGATE-20 07/17/2021   PPD Test 04/19/2013, 05/17/2014   Pneumococcal Conjugate-13 10/20/2019   Pneumococcal Polysaccharide-23 04/19/2013   Td 07/13/2020   Tdap 08/20/2010   Zoster Recombinat (Shingrix) 07/11/2021, 10/08/2021    Tetanus: 07/2020 Pneumovax:  2014 Flu vaccine: 01/2020, will get at [facility] Oct Shingrix: thinking, cost barrier Covid 19: 3/3, 2021, pfizer  Pap:  08/2019 Dr. Neal11/2023 DEXA: 09/05/2019 - appt 04/02/2022 Colonoscopy: 06/2019, internal hemorroids, 10 year recall   US carotid 07/2020 - normal  MRA head for pulsatile tinnitus negative  Last Dental Exam:  Dr. Derenda Mis, Twice yearly visits, last 10/2021 Last Eye Exam: Dr. [place] 10/2021, mild cataracts stable  Last Derm: Dr. Amy Martinique, annually, last 08/2020, hx of [facility], [facility]  Patient Care Team: Unk Pinto, MD  as PCP - General (Internal Medicine) Croitoru, Dani Gobble, MD as PCP - Cardiology (Cardiology) [place], Heather, [place] (Optometry) Maisie Fus, MD (Inactive) as Consulting Physician (Obstetrics and Gynecology) Wilford Corner, MD as Consulting Physician (Gastroenterology) Croitoru, Dani Gobble, MD as Consulting Physician (Cardiology) [place], Amy, MD as Consulting Physician (Dermatology) Valinda Party, MD as Consulting Physician (Rheumatology) Rozetta Nunnery, MD (Inactive) as Consulting Physician (Otolaryngology)  Medical History:  Past Medical History:  Diagnosis Date   Anemia    Anxiety    situational   Arthritis    Closed trimalleolar fracture of ankle, left, initial encounter 11/11/4285   Complication of anesthesia    Depression    Family history of adverse reaction to anesthesia    Mother - nausea, daughter nausea   GERD (gastroesophageal reflux disease)    Heart murmur    Migraines    Osteoporosis    PONV (postoperative nausea and vomiting) 1976, 2001   Nausea    Sjogren's disease ([place])    Unspecified vitamin D deficiency    VSD (ventricular septal defect), perimembranous    Allergies Allergies  Allergen Reactions   Estradiol     Patch caused itching     SURGICAL HISTORY She  has a past surgical history that includes Esophagogastroduodenoscopy (01/28/13); Dilation and curettage of uterus; Ablation on endometriosis; cervical conization (1976); lip biopsy (06/2011); Colonoscopy (2012); and ORIF ankle fracture (Left, 11/17/2018).   FAMILY HISTORY Her family history includes ALS in an other family member; Bladder Cancer in her father; Breast cancer in her cousin; Hypertension in her father and mother; Multiple sclerosis in her cousin; Ovarian cancer in her maternal aunt; Parkinson's disease in her cousin; Stroke in her father and mother; Uterine cancer in her maternal grandmother; Valvular heart disease in her mother.   SOCIAL HISTORY She  reports that she has never smoked. She  has never used smokeless tobacco. She reports that she does not currently use alcohol. She reports that she does not use drugs.  MEDICARE WELLNESS OBJECTIVES: Physical activity: Current Exercise Habits: Home exercise routine, Type of exercise: walking, Time (Minutes): > 60, Frequency (Times/Week): 4, Weekly Exercise (Minutes/Week): 0, Intensity: Mild Cardiac risk factors:   Depression/mood screen:      02/03/2022   10:49 AM  Depression screen PHQ 2/9  Decreased Interest 0  Down, Depressed, Hopeless 0  PHQ - 2 Score 0    ADLs:     02/03/2022   10:49 AM  In your present state of health, do you have any difficulty performing the following activities:  Hearing? 0  Vision? 0  Difficulty concentrating or making decisions? 0  Walking or climbing stairs? 0  Dressing or bathing? 0  Doing errands, shopping? 0     Cognitive Testing  Alert? Yes  Normal Appearance?Yes  Oriented to person? Yes  Place? Yes   Time? Yes  Recall of three objects?  Yes  Can perform simple calculations? Yes  Displays appropriate judgment?Yes  Can read the correct time from a watch face?Yes  EOL planning: Does Patient Have a Medical Advance Directive?: No Would patient like  information on creating a medical advance directive?: No - Patient declined     Review of Systems: Review of Systems  Constitutional:  Negative for chills, fever and malaise/fatigue.  HENT:  Positive for tinnitus (recurrent R ear deep thumping only when lying down; saw ENT for this in 2020). Negative for congestion, ear discharge, ear pain, hearing loss, nosebleeds, sinus pain and sore throat.   Eyes: Negative.        Dry eye  Respiratory:  Negative for cough, shortness of breath, wheezing and stridor.   Cardiovascular:  Negative for chest pain, palpitations and leg swelling.  Gastrointestinal:  Negative for abdominal pain, blood in stool, constipation, diarrhea, heartburn and melena.  Genitourinary: Negative.   Musculoskeletal:  Negative.   Skin: Negative.        Bit by insect itching behind right knee  Neurological:  Negative for dizziness, tingling, tremors, sensory change, speech change, focal weakness, seizures, loss of consciousness and headaches.  Psychiatric/Behavioral:  Negative for depression, substance abuse and suicidal ideas. The patient is not nervous/anxious and does not have insomnia.     Physical Exam: Estimated body mass index is 26.97 kg/m as calculated from the following:   Height as of this encounter: 5' 8"  (1.727 m).   Weight as of this encounter: 177 lb 6.4 oz (80.5 kg). BP (!) 140/70   Pulse (!) 105   Temp (!) 97.2 F (36.2 C)   Ht 5' 8"  (1.727 m)   Wt 177 lb 6.4 oz (80.5 kg)   SpO2 98%   BMI 26.97 kg/m   General Appearance: Well nourished well developed, in no apparent distress.  Eyes: PERRLA, EOMs, conjunctiva no swelling or erythema ENT/Mouth: Ear canals normal without obstruction, swelling, erythema, or discharge.  TMs normal left ear with no erythema, bulging, retraction, or loss of landmarks but right ear with serous effusion, no bulging.  Oropharynx moist and clear with no exudate, erythema, or swelling.  Neck: Supple, thyroid normal. No bruits.  No cervical adenopathy Respiratory: Respiratory effort normal, Breath sounds clear A&P without wheeze, rhonchi, rales.   Cardio: RRR with holosystolic low pitched murmur heard best on the L 3rd ICS, 2/6, no rubs or gallops. Brisk peripheral pulses without edema.  Chest: symmetric, with normal excursions Breasts: Deferred to obgyn.  Abdomen: Soft, nontender, no guarding, rebound, hernias, masses, or organomegaly.  Lymphatics: Non tender without lymphadenopathy. Musculoskeletal: Full ROM all peripheral extremities,5/5 strength, and normal gait.  Skin: Warm, dry . 2 cm erythematous area behind right knee- appears to be insect bite, very minimal induration Neuro: Awake and oriented X 3, Cranial nerves intact, reflexes equal bilaterally.  Normal muscle tone, no cerebellar symptoms. Sensation intact.  Psych:  normal affect, Insight and Judgment appropriate.  GU: Deferred to obgyn    Medicare Attestation I have personally reviewed: The patient's medical and social history Their use of alcohol, tobacco or illicit drugs Their current medications and supplements The patient's functional ability including ADLs,fall risks, home safety risks, cognitive, and hearing and visual impairment Diet and physical activities Evidence for depression or mood disorders  The patient's weight, height, BMI, and visual acuity have been recorded in the chart.  I have made referrals, counseling, and provided education to the patient based on review of the above and I have provided the patient with a written personalized care plan for preventive services.     Eren Ryser E  10:55 AM [facility]

## 2022-02-03 ENCOUNTER — Encounter: Payer: Self-pay | Admitting: Nurse Practitioner

## 2022-02-03 ENCOUNTER — Ambulatory Visit (INDEPENDENT_AMBULATORY_CARE_PROVIDER_SITE_OTHER): Payer: Medicare Other | Admitting: Nurse Practitioner

## 2022-02-03 VITALS — BP 140/70 | HR 105 | Temp 97.2°F | Ht 68.0 in | Wt 177.4 lb

## 2022-02-03 DIAGNOSIS — Z79899 Other long term (current) drug therapy: Secondary | ICD-10-CM

## 2022-02-03 DIAGNOSIS — R030 Elevated blood-pressure reading, without diagnosis of hypertension: Secondary | ICD-10-CM

## 2022-02-03 DIAGNOSIS — Z85828 Personal history of other malignant neoplasm of skin: Secondary | ICD-10-CM

## 2022-02-03 DIAGNOSIS — E663 Overweight: Secondary | ICD-10-CM

## 2022-02-03 DIAGNOSIS — Z0001 Encounter for general adult medical examination with abnormal findings: Secondary | ICD-10-CM

## 2022-02-03 DIAGNOSIS — E782 Mixed hyperlipidemia: Secondary | ICD-10-CM

## 2022-02-03 DIAGNOSIS — R7309 Other abnormal glucose: Secondary | ICD-10-CM | POA: Diagnosis not present

## 2022-02-03 DIAGNOSIS — M3500 Sicca syndrome, unspecified: Secondary | ICD-10-CM

## 2022-02-03 DIAGNOSIS — F419 Anxiety disorder, unspecified: Secondary | ICD-10-CM

## 2022-02-03 DIAGNOSIS — N816 Rectocele: Secondary | ICD-10-CM

## 2022-02-03 DIAGNOSIS — F3342 Major depressive disorder, recurrent, in full remission: Secondary | ICD-10-CM | POA: Diagnosis not present

## 2022-02-03 DIAGNOSIS — S80861A Insect bite (nonvenomous), right lower leg, initial encounter: Secondary | ICD-10-CM

## 2022-02-03 DIAGNOSIS — R6889 Other general symptoms and signs: Secondary | ICD-10-CM

## 2022-02-03 DIAGNOSIS — E559 Vitamin D deficiency, unspecified: Secondary | ICD-10-CM

## 2022-02-03 DIAGNOSIS — Q210 Ventricular septal defect: Secondary | ICD-10-CM

## 2022-02-03 DIAGNOSIS — Z0000 Encounter for general adult medical examination without abnormal findings: Secondary | ICD-10-CM

## 2022-02-03 DIAGNOSIS — M810 Age-related osteoporosis without current pathological fracture: Secondary | ICD-10-CM

## 2022-02-04 LAB — CBC WITH DIFFERENTIAL/PLATELET
Absolute Monocytes: 458 cells/uL (ref 200–950)
Basophils Absolute: 90 cells/uL (ref 0–200)
Basophils Relative: 1.2 %
Eosinophils Absolute: 113 cells/uL (ref 15–500)
Eosinophils Relative: 1.5 %
HCT: 37.4 % (ref 35.0–45.0)
Hemoglobin: 12.8 g/dL (ref 11.7–15.5)
Lymphs Abs: 2115 cells/uL (ref 850–3900)
MCH: 30.3 pg (ref 27.0–33.0)
MCHC: 34.2 g/dL (ref 32.0–36.0)
MCV: 88.6 fL (ref 80.0–100.0)
MPV: 9.5 fL (ref 7.5–12.5)
Monocytes Relative: 6.1 %
Neutro Abs: 4725 cells/uL (ref 1500–7800)
Neutrophils Relative %: 63 %
Platelets: 310 10*3/uL (ref 140–400)
RBC: 4.22 10*6/uL (ref 3.80–5.10)
RDW: 12.8 % (ref 11.0–15.0)
Total Lymphocyte: 28.2 %
WBC: 7.5 10*3/uL (ref 3.8–10.8)

## 2022-02-04 LAB — LIPID PANEL
Cholesterol: 176 mg/dL (ref <200)
HDL: 71 mg/dL (ref >=50)
LDL Cholesterol (Calc): 86 mg/dL (calc)
Non-HDL Cholesterol (Calc): 105 mg/dL (calc) (ref <130)
Total CHOL/HDL Ratio: 2.5 (calc) (ref <5.0)
Triglycerides: 94 mg/dL (ref <150)

## 2022-02-04 LAB — COMPLETE METABOLIC PANEL WITH GFR
AG Ratio: 1.9 (calc) (ref 1.0–2.5)
ALT: 17 U/L (ref 6–29)
AST: 16 U/L (ref 10–35)
Albumin: 4.6 g/dL (ref 3.6–5.1)
Alkaline phosphatase (APISO): 68 U/L (ref 37–153)
BUN: 12 mg/dL (ref 7–25)
CO2: 27 mmol/L (ref 20–32)
Calcium: 9.8 mg/dL (ref 8.6–10.4)
Chloride: 98 mmol/L (ref 98–110)
Creat: 0.7 mg/dL (ref 0.50–1.05)
Globulin: 2.4 g/dL (calc) (ref 1.9–3.7)
Glucose, Bld: 88 mg/dL (ref 65–99)
Potassium: 4.4 mmol/L (ref 3.5–5.3)
Sodium: 135 mmol/L (ref 135–146)
Total Bilirubin: 0.5 mg/dL (ref 0.2–1.2)
Total Protein: 7 g/dL (ref 6.1–8.1)
eGFR: 95 mL/min/{1.73_m2} (ref >=60)

## 2022-02-04 LAB — HEMOGLOBIN A1C
Hgb A1c MFr Bld: 5.5 % of total Hgb (ref <5.7)
Mean Plasma Glucose: 111 mg/dL
eAG (mmol/L): 6.2 mmol/L

## 2022-02-04 LAB — TSH: TSH: 3.53 mIU/L (ref 0.40–4.50)

## 2022-04-02 LAB — HM MAMMOGRAPHY

## 2022-04-02 LAB — HM DEXA SCAN

## 2022-05-08 ENCOUNTER — Ambulatory Visit
Admission: EM | Admit: 2022-05-08 | Discharge: 2022-05-08 | Disposition: A | Discharge status: Home / Self Care | Payer: Medicare Other | Attending: Urgent Care | Admitting: Urgent Care

## 2022-05-08 DIAGNOSIS — R1031 Right lower quadrant pain: Secondary | ICD-10-CM | POA: Diagnosis present

## 2022-05-08 DIAGNOSIS — R030 Elevated blood-pressure reading, without diagnosis of hypertension: Secondary | ICD-10-CM | POA: Insufficient documentation

## 2022-05-08 DIAGNOSIS — R109 Unspecified abdominal pain: Secondary | ICD-10-CM | POA: Diagnosis present

## 2022-05-08 LAB — POCT URINALYSIS DIP (MANUAL ENTRY)
Bilirubin, UA: NEGATIVE
Blood, UA: NEGATIVE
Glucose, UA: NEGATIVE mg/dL
Nitrite, UA: NEGATIVE
Spec Grav, UA: 1.015 (ref 1.010–1.025)
Urobilinogen, UA: 0.2 E.U./dL
pH, UA: 7 (ref 5.0–8.0)

## 2022-05-08 MED ORDER — ACETAMINOPHEN 325 MG PO TABS: 650.0000 mg | ORAL_TABLET | Freq: Four times a day (QID) | ORAL | 0 refills | Status: DC | PRN

## 2022-05-08 NOTE — ED Provider Notes (Signed)
[facility]  Note:  This document was prepared using Conservation officer, historic buildings and may include unintentional dictation errors.  MRN: [id number] DOB: 08-01-1955  Subjective:   Bianca Fernandez is a 66 y.o. female presenting for 10 day history of right sided flank pain, rlq abdominal pain. No dysuria, hematuria, vaginal discharge. Had 1 loose stool yesterday. No bloody stool. Has 2-3 bowel movements a week, unchanged with this past 10 days. Had colonoscopy 2021, was negative for polyps, diverticulosis. Had internal hemorrhoids. Has had uterine fibroids but nothing else. She did have an endometrial ablation and has not had recurrence of this. Pap smears have never lead to biopsies, last few were normal. Has an urologist that follows her for cystocele with prolapse. No history of renal colic.  No history appendicitis.  Still has her appendix.  No current facility-administered medications for this encounter.  Current Outpatient Medications:    acetaminophen (TYLENOL) 500 MG tablet, Take 1,000 mg by mouth every 6 (six) hours as needed for moderate pain or headache., Disp: , Rfl:    B Complex-C (SUPER B COMPLEX PO), Take 1 Dose by mouth 2 (two) times daily. , Disp: , Rfl:    Calcium Carbonate-Vitamin D (CALTRATE 600+D PO), Take 1 tablet by mouth daily. Takes 500 mg QD, Disp: , Rfl:    Cholecalciferol (VITAMIN D) 2000 UNITS tablet, Take 5,000 Units by mouth daily., Disp: , Rfl:    estradiol (ESTRACE) 0.1 MG/GM vaginal cream, Place 1 g vaginally 2 (two) times a week., Disp: , Rfl:    ibuprofen (ADVIL) 800 MG tablet, Take 800 mg by mouth every 6 (six) hours as needed for moderate pain. PRN, Disp: , Rfl:    Magnesium 400 MG TABS, Take 250 mg by mouth daily., Disp: , Rfl:    Omega 3-6-9 Fatty Acids (OMEGA 3-6-9 COMPLEX PO), Take 1 tablet by mouth daily with lunch. , Disp: , Rfl:    Allergies  Allergen Reactions   Estradiol     Patch caused itching     Past Medical  History:  Diagnosis Date   Anemia    Anxiety    situational   Arthritis    Closed trimalleolar fracture of ankle, left, initial encounter 11/15/2018   Complication of anesthesia    Depression    Family history of adverse reaction to anesthesia    Mother - nausea, daughter nausea   GERD (gastroesophageal reflux disease)    Heart murmur    Migraines    Osteoporosis    PONV (postoperative nausea and vomiting) 1976, 2001   Nausea    Sjogren's disease (HCC)    Unspecified vitamin D deficiency    VSD (ventricular septal defect), perimembranous      Past Surgical History:  Procedure Laterality Date   ABLATION ON ENDOMETRIOSIS     cervical conization  1976   COLONOSCOPY  2012   DILATION AND CURETTAGE OF UTERUS     x 2   ESOPHAGOGASTRODUODENOSCOPY  01/28/13   Dr Bosie Clos- dysmotility   lip biopsy  06/2011   + sjogrens, Dr. Carolan Clines at [facility]   ORIF ANKLE FRACTURE Left 11/17/2018   Procedure: OPEN REDUCTION INTERNAL FIXATION (ORIF) LEFT ANKLE FRACTURE;  Surgeon: Nadara Mustard, MD;  Location: [facility] OR;  Service: Orthopedics;  Laterality: Left;    Family History  Problem Relation Age of Onset   Hypertension Mother    Stroke Mother    Valvular heart disease Mother    Hypertension Father  Stroke Father    Bladder Cancer Father        worked in Physiological scientist   Uterine cancer Maternal Grandmother    Ovarian cancer Maternal Aunt    Breast cancer Cousin    Multiple sclerosis Cousin    Parkinson's disease Cousin    ALS Other     Social History   Tobacco Use   Smoking status: Never   Smokeless tobacco: Never  Vaping Use   Vaping Use: Never used  Substance Use Topics   Alcohol use: Not Currently   Drug use: No    ROS   Objective:   Vitals: BP (!) 180/102 (BP Location: Right Arm)   Pulse 86   Temp 98.7 F (37.1 C) (Oral)   Resp 18   SpO2 95%   BP Readings from Last 3 Encounters:  05/08/22 (!) 180/102  02/03/22 (!) 140/70  10/18/21 115/67   BP 165/104.  Physical  Exam Constitutional:      General: She is not in acute distress.    Appearance: Normal appearance. She is well-developed. She is not ill-appearing, toxic-appearing or diaphoretic.  HENT:     Head: Normocephalic and atraumatic.     Nose: Nose normal.     Mouth/Throat:     Mouth: Mucous membranes are moist.  Eyes:     General: No scleral icterus.       Right eye: No discharge.        Left eye: No discharge.     Extraocular Movements: Extraocular movements intact.     Conjunctiva/sclera: Conjunctivae normal.  Cardiovascular:     Rate and Rhythm: Normal rate and regular rhythm.     Heart sounds: Normal heart sounds. No murmur heard.    No friction rub. No gallop.  Pulmonary:     Effort: Pulmonary effort is normal. No respiratory distress.     Breath sounds: No stridor. No wheezing, rhonchi or rales.  Chest:     Chest wall: No tenderness.  Abdominal:     General: Bowel sounds are normal. There is no distension.     Palpations: Abdomen is soft. There is no mass.     Tenderness: There is no abdominal tenderness. There is no right CVA tenderness, left CVA tenderness, guarding or rebound.  Skin:    General: Skin is warm and dry.  Neurological:     General: No focal deficit present.     Mental Status: She is alert and oriented to person, place, and time.  Psychiatric:        Mood and Affect: Mood normal.        Behavior: Behavior normal.        Thought Content: Thought content normal.        Judgment: Judgment normal.    Results for orders placed or performed during the hospital encounter of 05/08/22 (from the past 24 hour(s))  POCT urinalysis dipstick     Status: Abnormal   Collection Time: 05/08/22  1:04 PM  Result Value Ref Range   Color, UA yellow yellow   Clarity, UA clear clear   Glucose, UA negative negative mg/dL   Bilirubin, UA negative negative   Ketones, POC UA small (15) (A) negative mg/dL   Spec Grav, UA 4.098 1.191 - 1.025   Blood, UA negative negative   pH, UA  7.0 5.0 - 8.0   Protein Ur, POC trace (A) negative mg/dL   Urobilinogen, UA 0.2 0.2 or 1.0 E.U./dL   Nitrite, UA Negative Negative  Leukocytes, UA Small (1+) (A) Negative    Assessment and Plan :   PDMP not reviewed this encounter.  1. Right lower quadrant abdominal pain   2. Right flank pain   3. Elevated blood pressure reading without diagnosis of hypertension     No pain elicited on exam.  An abdominal series would be of low yield.  Patient may benefit from a CT of the abdomen pelvis.  I do not believe that there is sign of acute abdomen.  Regarding her blood pressure, patient states that she has whitecoat syndrome and normally she checks her blood pressure at home and is very normal.  She plans to do so and monitor for this.  In the meantime, use Tylenol for pain relief. Counseled patient on potential for adverse effects with medications prescribed/recommended today, ER and return-to-clinic precautions discussed, patient verbalized understanding.    Wallis Bamberg, PA-C 05/08/22 1315

## 2022-05-08 NOTE — ED Triage Notes (Signed)
Pt c/o RLQ pian and right flank pain started 12/18-watery stool x1 two days ago-denies n/v-no change in urinary sx-NAD-steady gait

## 2022-05-08 NOTE — Discharge Instructions (Addendum)
Please make sure that you set up an appointment for follow-up with your regular doctor.  They may be able to help you pursue an outpatient CT scan to further evaluate the abdominal pain that you are having.  If you develop a high fever and the pain comes on and is not improving with the use of Tylenol then please do not wait to be seen by your PCP, or go to the emergency room.

## 2022-05-09 LAB — URINE CULTURE: Culture: <10000 — AB

## 2022-05-20 ENCOUNTER — Ambulatory Visit (INDEPENDENT_AMBULATORY_CARE_PROVIDER_SITE_OTHER): Payer: Medicare Other | Admitting: Nurse Practitioner

## 2022-05-20 ENCOUNTER — Encounter: Payer: Self-pay | Admitting: Nurse Practitioner

## 2022-05-20 VITALS — BP 152/78 | HR 89 | Temp 97.5°F | Ht 68.0 in | Wt 175.4 lb

## 2022-05-20 DIAGNOSIS — M545 Low back pain, unspecified: Secondary | ICD-10-CM | POA: Diagnosis not present

## 2022-05-20 DIAGNOSIS — R030 Elevated blood-pressure reading, without diagnosis of hypertension: Secondary | ICD-10-CM | POA: Diagnosis not present

## 2022-05-20 MED ORDER — TIZANIDINE HCL 2 MG PO TABS: 2.0000 mg | ORAL_TABLET | Freq: Four times a day (QID) | ORAL | 0 refills | Status: DC | PRN

## 2022-05-20 NOTE — Progress Notes (Signed)
Assessment and Plan: Bianca Fernandez was seen today for back pain and flank pain.  Diagnoses and all orders for this visit:  Elevated blood pressure reading without diagnosis of hypertension - History of white coat hypertension, BP's running in normal range at home - continue DASH diet, exercise and monitor at home. Call if greater than 130/80.   Right lumbar pain Very intermittent. Not bad today Cannot reproduce pain Advised next time pain is occurring to try Zanaflex to see if this helps resolve the pain If pain worsens notify the office If develop severe abdominal pain, nausea/vomiting, fever she is to go to the ER -     tiZANidine (ZANAFLEX) 2 MG tablet; Take 1 tablet (2 mg total) by mouth every 6 (six) hours as needed for muscle spasms.       Further disposition pending results of labs. Discussed med's effects and SE's.   Over 30 minutes of exam, counseling, chart review, and critical decision making was performed.   Future Appointments  Date Time Provider Department Center  07/23/2022 11:00 AM Lucky Cowboy, MD [facility] None  02/04/2023 11:00 AM Raynelle Dick, NP [facility] None    ------------------------------------------------------------------------------------------------------------------   HPI BP (!) 152/78   Pulse 89   Temp (!) 97.5 F (36.4 C)   Ht 5\' 8"  (1.727 m)   Wt 175 lb 6.4 oz (79.6 kg)   SpO2 97%   BMI 26.67 kg/m    66 y.o.female presents for complaints on right sided flank and low back pain Has been occurring intermittently since 04/28/22. Pain will start in right lower abdomen and radiate to her right lower back.  Very intermittent and nothing makes it worse or better. She is questioning if pain could be related to anxiety.  She is under a lot of stress- father-in-law in hospice.       Was seen in the ER on 05/08/22:  1. Right lower quadrant abdominal pain   2. Right flank pain   3. Elevated blood pressure reading without diagnosis of  hypertension    No pain elicited on exam.  An abdominal series would be of low yield.  Patient may benefit from a CT of the abdomen pelvis.  I do not believe that there is sign of acute abdomen.  Regarding her blood pressure, patient states that she has whitecoat syndrome and normally she checks her blood pressure at home and is very normal.  She plans to do so and monitor for this.  In the meantime, use Tylenol for pain relief. Counseled patient on potential for adverse effects with medications prescribed/recommended today, ER and return-to-clinic precautions discussed, patient verbalized understanding.    BP at home today was 127/77, she has white coat hypertension.  She is very anxious about her daughter who is in [place] in the ER for possible C. Diff.  BP runs normal at home.  Denies chest pain, shortness of breath , dizziness and headaches BP Readings from Last 3 Encounters:  05/20/22 (!) 152/78  05/08/22 (!) 180/102  02/03/22 (!) 140/70    BMI is Body mass index is 26.67 kg/m., she has been working on diet and exercise. Wt Readings from Last 3 Encounters:  05/20/22 175 lb 6.4 oz (79.6 kg)  02/03/22 177 lb 6.4 oz (80.5 kg)  09/03/21 179 lb 6.4 oz (81.4 kg)     Past Medical History:  Diagnosis Date   Anemia    Anxiety    situational   Arthritis    Closed trimalleolar fracture of ankle,  left, initial encounter 10/17/6718   Complication of anesthesia    Depression    Family history of adverse reaction to anesthesia    Mother - nausea, daughter nausea   GERD (gastroesophageal reflux disease)    Heart murmur    Migraines    Osteoporosis    PONV (postoperative nausea and vomiting) 1976, 2001   Nausea    Sjogren's disease ([place])    Unspecified vitamin D deficiency    VSD (ventricular septal defect), perimembranous      Allergies  Allergen Reactions   Estradiol     Patch caused itching     Current Outpatient Medications on File Prior to Visit  Medication Sig    acetaminophen (TYLENOL) 325 MG tablet Take 2 tablets (650 mg total) by mouth every 6 (six) hours as needed for moderate pain.   B Complex-C (SUPER B COMPLEX PO) Take 1 Dose by mouth 2 (two) times daily.    Calcium Carbonate-Vitamin D (CALTRATE 600+D PO) Take 1 tablet by mouth daily. Takes 500 mg QD   Cholecalciferol (VITAMIN D) 2000 UNITS tablet Take 5,000 Units by mouth daily.   estradiol (ESTRACE) 0.1 MG/GM vaginal cream Place 1 g vaginally 2 (two) times a week.   ibuprofen (ADVIL) 800 MG tablet Take 800 mg by mouth every 6 (six) hours as needed for moderate pain. PRN   Magnesium 400 MG TABS Take 250 mg by mouth daily.   Omega 3-6-9 Fatty Acids (OMEGA 3-6-9 COMPLEX PO) Take 1 tablet by mouth daily with lunch.    No current facility-administered medications on file prior to visit.    ROS: all negative except above.   Physical Exam:  BP (!) 152/78   Pulse 89   Temp (!) 97.5 F (36.4 C)   Ht 5\' 8"  (1.727 m)   Wt 175 lb 6.4 oz (79.6 kg)   SpO2 97%   BMI 26.67 kg/m   General Appearance: Well nourished, in no apparent distress. Eyes: PERRLA, EOMs, conjunctiva no swelling or erythema Sinuses: No Frontal/maxillary tenderness ENT/Mouth: Ext aud canals clear, TMs without erythema, bulging. No erythema, swelling, or exudate on post pharynx.  Tonsils not swollen or erythematous. Hearing normal.  Neck: Supple, thyroid normal.  Respiratory: Respiratory effort normal, BS equal bilaterally without rales, rhonchi, wheezing or stridor.  Cardio: RRR with no MRGs. Brisk peripheral pulses without edema.  Abdomen: Soft, + BS.  Non tender, no guarding, rebound, hernias, masses. Lymphatics: Non tender without lymphadenopathy.  Musculoskeletal: Full ROM, 5/5 strength, normal gait. Unable to reproduce any back pain Skin: Warm, dry without rashes, lesions, ecchymosis.  Neuro: Cranial nerves intact. Normal muscle tone, no cerebellar symptoms. Sensation intact.  Psych: Awake and oriented X 3, normal  affect, Insight and Judgment appropriate.     Alycia Rossetti, NP 10:33 AM [facility]

## 2022-07-18 ENCOUNTER — Encounter: Payer: Medicare Other | Admitting: Adult Health

## 2022-07-23 ENCOUNTER — Encounter: Payer: Medicare Other | Admitting: Internal Medicine

## 2022-08-31 NOTE — Progress Notes (Signed)
Comprehensive Evaluation &  Examination   Future Appointments  Date Time Provider Department  09/01/2022 10:00 AM Lucky Cowboy, MD [facility]  02/04/2023 11:00 AM Raynelle Dick, NP [facility]  09/10/2023 10:00 AM Lucky Cowboy, MD [facility]        This very nice 67 y.o.  MWF presents for comprehensive evaluation and management of multiple medical co-morbidities.  Patient has been followed for Hx/o labile elevated BP,    HLD, hx/o elevated glucose  and Vitamin D Deficiency.Patient is monitored  by Dr Kathi Ludwig for Sjogren's Syndrome. Patient also has hx/o Migraine . Patient has hx/o Osteoporosis followed by Dr Jennette Kettle ( now retired ) & Dr Kathi Ludwig & patient has declined recommended Bisphosphonate therapy.          Patient has been followed expectantly with labile elevated BP . Patient's BP has been controlled at home and patient denies any cardiac symptoms as chest pain, palpitations, shortness of breath, dizziness or ankle swelling.  Patient has hx/o VSD & is followed by Dr Royann Shivers.  [facility] Cardiac echo in May 2023 showed small inconsequential VSD. Today's BP was elevated at 170/90 & confirmed.   A list of home BP over the last 2 weeks ranged 100- 122/72-74 .        Patient's hyperlipidemia  (LDL "106"/2019,   "108" /2020 and "105" /2022)  is controlled with diet . Last lipids were at goal :  Lab Results  Component Value Date   CHOL 176 02/03/2022   HDL 71 02/03/2022   LDLCALC 86 02/03/2022   TRIG 94 02/03/2022   CHOLHDL 2.5 02/03/2022         Patient has hx/o elevated glucose  (A1c 5.7% /Mar 2022)  and patient denies reactive hypoglycemic symptoms, visual blurring, diabetic polys or paresthesias. Last A1c was normal & at goal :  Lab Results  Component Value Date   HGBA1C 5.5 02/03/2022         Finally, patient has history of Vitamin D Deficiency  (34"  /2021 and "35"  /2019") and last Vitamin D was still slightly low  (goal 70-100) :  Lab Results  Component Value Date    VD25OH 50 07/17/2021     Current Outpatient Medications on File Prior to Visit  Medication Sig   acetaminophen 325 MG tablet Take 2 tablets  every 6  hours as needed    SUPER B COMPLEX  Take 1  2  times daily.    CALTRATE 600+D  Takes 500 mg QD   VITAMIN D 2000 UNITS tablet Take 5,000 Units by mouth daily.   estradiol (ESTRACE) 0.1 MG/GM vaginal cream Place 1 g vaginally 2 (two) times a week.   ibuprofen (ADVIL) 800 MG tablet Take  every 6 hours as needed   Magnesium  250 mg   Take daily.   Omega 3-6-9 OMEGA 3-6-9  Take 1 tablet daily    tiZANidine (ZANAFLEX) 2 MG tablet Take 1 tablet every 6  hours as needed for muscle spasms.     Allergies  Allergen Reactions   Estradiol     Patch caused itching      Past Medical History:  Diagnosis Date   Anemia    Anxiety    situational   Arthritis    Closed trimalleolar fracture of ankle, left, initial encounter 11/15/2018   Complication of anesthesia    Depression    Family history of adverse reaction to anesthesia    Mother - nausea, daughter nausea  GERD (gastroesophageal reflux disease)    Heart murmur    Migraines    Osteoporosis    PONV (postoperative nausea and vomiting) 1976, 2001   Nausea    Sjogren's disease (HCC)    Unspecified vitamin D deficiency    VSD (ventricular septal defect), perimembranous      Health Maintenance  Topic Date Due   DEXA SCAN  09/04/2021   MAMMOGRAM  02/27/2022   COVID-19 Vaccine (7 - 2023-24 season) 04/30/2022   INFLUENZA VACCINE  12/11/2022   Medicare Annual Wellness (AWV)  02/04/2023   COLONOSCOPY (Pts 45-51yrs Insurance coverage will need to be confirmed)  07/11/2029   DTaP/Tdap/Td (3 - Td or Tdap) 07/14/2030   Pneumonia Vaccine 73+ Years old  Completed   Hepatitis C Screening  Completed   Zoster Vaccines- Shingrix  Completed   HPV VACCINES  Aged Out     Immunization History  Administered Date(s) Administered   Influenza, High Dose 02/10/2019, 02/20/2021   Influenza  05/26/2016   Influenza 01/25/2013, 02/09/2014, 02/08/2020, 03/05/2022   PFIZER Covid-19 Vacc 09/05/2020, 03/02/2021   PFIZER-SARS-COV-2 Vacc 07/04/2019, 07/25/2019, 02/08/2020   PNEUMOCOCCAL- 20 07/17/2021   PPD Test 04/19/2013, 05/17/2014   Pneumococcal -13 10/20/2019   Pneumococcal -23 04/19/2013   Td 07/13/2020   Tdap 08/20/2010   SARS-COV-2 Vacc 02/20/2021, 03/05/2022   Zoster Recombinat (Shingrix) 07/11/2021, 10/08/2021    Last Colon - 07/12/2019 - Dr Charlott Rakes - Recc 10 year f/u  - due Mar 2031 .   Last MGM - Alleges current  03/2022 at Dr Vinnie [facility]  Last BMD  - Alleges current 03/2022 at Dr Vinnie Langton [facility] office   Past Surgical History:  Procedure Laterality Date   ABLATION ON ENDOMETRIOSIS     cervical conization  1976   COLONOSCOPY  2012   DILATION AND CURETTAGE OF UTERUS     x 2   ESOPHAGOGASTRODUODENOSCOPY  01/28/13   Dr Bosie Clos- dysmotility   lip biopsy  06/2011   + sjogrens, Dr. Carolan Clines at [facility]   ORIF ANKLE FRACTURE Left 11/17/2018   Procedure: OPEN REDUCTION INTERNAL FIXATION (ORIF) LEFT ANKLE FRACTURE;  Surgeon: Nadara Mustard, MD;  Location: [facility] OR;  Service: Orthopedics;  Laterality: Left;     Family History  Problem Relation Age of Onset   Hypertension Mother    Stroke Mother    Valvular heart disease Mother    Hypertension Father    Stroke Father    Bladder Cancer Father        worked in Physiological scientist   Uterine cancer Maternal Grandmother    Ovarian cancer Maternal Aunt    Breast cancer Cousin    Multiple sclerosis Cousin    Parkinson's disease Cousin    ALS Other      Social History   Tobacco Use   Smoking status: Never   Smokeless tobacco: Never  Vaping Use   Vaping Use: Never used  Substance Use Topics   Alcohol use: Not Currently   Drug use: No      ROS Constitutional: Denies fever, chills, weight loss/gain, headaches, insomnia,  night sweats, and change in appetite. Does c/o fatigue. Eyes: Denies  redness, blurred vision, diplopia, discharge, itchy, watery eyes.  ENT: Denies discharge, congestion, post nasal drip, epistaxis, sore throat, earache, hearing loss, dental pain, Tinnitus, Vertigo, Sinus pain, snoring.  Cardio: Denies chest pain, palpitations, irregular heartbeat, syncope, dyspnea, diaphoresis, orthopnea, PND, claudication, edema Respiratory: denies cough, dyspnea, DOE, pleurisy, hoarseness, laryngitis, wheezing.  Gastrointestinal:  Denies dysphagia, heartburn, reflux, water brash, pain, cramps, nausea, vomiting, bloating, diarrhea, constipation, hematemesis, melena, hematochezia, jaundice, hemorrhoids Genitourinary: Denies dysuria, frequency, urgency, nocturia, hesitancy, discharge, hematuria, flank pain Breast: Breast lumps, nipple discharge, bleeding.  Musculoskeletal: Denies arthralgia, myalgia, stiffness, Jt. Swelling, pain, limp, and strain/sprain. Denies falls. Skin: Denies puritis, rash, hives, warts, acne, eczema, changing in skin lesion Neuro: No weakness, tremor, incoordination, spasms, paresthesia, pain Psychiatric: Denies confusion, memory loss, sensory loss. Denies Depression. Endocrine: Denies change in weight, skin, hair change, nocturia, and paresthesia, diabetic polys, visual blurring, hyper / hypo glycemic episodes.  Heme/Lymph: No excessive bleeding, bruising, enlarged lymph nodes.  Physical Exam  BP (!) 170/90   Pulse 93   Temp 97.9 F (36.6 C)   Resp 16   Ht 5\' 8"  (1.727 m)   Wt 174 lb 3.2 oz (79 kg)   SpO2 99%   BMI 26.49 kg/m   General Appearance: Well nourished, well groomed and in no apparent distress.  Eyes: PERRLA, EOMs, conjunctiva no swelling or erythema, normal fundi and vessels. Sinuses: No frontal/maxillary tenderness ENT/Mouth: EACs patent / TMs  nl. Nares clear without erythema, swelling, mucoid exudates. Oral hygiene is good. No erythema, swelling, or exudate. Tongue normal, non-obstructing. Tonsils not swollen or erythematous.  Hearing normal.  Neck: Supple, thyroid not palpable. No bruits, nodes or JVD. Respiratory: Respiratory effort normal.  BS equal and clear bilateral without rales, rhonci, wheezing or stridor. Cardio: Heart sounds are normal with regular rate and rhythm and no murmurs, rubs or gallops. Peripheral pulses are normal and equal bilaterally without edema. No aortic or femoral bruits. Chest: symmetric with normal excursions and percussion. Breasts: Deferred to [facility].  Abdomen: Flat, soft with bowel sounds active. Nontender, no guarding, rebound, hernias, masses, or organomegaly.  Lymphatics: Non tender without lymphadenopathy.  Musculoskeletal: Full ROM all peripheral extremities, joint stability, 5/5 strength, and normal gait. Skin: Warm and dry without rashes, lesions, cyanosis, clubbing or  ecchymosis.  Neuro: Cranial nerves intact, reflexes equal bilaterally. Normal muscle tone, no cerebellar symptoms. Sensation intact.  Pysch: Alert and oriented X 3, normal affect, Insight and Judgment appropriate.    Assessment and Plan  1. Encounter for routine adult health examination with abnormal findings  2. Labile hypertension  - EKG 12-Lead - Urinalysis, Routine w reflex microscopic - Microalbumin / creatinine urine ratio - CBC with Differential/Platelet - COMPLETE METABOLIC PANEL WITH GFR - Magnesium - TSH  3. Statin intolerance  - EKG 12-Lead - Lipid panel  4. Hyperlipidemia, mixed  - EKG 12-Lead - Lipid panel - TSH  5. Abnormal glucose  - EKG 12-Lead - Hemoglobin A1c - Insulin, random  6. Vitamin D deficiency  - VITAMIN D 25 Hydroxy  7. VSD (ventricular septal defect), perimembranous  - EKG 12-Lead  8. Other osteoporosis without current pathological fracture  - COMPLETE METABOLIC PANEL WITH GFR  9. Sjogren's syndrome   10. Depression, major, recurrent, in complete remission  - TSH  11. Screening for colorectal cancer  - POC Hemoccult Bld/Stl  12. Screening for  heart disease  - EKG 12-Lead  13. FHx: heart disease  - EKG 12-Lead  14. Medication management  - Microalbumin / creatinine urine ratio - CBC with Differential/Platelet - COMPLETE METABOLIC PANEL WITH GFR - Magnesium - Lipid panel - Hemoglobin A1c - TSH - Insulin, random - VITAMIN D 25 Hydroxyl           Patient was counseled in prudent diet to achieve/maintain BMI less than 25 for weight control,  BP monitoring, regular exercise and medications. Discussed med's effects and SE's. Screening labs and tests as requested with regular follow-up as recommended. Over 40 minutes of exam, counseling, chart review and high complex critical decision making was performed.   Marinus Maw, MD

## 2022-08-31 NOTE — Progress Notes (Incomplete)
Annual Screening/Preventative Visit &  Comprehensive Evaluation &  Examination  Future Appointments  Date Time Provider Department  09/01/2022 10:00 AM Lucky Cowboy, MD [facility]  02/04/2023 11:00 AM Raynelle Dick, NP [facility]  09/10/2023 10:00 AM Lucky Cowboy, MD [facility]        This very nice 67 y.o.female presents for a Screening /Preventative Visit & comprehensive evaluation and management of multiple medical co-morbidities.  Patient has been followed for HTN, HLD, T2_NIDDM  Prediabetes  and Vitamin D Deficiency.        HTN predates since     . Patient's BP has been controlled at home and patient denies any cardiac symptoms as chest pain, palpitations, shortness of breath, dizziness or ankle swelling. Today's          Patient's hyperlipidemia is controlled with diet and medications. Patient denies myalgias or other medication SE's. Last lipids were   Lab Results  Component Value Date   CHOL 176 02/03/2022   HDL 71 02/03/2022   LDLCALC 86 02/03/2022   TRIG 94 02/03/2022   CHOLHDL 2.5 02/03/2022         Patient has hx/o T2_NIDDM prediabetes predating since      and patient denies reactive hypoglycemic symptoms, visual blurring, diabetic polys or paresthesias. Last A1c was   Lab Results  Component Value Date   HGBA1C 5.5 02/03/2022         Finally, patient has history of Vitamin D Deficiency and last Vitamin D was   Lab Results  Component Value Date   VD25OH 74 07/17/2021     Current Outpatient Medications on File Prior to Visit  Medication Sig  . acetaminophen (TYLENOL) 325 MG tablet Take 2 tablets (650 mg total) by mouth every 6 (six) hours as needed for moderate pain.  . B Complex-C (SUPER B COMPLEX PO) Take 1 Dose by mouth 2 (two) times daily.   . Calcium Carbonate-Vitamin D (CALTRATE 600+D PO) Take 1 tablet by mouth daily. Takes 500 mg QD  . Cholecalciferol (VITAMIN D) 2000 UNITS tablet Take 5,000 Units by mouth daily.  Marland Kitchen estradiol (ESTRACE)  0.1 MG/GM vaginal cream Place 1 g vaginally 2 (two) times a week.  Marland Kitchen ibuprofen (ADVIL) 800 MG tablet Take 800 mg by mouth every 6 (six) hours as needed for moderate pain. PRN  . Magnesium 400 MG TABS Take 250 mg by mouth daily.  . Omega 3-6-9 Fatty Acids (OMEGA 3-6-9 COMPLEX PO) Take 1 tablet by mouth daily with lunch.   Marland Kitchen tiZANidine (ZANAFLEX) 2 MG tablet Take 1 tablet (2 mg total) by mouth every 6 (six) hours as needed for muscle spasms.   No current facility-administered medications on file prior to visit.     Allergies  Allergen Reactions  . Estradiol     Patch caused itching      Past Medical History:  Diagnosis Date  . Anemia   . Anxiety    situational  . Arthritis   . Closed trimalleolar fracture of ankle, left, initial encounter 11/15/2018  . Complication of anesthesia   . Depression   . Family history of adverse reaction to anesthesia    Mother - nausea, daughter nausea  . GERD (gastroesophageal reflux disease)   . Heart murmur   . Migraines   . Osteoporosis   . PONV (postoperative nausea and vomiting) 1976, 2001   Nausea   . Sjogren's disease (HCC)   . Unspecified vitamin D deficiency   . VSD (ventricular septal defect), perimembranous  Health Maintenance  Topic Date Due  . DEXA SCAN  09/04/2021  . MAMMOGRAM  02/27/2022  . COVID-19 Vaccine (7 - 2023-24 season) 04/30/2022  . INFLUENZA VACCINE  12/11/2022  . Medicare Annual Wellness (AWV)  02/04/2023  . COLONOSCOPY (Pts 45-22yrs Insurance coverage will need to be confirmed)  07/11/2029  . DTaP/Tdap/Td (3 - Td or Tdap) 07/14/2030  . Pneumonia Vaccine 48+ Years old  Completed  . Hepatitis C Screening  Completed  . Zoster Vaccines- Shingrix  Completed  . HPV VACCINES  Aged Out     Immunization History  Administered Date(s) Administered  . Influenza, High Dose Seasonal PF 02/10/2019, 02/20/2021  . Influenza, Seasonal, Injecte, Preservative Fre 05/26/2016  . Influenza-Unspecified 01/25/2013,  02/09/2014, 02/08/2020, 03/05/2022  . PFIZER Comirnaty(Gray Top)Covid-19 Tri-Sucrose Vaccine 09/05/2020, 03/02/2021  . PFIZER(Purple Top)SARS-COV-2 Vaccination 07/04/2019, 07/25/2019, 02/08/2020  . PNEUMOCOCCAL CONJUGATE-20 07/17/2021  . PPD Test 04/19/2013, 05/17/2014  . Pneumococcal Conjugate-13 10/20/2019  . Pneumococcal Polysaccharide-23 04/19/2013  . Td 07/13/2020  . Tdap 08/20/2010  . Unspecified SARS-COV-2 Vaccination 02/20/2021, 03/05/2022  . Zoster Recombinat (Shingrix) 07/11/2021, 10/08/2021     Last Colon -    Last MGM -    Past Surgical History:  Procedure Laterality Date  . ABLATION ON ENDOMETRIOSIS    . cervical conization  1976  . COLONOSCOPY  2012  . DILATION AND CURETTAGE OF UTERUS     x 2  . ESOPHAGOGASTRODUODENOSCOPY  01/28/13   Dr Bosie Clos- dysmotility  . lip biopsy  06/2011   + sjogrens, Dr. Carolan Clines at [facility]  . ORIF ANKLE FRACTURE Left 11/17/2018   Procedure: OPEN REDUCTION INTERNAL FIXATION (ORIF) LEFT ANKLE FRACTURE;  Surgeon: Nadara Mustard, MD;  Location: [facility] OR;  Service: Orthopedics;  Laterality: Left;     Family History  Problem Relation Age of Onset  . Hypertension Mother   . Stroke Mother   . Valvular heart disease Mother   . Hypertension Father   . Stroke Father   . Bladder Cancer Father        worked in Physiological scientist  . Uterine cancer Maternal Grandmother   . Ovarian cancer Maternal Aunt   . Breast cancer Cousin   . Multiple sclerosis Cousin   . Parkinson's disease Cousin   . ALS Other      Social History   Tobacco Use  . Smoking status: Never  . Smokeless tobacco: Never  Vaping Use  . Vaping Use: Never used  Substance Use Topics  . Alcohol use: Not Currently  . Drug use: No      ROS Constitutional: Denies fever, chills, weight loss/gain, headaches, insomnia,  night sweats, and change in appetite. Does c/o fatigue. Eyes: Denies redness, blurred vision, diplopia, discharge, itchy, watery eyes.  ENT: Denies discharge,  congestion, post nasal drip, epistaxis, sore throat, earache, hearing loss, dental pain, Tinnitus, Vertigo, Sinus pain, snoring.  Cardio: Denies chest pain, palpitations, irregular heartbeat, syncope, dyspnea, diaphoresis, orthopnea, PND, claudication, edema Respiratory: denies cough, dyspnea, DOE, pleurisy, hoarseness, laryngitis, wheezing.  Gastrointestinal: Denies dysphagia, heartburn, reflux, water brash, pain, cramps, nausea, vomiting, bloating, diarrhea, constipation, hematemesis, melena, hematochezia, jaundice, hemorrhoids Genitourinary: Denies dysuria, frequency, urgency, nocturia, hesitancy, discharge, hematuria, flank pain Breast: Breast lumps, nipple discharge, bleeding.  Musculoskeletal: Denies arthralgia, myalgia, stiffness, Jt. Swelling, pain, limp, and strain/sprain. Denies falls. Skin: Denies puritis, rash, hives, warts, acne, eczema, changing in skin lesion Neuro: No weakness, tremor, incoordination, spasms, paresthesia, pain Psychiatric: Denies confusion, memory loss, sensory loss. Denies Depression. Endocrine: Denies change in weight,  skin, hair change, nocturia, and paresthesia, diabetic polys, visual blurring, hyper / hypo glycemic episodes.  Heme/Lymph: No excessive bleeding, bruising, enlarged lymph nodes.  Physical Exam  There were no vitals taken for this visit.  General Appearance: Well nourished, well groomed and in no apparent distress.  Eyes: PERRLA, EOMs, conjunctiva no swelling or erythema, normal fundi and vessels. Sinuses: No frontal/maxillary tenderness ENT/Mouth: EACs patent / TMs  nl. Nares clear without erythema, swelling, mucoid exudates. Oral hygiene is good. No erythema, swelling, or exudate. Tongue normal, non-obstructing. Tonsils not swollen or erythematous. Hearing normal.  Neck: Supple, thyroid not palpable. No bruits, nodes or JVD. Respiratory: Respiratory effort normal.  BS equal and clear bilateral without rales, rhonci, wheezing or  stridor. Cardio: Heart sounds are normal with regular rate and rhythm and no murmurs, rubs or gallops. Peripheral pulses are normal and equal bilaterally without edema. No aortic or femoral bruits. Chest: symmetric with normal excursions and percussion. Breasts: Symmetric, without lumps, nipple discharge, retractions, or fibrocystic changes.  Abdomen: Flat, soft with bowel sounds active. Nontender, no guarding, rebound, hernias, masses, or organomegaly.  Lymphatics: Non tender without lymphadenopathy.  Genitourinary:  Musculoskeletal: Full ROM all peripheral extremities, joint stability, 5/5 strength, and normal gait. Skin: Warm and dry without rashes, lesions, cyanosis, clubbing or  ecchymosis.  Neuro: Cranial nerves intact, reflexes equal bilaterally. Normal muscle tone, no cerebellar symptoms. Sensation intact.  Pysch: Alert and oriented X 3, normal affect, Insight and Judgment appropriate.    Assessment and Plan  1. Annual Preventative Screening Examination            Patient was counseled in prudent diet to achieve/maintain BMI less than 25 for weight control, BP monitoring, regular exercise and medications. Discussed med's effects and SE's. Screening labs and tests as requested with regular follow-up as recommended. Over 40 minutes of exam, counseling, chart review and high complex critical decision making was performed.   Marinus Maw, MD

## 2022-09-01 ENCOUNTER — Ambulatory Visit (INDEPENDENT_AMBULATORY_CARE_PROVIDER_SITE_OTHER): Payer: Medicare Other | Admitting: Internal Medicine

## 2022-09-01 ENCOUNTER — Encounter: Payer: Self-pay | Admitting: Internal Medicine

## 2022-09-01 VITALS — BP 170/90 | HR 93 | Temp 97.9°F | Resp 16 | Ht 68.0 in | Wt 174.2 lb

## 2022-09-01 DIAGNOSIS — R0989 Other specified symptoms and signs involving the circulatory and respiratory systems: Secondary | ICD-10-CM

## 2022-09-01 DIAGNOSIS — Z789 Other specified health status: Secondary | ICD-10-CM

## 2022-09-01 DIAGNOSIS — M818 Other osteoporosis without current pathological fracture: Secondary | ICD-10-CM

## 2022-09-01 DIAGNOSIS — Z8249 Family history of ischemic heart disease and other diseases of the circulatory system: Secondary | ICD-10-CM

## 2022-09-01 DIAGNOSIS — R7309 Other abnormal glucose: Secondary | ICD-10-CM | POA: Diagnosis not present

## 2022-09-01 DIAGNOSIS — Z136 Encounter for screening for cardiovascular disorders: Secondary | ICD-10-CM

## 2022-09-01 DIAGNOSIS — E559 Vitamin D deficiency, unspecified: Secondary | ICD-10-CM

## 2022-09-01 DIAGNOSIS — E782 Mixed hyperlipidemia: Secondary | ICD-10-CM | POA: Diagnosis not present

## 2022-09-01 DIAGNOSIS — Z79899 Other long term (current) drug therapy: Secondary | ICD-10-CM

## 2022-09-01 DIAGNOSIS — Z1211 Encounter for screening for malignant neoplasm of colon: Secondary | ICD-10-CM

## 2022-09-01 DIAGNOSIS — Z0001 Encounter for general adult medical examination with abnormal findings: Secondary | ICD-10-CM

## 2022-09-01 DIAGNOSIS — Q210 Ventricular septal defect: Secondary | ICD-10-CM

## 2022-09-01 DIAGNOSIS — F3342 Major depressive disorder, recurrent, in full remission: Secondary | ICD-10-CM

## 2022-09-01 DIAGNOSIS — M3500 Sicca syndrome, unspecified: Secondary | ICD-10-CM

## 2022-09-01 NOTE — Patient Instructions (Signed)

## 2022-09-02 ENCOUNTER — Other Ambulatory Visit: Payer: Self-pay | Admitting: Internal Medicine

## 2022-09-02 LAB — URINALYSIS, ROUTINE W REFLEX MICROSCOPIC
Bilirubin Urine: NEGATIVE
Glucose, UA: NEGATIVE
Hgb urine dipstick: NEGATIVE
Hyaline Cast: NONE SEEN /LPF
Ketones, ur: NEGATIVE
Nitrite: NEGATIVE
Protein, ur: NEGATIVE
RBC / HPF: NONE SEEN /HPF (ref 0–2)
Specific Gravity, Urine: 1.012 (ref 1.001–1.035)
WBC, UA: NONE SEEN /HPF (ref 0–5)
pH: 6.5 (ref 5.0–8.0)

## 2022-09-02 LAB — CBC WITH DIFFERENTIAL/PLATELET
Absolute Monocytes: 583 cells/uL (ref 200–950)
Basophils Absolute: 50 cells/uL (ref 0–200)
Basophils Relative: 0.7 %
Eosinophils Absolute: 94 cells/uL (ref 15–500)
Eosinophils Relative: 1.3 %
HCT: 38.5 % (ref 35.0–45.0)
Hemoglobin: 12.8 g/dL (ref 11.7–15.5)
Lymphs Abs: 2016 cells/uL (ref 850–3900)
MCH: 30 pg (ref 27.0–33.0)
MCHC: 33.2 g/dL (ref 32.0–36.0)
MCV: 90.4 fL (ref 80.0–100.0)
MPV: 9.5 fL (ref 7.5–12.5)
Monocytes Relative: 8.1 %
Neutro Abs: 4457 cells/uL (ref 1500–7800)
Neutrophils Relative %: 61.9 %
Platelets: 301 10*3/uL (ref 140–400)
RBC: 4.26 10*6/uL (ref 3.80–5.10)
RDW: 12.9 % (ref 11.0–15.0)
Total Lymphocyte: 28 %
WBC: 7.2 10*3/uL (ref 3.8–10.8)

## 2022-09-02 LAB — COMPLETE METABOLIC PANEL WITH GFR
AG Ratio: 1.9 (calc) (ref 1.0–2.5)
ALT: 13 U/L (ref 6–29)
AST: 17 U/L (ref 10–35)
Albumin: 4.7 g/dL (ref 3.6–5.1)
Alkaline phosphatase (APISO): 67 U/L (ref 37–153)
BUN: 10 mg/dL (ref 7–25)
CO2: 29 mmol/L (ref 20–32)
Calcium: 10.2 mg/dL (ref 8.6–10.4)
Chloride: 98 mmol/L (ref 98–110)
Creat: 0.69 mg/dL (ref 0.50–1.05)
Globulin: 2.5 g/dL (calc) (ref 1.9–3.7)
Glucose, Bld: 92 mg/dL (ref 65–99)
Potassium: 4.6 mmol/L (ref 3.5–5.3)
Sodium: 135 mmol/L (ref 135–146)
Total Bilirubin: 0.6 mg/dL (ref 0.2–1.2)
Total Protein: 7.2 g/dL (ref 6.1–8.1)
eGFR: 94 mL/min/{1.73_m2} (ref >=60)

## 2022-09-02 LAB — HEMOGLOBIN A1C
Hgb A1c MFr Bld: 5.7 % of total Hgb — ABNORMAL HIGH (ref <5.7)
Mean Plasma Glucose: 117 mg/dL
eAG (mmol/L): 6.5 mmol/L

## 2022-09-02 LAB — INSULIN, RANDOM: Insulin: 5.6 u[IU]/mL

## 2022-09-02 LAB — LIPID PANEL
Cholesterol: 201 mg/dL — ABNORMAL HIGH (ref <200)
HDL: 67 mg/dL (ref >=50)
LDL Cholesterol (Calc): 112 mg/dL (calc) — ABNORMAL HIGH
Non-HDL Cholesterol (Calc): 134 mg/dL (calc) — ABNORMAL HIGH (ref <130)
Total CHOL/HDL Ratio: 3 (calc) (ref <5.0)
Triglycerides: 111 mg/dL (ref <150)

## 2022-09-02 LAB — MICROSCOPIC MESSAGE

## 2022-09-02 LAB — MICROALBUMIN / CREATININE URINE RATIO
Creatinine, Urine: 67 mg/dL (ref 20–275)
Microalb Creat Ratio: 12 mg/g creat (ref <30)
Microalb, Ur: 0.8 mg/dL

## 2022-09-02 LAB — VITAMIN D 25 HYDROXY (VIT D DEFICIENCY, FRACTURES): Vit D, 25-Hydroxy: 47 ng/mL (ref 30–100)

## 2022-09-02 LAB — TSH: TSH: 3.13 mIU/L (ref 0.40–4.50)

## 2022-09-02 LAB — MAGNESIUM: Magnesium: 2.1 mg/dL (ref 1.5–2.5)

## 2022-09-02 NOTE — Progress Notes (Signed)
^<^<^<^<^<^<^<^<^<^<^<^<^<^<^<^<^<^<^<^<^<^<^<^<^<^<^<^<^<^<^<^<^<^<^<^<^ ^>^>^>^>^>^>^>^>^>^>^>>^>^>^>^>^>^>^>^>^>^>^>^>^>^>^>^>^>^>^>^>^>^>^>^>^ -Test results slightly outside the reference range are not unusual. If there is anything important, I will review this with you,  otherwise it is considered normal test values.  If you have further questions,  please do not hesitate to contact me at the office or via My Chart.  ^<^<^<^<^<^<^<^<^<^<^<^<^<^<^<^<^<^<^<^<^<^<^<^<^<^<^<^<^<^<^<^<^<^<^<^<^ ^>^>^>^>^>^>^>^>^>^>^>^>^>^>^>^>^>^>^>^>^>^>^>^>^>^>^>^>^>^>^>^>^>^>^>^>^  - U/A - is OK - No Infection ^<^<^<^<^<^<^<^<^<^<^<^<^<^<^<^<^<^<^<^<^<^<^<^<^<^<^<^<^<^<^<^<^<^<^<^<^ ^>^>^>^>^>^>^>^>^>^>^>^>^>^>^>^>^>^>^>^>^>^>^>^>^>^>^>^>^>^>^>^>^>^>^>^>^   - Total Chol =201   is elevated  risk for Heart Attack /Stroke /Vascular Dementia     ( Ideal or Goal is less than 180 ! )  & - Bad /Dangerous LDL Chol =  112      ( Ideal or Goal is less than 70 ! )    - The cause is Bad Diet !  - But need to go ahead & start meds until get on a better diet to try &   - Recommend a stricter plant based low cholesterol diet   - Cholesterol only comes from animal sources                                                                 - ie. meat, dairy, egg yolks  - Eat all the vegetables you want.  - Avoid Meat, Avoid Meat , Avoid Meat  ! ! !                                                  -especially red meat - Beef AND Pork  - Avoid cheese & dairy - milk & ice cream.   - Cheese is the most concentrated form of trans-fats which                                                    is the worst thing to clog up our arteries.   - Cholesterol is too high - Recommend low cholesterol diet   - Cholesterol only comes from animal sources  - ie. meat, dairy, egg yolks  - Avoid Meat, Avoid Meat,  Avoid Meat - especially Red Meat - Beef AND Pork .  - Avoid cheese & dairy - milk & ice  cream.     - Cheese is the most concentrated form of trans-fats which  is the worst thing to clog up our arteries.   - Veggie cheese is OK which can be found in the fresh  produce section at [employer] or [employer] or [employer] ^<^<^<^<^<^<^<^<^<^<^<^<^<^<^<^<^<^<^<^<^<^<^<^<^<^<^<^<^<^<^<^<^<^<^<^<^ ^>^>^>^>^>^>^>^>^>^>^>^>^>^>^>^>^>^>^>^>^>^>^>^>^>^>^>^>^>^>^>^>^>^>^>^>^  - A1c = 5.7 %  -   is 12 week average Blood Sugar & is borderline elevated   So    - Avoid Sweets, Candy & White Stuff   - White Rice, White Potatoes, White Flour  - Breads &  Pasta  - it  is very important that you work harder with diet by  avoiding all foods that are white except chicken,  fish & calliflower.  - Avoid white rice  (brown & wild rice is OK),   - Avoid white potatoes  (sweet potatoes in moderation is OK),   White bread or wheat bread or anything made out of   white flour like bagels, donuts, rolls, buns, biscuits, cakes,  - pastries, cookies, pizza crust, and pasta (made from  white flour & egg whites)   - vegetarian pasta or spinach or wheat pasta is OK.  - Multigrain breads like Arnold's, Pepper[employer] or   multigrain sandwich thins or high fiber breads like   [place] bread or "Dave's Killer" breads that are  4 to 5 grams fiber per slice !  are best.    Diet, exercise and weight loss can reverse and cure  diabetes in the early stages.    - Diet, exercise and weight loss is very important in the   control and prevention of complications of diabetes  ^<^<^<^<^<^<^<^<^<^<^<^<^<^<^<^<^<^<^<^<^<^<^<^<^<^<^<^<^<^<^<^<^<^<^<^<^ ^>^>^>^>^>^>^>^>^>^>^>^>^>^>^>^>^>^>^>^>^>^>^>^>^>^>^>^>^>^>^>^>^>^>^>^>^  -Vit D = 47  - is Low 5,000 units  / day   - It is very important as a natural anti-inflammatory and helping the  immune system protect against viral infections, like  the Covid-19    helping hair, skin, and nails, as well as reducing stroke and  heart attack risk.   - It helps your bones and helps with mood.  - It also decreases numerous cancer risks so please  take it as directed.   - Low Vit D is associated with a 200-300% higher risk for  CANCER   and 200-300% higher risk for HEART   ATTACK  &  STROKE.    - It is also associated with higher death rate at younger ages,   autoimmune diseases like Rheumatoid arthritis, Lupus,  Multiple Sclerosis.     - Also many other serious conditions, like depression, Alzheimer's  Dementia, infertility, muscle aches, fatigue, fibromyalgia   - just to name a few. ^<^<^<^<^<^<^<^<^<^<^<^<^<^<^<^<^<^<^<^<^<^<^<^<^<^<^<^<^<^<^<^<^<^<^<^<^ ^>^>^>^>^>^>^>^>^>^>^>^>^>^>^>^>^>^>^>^>^>^>^>^>^>^>^>^>^>^>^>^>^>^>^>^>^  -

## 2022-10-15 ENCOUNTER — Encounter: Payer: Self-pay | Admitting: Internal Medicine

## 2022-11-27 ENCOUNTER — Encounter: Payer: Self-pay | Admitting: Internal Medicine

## 2022-12-03 ENCOUNTER — Other Ambulatory Visit: Payer: Self-pay

## 2022-12-03 DIAGNOSIS — Z1211 Encounter for screening for malignant neoplasm of colon: Secondary | ICD-10-CM | POA: Diagnosis not present

## 2022-12-03 DIAGNOSIS — Z1212 Encounter for screening for malignant neoplasm of rectum: Secondary | ICD-10-CM | POA: Diagnosis not present

## 2022-12-03 LAB — POC HEMOCCULT BLD/STL (HOME/3-CARD/SCREEN)
Card #2 Fecal Occult Blod, POC: NEGATIVE
Card #3 Fecal Occult Blood, POC: NEGATIVE
Fecal Occult Blood, POC: NEGATIVE

## 2023-02-03 NOTE — Progress Notes (Unsigned)
 MEDICARE ANNUAL VISIT  Assessment and Plan:  Annual Medicare Wellness Visit Due annually  Health maintenance reviewed  Mixed hyperlipidemia -continue medications, check lipids, decrease fatty foods, increase activity.   Elevated blood pressure reading without diagnosis of hypertension/ White coat Hypertension - continue DASH diet, exercise and monitor at home. Call if greater than 130/80.   ABNORMAL GLUCOSE Discussed disease progression and risks Discussed diet/exercise, weight management and risk modification  Vitamin D deficiency Supplement for goal 60-100  Sjogren's syndrome, with unspecified organ involvement (HCC) Continue follow up Dr. Kathi Ludwig  Other migraine without status migrainosus, not intractable Avoid triggers  VSD (ventricular septal defect), perimembranous/ Murmur Monitoring; stable; denies concerning sx  Rectocele, female/ Cystocele with prolapse Monitoring; follows GYN and [facility]  Osteoporosis 03/2022- due 2 years; Dr. Jennette Kettle and Dr. Kathi Ludwig are following Declines bisphosphonates Discussed Vit D, K2; weight bearing/pilates exercises 3 days a week   Anxiety Recently well controlled; continue stress management/lifestyle  Depression, major, recurrent, in full remission (HCC) Doing well at this time Lifestyle discussed: diet/exerise, sleep hygiene, stress management, hydration  Medication management Each visit, monitor CBC, CMP/GFR, magnesium   Hx of skin cancer BCC/SCC; derm following annually        Discussed med's effects and SE's. Screening labs and tests as requested with regular follow-up as recommended.  Future Appointments  Date Time Provider Department Center  09/10/2023 10:00 AM Lucky Cowboy, MD [facility] None  02/04/2024 11:00 AM Raynelle Dick, NP [facility] None    Plan:   During the course of the visit the patient was educated and counseled about appropriate screening and preventive services including:    Pneumococcal vaccine  Prevnar 13 Influenza vaccine Td vaccine Screening electrocardiogram Bone densitometry screening Colorectal cancer screening Diabetes screening Glaucoma screening Nutrition counseling  Advanced directives: requested    HPI  67 y.o. female presents for AWV and follow up. She has Sjogren's disease (HCC); VSD (ventricular septal defect), perimembranous; Osteoporosis; Vitamin D deficiency; Migraines; Rectocele, female; Cystocele with prolapse; Abnormal glucose (prediabetes); Hyperlipidemia; Elevated blood pressure reading without diagnosis of hypertension; Depression, major, recurrent, in complete remission (HCC); History of skin cancer; and Overweight (BMI 25.0-29.9) on their problem list.  She is married, 3 children, no grandchildren. She is retired from part time work, mostly Psychologist, occupational.   She follows Dr. Jennette Kettle GYN annually.  Known cystocele with prolapse, rectocele, mild, urinary sx improved with topical estrogen, also follows [facility]. Osteoporosis- she has been declining bisphosphonate's due to family hx of intolerance. Takes vit D/K2 and active with resistance exercises.   She has Sjogrens dx in 2012, mild, that is being monitored by Dr. Kathi Ludwig.   She has hx of depression/anxiety, recently in remission off of medications. Her and her husband are both retired which has caused a little more stress. But overall feels good.   BMI is Body mass index is 26.09 kg/m., she is working on diet and exercise, 4 miles of walking 3-4 times a week, stationary bike 3-4 times a week.  Does not eat red meat, has started to cut back on simple carbs Wt Readings from Last 3 Encounters:  02/04/23 171 lb 9.6 oz (77.8 kg)  09/01/22 174 lb 3.2 oz (79 kg)  05/20/22 175 lb 6.4 oz (79.6 kg)   She had abnormal carotid in 05/2017 suggesting 40-59% stenosis and LICA suggesting 1-39% stenosis. MRA 05/2017 showed mild left carotid stenosis She had recheck [place] the following year 12/2018  and 07/2020, resolved to history. CT coronary with CTA-  normal Known VSD, stable unchanged per ECHO in 2016.  Previously used propranolol prn for palpitations but has not had to use anymore.  Her blood pressure has been controlled at home, at home it is normal (consistently 110-124/70's), today their BP is BP: (!) 140/82. Continues to take consistently BP Readings from Last 3 Encounters:  02/04/23 (!) 140/82  09/01/22 (!) 170/90  05/20/22 (!) 152/78  She does workout. She denies chest pain, shortness of breath, dizziness.    She is not on cholesterol medication and denies myalgias. Her cholesterol is at goal. She has been limiting red meat, dairy and eggs. The cholesterol last visit was:  Lab Results  Component Value Date   CHOL 201 (H) 09/01/2022   HDL 67 09/01/2022   LDLCALC 112 (H) 09/01/2022   TRIG 111 09/01/2022   CHOLHDL 3.0 09/01/2022  . She has been working on diet and exercise for hx of prediabetes, she is not on bASA, she is not on ACE/ARB and denies foot ulcerations, hyperglycemia, hypoglycemia , increased appetite, nausea, paresthesia of the feet, polydipsia, polyuria, visual disturbances, vomiting and weight loss.  Last A1C in the office was:  Lab Results  Component Value Date   HGBA1C 5.7 (H) 09/01/2022    Last GFR:  Lab Results  Component Value Date   EGFR 94 09/01/2022    Patient is on Vitamin D supplement.  Taking 6000 IU daily  Lab Results  Component Value Date   VD25OH 47 09/01/2022         Current Medications:      Current Outpatient Medications (Analgesics):    acetaminophen (TYLENOL) 325 MG tablet, Take 2 tablets (650 mg total) by mouth every 6 (six) hours as needed for moderate pain.   ibuprofen (ADVIL) 800 MG tablet, Take 800 mg by mouth every 6 (six) hours as needed for moderate pain. PRN   Current Outpatient Medications (Other):    B Complex-C (SUPER B COMPLEX PO), Take 1 Dose by mouth 2 (two) times daily.    Calcium Carbonate-Vitamin D  (CALTRATE 600+D PO), Take 1 tablet by mouth daily. Takes 500 mg QD   Cholecalciferol (VITAMIN D) 2000 UNITS tablet, Take 5,000 Units by mouth daily.   estradiol (ESTRACE) 0.1 MG/GM vaginal cream, Place 1 g vaginally 2 (two) times a week.   Magnesium 400 MG TABS, Take 250 mg by mouth daily.   Omega 3-6-9 Fatty Acids (OMEGA 3-6-9 COMPLEX PO), Take 1 tablet by mouth daily with lunch.   Health Maintenance:   Immunization History  Administered Date(s) Administered   Influenza, High Dose Seasonal PF 02/10/2019, 02/20/2021   Influenza, Seasonal, Injecte, Preservative Fre 05/26/2016   Influenza-Unspecified 01/25/2013, 02/09/2014, 02/08/2020, 03/05/2022   PFIZER Comirnaty(Gray Top)Covid-19 Tri-Sucrose Vaccine 09/05/2020, 03/02/2021   PFIZER(Purple Top)SARS-COV-2 Vaccination 07/04/2019, 07/25/2019, 02/08/2020   PNEUMOCOCCAL CONJUGATE-20 07/17/2021   PPD Test 04/19/2013, 05/17/2014   Pneumococcal Conjugate-13 10/20/2019   Pneumococcal Polysaccharide-23 04/19/2013   Td 07/13/2020   Tdap 08/20/2010   Unspecified SARS-COV-2 Vaccination 02/20/2021, 03/05/2022   Zoster Recombinant(Shingrix) 07/11/2021, 10/08/2021   Health Maintenance  Topic Date Due   COVID-19 Vaccine (7 - 2023-24 season) 03/04/2023 (Originally 01/11/2023)   INFLUENZA VACCINE  03/05/2023 (Originally 12/11/2022)   MAMMOGRAM  04/03/2023   Medicare Annual Wellness (AWV)  02/04/2024   DEXA SCAN  04/02/2024   Colonoscopy  07/11/2029   DTaP/Tdap/Td (3 - Td or Tdap) 07/14/2030   Pneumonia Vaccine 1+ Years old  Completed   Hepatitis C Screening  Completed   Zoster Vaccines-  Shingrix  Completed   HPV VACCINES  Aged Out     Last Dental Exam:  Dr. Mylinda Latina, Twice yearly visits, last 01/28/2023 Last Eye Exam: Dr. [place] 12/2022 Last Derm: Dr. Amy [place], annually, last 08/2020, hx of [facility], SCC  Patient Care Team: Lucky Cowboy, MD as PCP - General (Internal Medicine) Croitoru, Rachelle Hora, MD as PCP - Cardiology (Cardiology) [place], Heather, OD  (Optometry) Freddy Finner, MD (Inactive) as Consulting Physician (Obstetrics and Gynecology) Charlott Rakes, MD as Consulting Physician (Gastroenterology) Croitoru, Rachelle Hora, MD as Consulting Physician (Cardiology) [place], Amy, MD as Consulting Physician (Dermatology) Rossie Muskrat, MD as Consulting Physician (Rheumatology) Drema Halon, MD (Inactive) as Consulting Physician (Otolaryngology)  Medical History:  Past Medical History:  Diagnosis Date   Anemia    Anxiety    situational   Arthritis    Closed trimalleolar fracture of ankle, left, initial encounter 11/15/2018   Complication of anesthesia    Depression    Family history of adverse reaction to anesthesia    Mother - nausea, daughter nausea   GERD (gastroesophageal reflux disease)    Heart murmur    Migraines    Osteoporosis    PONV (postoperative nausea and vomiting) 1976, 2001   Nausea    Sjogren's disease (HCC)    Unspecified vitamin D deficiency    VSD (ventricular septal defect), perimembranous    Allergies Allergies  Allergen Reactions   Estradiol     Patch caused itching     SURGICAL HISTORY She  has a past surgical history that includes Esophagogastroduodenoscopy (01/28/13); Dilation and curettage of uterus; Ablation on endometriosis; cervical conization (1976); lip biopsy (06/2011); Colonoscopy (2012); and ORIF ankle fracture (Left, 11/17/2018).   FAMILY HISTORY Her family history includes ALS in an other family member; Bladder Cancer in her father; Breast cancer in her cousin; Hypertension in her father and mother; Multiple sclerosis in her cousin; Ovarian cancer in her maternal aunt; Parkinson's disease in her cousin; Stroke in her father and mother; Uterine cancer in her maternal grandmother; Valvular heart disease in her mother.   SOCIAL HISTORY She  reports that she has never smoked. She has never used smokeless tobacco. She reports that she does not currently use alcohol. She reports that she  does not use drugs.  MEDICARE WELLNESS OBJECTIVES: Physical activity:  walks 3-4 miles 4 days a week, rides stationary bike 3-4 days a week Cardiac risk factors: Cardiac Risk Factors include: advanced age (>37men, >18 women);dyslipidemia Depression/mood screen:      02/04/2023   11:34 AM  Depression screen PHQ 2/9  Decreased Interest 0  Down, Depressed, Hopeless 1  PHQ - 2 Score 1    ADLs:     02/04/2023   11:36 AM  In your present state of health, do you have any difficulty performing the following activities:  Hearing? 0  Vision? 0  Difficulty concentrating or making decisions? 0  Walking or climbing stairs? 0  Dressing or bathing? 0  Doing errands, shopping? 0     Cognitive Testing  Alert? Yes  Normal Appearance?Yes  Oriented to person? Yes  Place? Yes   Time? Yes  Recall of three objects?  Yes  Can perform simple calculations? Yes  Displays appropriate judgment?Yes  Can read the correct time from a watch face?Yes  EOL planning: Does Patient Have a Medical Advance Directive?: Yes Type of Advance Directive: Healthcare Power of Attorney, Living will Does patient want to make changes to medical advance directive?: No - Patient declined  Copy of Healthcare Power of Attorney in Chart?: No - copy requested     Review of Systems: Review of Systems  Constitutional:  Negative for chills, fever and malaise/fatigue.  HENT:  Positive for tinnitus (recurrent R ear deep thumping only when lying down; saw ENT for this in 2020). Negative for congestion, ear discharge, ear pain, hearing loss, nosebleeds, sinus pain and sore throat.   Eyes: Negative.        Dry eye  Respiratory:  Negative for cough, shortness of breath, wheezing and stridor.   Cardiovascular:  Negative for chest pain, palpitations and leg swelling.  Gastrointestinal:  Negative for abdominal pain, blood in stool, constipation, diarrhea, heartburn and melena.  Genitourinary: Negative.   Musculoskeletal: Negative.    Skin: Negative.   Neurological:  Negative for dizziness, tingling, tremors, sensory change, speech change, focal weakness, seizures, loss of consciousness and headaches.  Psychiatric/Behavioral:  Negative for depression, substance abuse and suicidal ideas. The patient is not nervous/anxious and does not have insomnia.     Physical Exam: Estimated body mass index is 26.09 kg/m as calculated from the following:   Height as of this encounter: 5\' 8"  (1.727 m).   Weight as of this encounter: 171 lb 9.6 oz (77.8 kg). BP (!) 140/82   Pulse 76   Temp (!) 97.5 F (36.4 C)   Ht 5\' 8"  (1.727 m)   Wt 171 lb 9.6 oz (77.8 kg)   SpO2 98%   BMI 26.09 kg/m   General Appearance: Well nourished well developed, in no apparent distress.  Eyes: PERRLA, EOMs, conjunctiva no swelling or erythema ENT/Mouth: Ear canals normal without obstruction, swelling, erythema, or discharge.  TMs normal  with no erythema, bulging, retraction. Oropharynx moist and clear with no exudate, erythema, or swelling.  Neck: Supple, thyroid normal. No bruits.  No cervical adenopathy Respiratory: Respiratory effort normal, Breath sounds clear A&P without wheeze, rhonchi, rales.   Cardio: RRR with holosystolic low pitched murmur heard best on the L 3rd ICS, 2/6, no rubs or gallops. Brisk peripheral pulses without edema.  Chest: symmetric, with normal excursions Abdomen: Soft, nontender, no guarding, rebound, hernias, masses, or organomegaly.  Lymphatics: Non tender without lymphadenopathy. Musculoskeletal: Full ROM all peripheral extremities,5/5 strength, and normal gait.  Skin: Warm, dry  Neuro: Awake and oriented X 3, Cranial nerves intact, reflexes equal bilaterally. Normal muscle tone, no cerebellar symptoms. Sensation intact.  Psych:  normal affect, Insight and Judgment appropriate.     Adhira Jamil E  11:45 AM [facility]

## 2023-02-04 ENCOUNTER — Ambulatory Visit (INDEPENDENT_AMBULATORY_CARE_PROVIDER_SITE_OTHER): Payer: Medicare Other | Admitting: Nurse Practitioner

## 2023-02-04 ENCOUNTER — Encounter: Payer: Self-pay | Admitting: Nurse Practitioner

## 2023-02-04 VITALS — BP 140/82 | HR 76 | Temp 97.5°F | Ht 68.0 in | Wt 171.6 lb

## 2023-02-04 DIAGNOSIS — M818 Other osteoporosis without current pathological fracture: Secondary | ICD-10-CM

## 2023-02-04 DIAGNOSIS — Z0000 Encounter for general adult medical examination without abnormal findings: Secondary | ICD-10-CM

## 2023-02-04 DIAGNOSIS — R6889 Other general symptoms and signs: Secondary | ICD-10-CM

## 2023-02-04 DIAGNOSIS — F3342 Major depressive disorder, recurrent, in full remission: Secondary | ICD-10-CM

## 2023-02-04 DIAGNOSIS — N814 Uterovaginal prolapse, unspecified: Secondary | ICD-10-CM

## 2023-02-04 DIAGNOSIS — Q210 Ventricular septal defect: Secondary | ICD-10-CM

## 2023-02-04 DIAGNOSIS — R7309 Other abnormal glucose: Secondary | ICD-10-CM

## 2023-02-04 DIAGNOSIS — G43809 Other migraine, not intractable, without status migrainosus: Secondary | ICD-10-CM

## 2023-02-04 DIAGNOSIS — M3500 Sicca syndrome, unspecified: Secondary | ICD-10-CM

## 2023-02-04 DIAGNOSIS — E559 Vitamin D deficiency, unspecified: Secondary | ICD-10-CM

## 2023-02-04 DIAGNOSIS — Z79899 Other long term (current) drug therapy: Secondary | ICD-10-CM

## 2023-02-04 DIAGNOSIS — R0989 Other specified symptoms and signs involving the circulatory and respiratory systems: Secondary | ICD-10-CM

## 2023-02-04 DIAGNOSIS — Z789 Other specified health status: Secondary | ICD-10-CM

## 2023-02-04 DIAGNOSIS — Z0001 Encounter for general adult medical examination with abnormal findings: Secondary | ICD-10-CM | POA: Diagnosis not present

## 2023-02-04 DIAGNOSIS — E782 Mixed hyperlipidemia: Secondary | ICD-10-CM | POA: Diagnosis not present

## 2023-02-04 DIAGNOSIS — Z85828 Personal history of other malignant neoplasm of skin: Secondary | ICD-10-CM

## 2023-02-04 DIAGNOSIS — N816 Rectocele: Secondary | ICD-10-CM

## 2023-02-04 DIAGNOSIS — F419 Anxiety disorder, unspecified: Secondary | ICD-10-CM

## 2023-02-04 NOTE — Patient Instructions (Signed)

## 2023-02-05 LAB — COMPLETE METABOLIC PANEL WITH GFR
AG Ratio: 1.9 (calc) (ref 1.0–2.5)
ALT: 12 U/L (ref 6–29)
AST: 16 U/L (ref 10–35)
Albumin: 4.5 g/dL (ref 3.6–5.1)
Alkaline phosphatase (APISO): 67 U/L (ref 37–153)
BUN: 10 mg/dL (ref 7–25)
CO2: 28 mmol/L (ref 20–32)
Calcium: 10.1 mg/dL (ref 8.6–10.4)
Chloride: 96 mmol/L — ABNORMAL LOW (ref 98–110)
Creat: 0.62 mg/dL (ref 0.50–1.05)
Globulin: 2.4 g/dL (calc) (ref 1.9–3.7)
Glucose, Bld: 89 mg/dL (ref 65–99)
Potassium: 4 mmol/L (ref 3.5–5.3)
Sodium: 134 mmol/L — ABNORMAL LOW (ref 135–146)
Total Bilirubin: 0.6 mg/dL (ref 0.2–1.2)
Total Protein: 6.9 g/dL (ref 6.1–8.1)
eGFR: 97 mL/min/{1.73_m2} (ref >=60)

## 2023-02-05 LAB — CBC WITH DIFFERENTIAL/PLATELET
Absolute Monocytes: 495 cells/uL (ref 200–950)
Basophils Absolute: 59 cells/uL (ref 0–200)
Basophils Relative: 0.9 %
Eosinophils Absolute: 73 cells/uL (ref 15–500)
Eosinophils Relative: 1.1 %
HCT: 37.7 % (ref 35.0–45.0)
Hemoglobin: 12.4 g/dL (ref 11.7–15.5)
Lymphs Abs: 2145 cells/uL (ref 850–3900)
MCH: 30 pg (ref 27.0–33.0)
MCHC: 32.9 g/dL (ref 32.0–36.0)
MCV: 91.3 fL (ref 80.0–100.0)
MPV: 9.6 fL (ref 7.5–12.5)
Monocytes Relative: 7.5 %
Neutro Abs: 3828 cells/uL (ref 1500–7800)
Neutrophils Relative %: 58 %
Platelets: 288 10*3/uL (ref 140–400)
RBC: 4.13 10*6/uL (ref 3.80–5.10)
RDW: 12.4 % (ref 11.0–15.0)
Total Lymphocyte: 32.5 %
WBC: 6.6 10*3/uL (ref 3.8–10.8)

## 2023-02-05 LAB — LIPID PANEL
Cholesterol: 177 mg/dL (ref <200)
HDL: 65 mg/dL (ref >=50)
LDL Cholesterol (Calc): 94 mg/dL (calc)
Non-HDL Cholesterol (Calc): 112 mg/dL (calc) (ref <130)
Total CHOL/HDL Ratio: 2.7 (calc) (ref <5.0)
Triglycerides: 90 mg/dL (ref <150)

## 2023-02-05 LAB — MAGNESIUM: Magnesium: 2.1 mg/dL (ref 1.5–2.5)

## 2023-04-07 LAB — HM MAMMOGRAPHY

## 2023-04-07 LAB — HM PAP SMEAR: HPV, high-risk: NEGATIVE

## 2023-09-10 ENCOUNTER — Ambulatory Visit: Payer: Medicare Other | Admitting: Family Medicine

## 2023-09-10 ENCOUNTER — Encounter: Payer: Self-pay | Admitting: Family Medicine

## 2023-09-10 ENCOUNTER — Encounter: Payer: Medicare Other | Admitting: Internal Medicine

## 2023-09-10 VITALS — BP 164/85 | HR 110 | Ht 68.0 in | Wt 168.0 lb

## 2023-09-10 DIAGNOSIS — Z7689 Persons encountering health services in other specified circumstances: Secondary | ICD-10-CM

## 2023-09-10 DIAGNOSIS — M3500 Sicca syndrome, unspecified: Secondary | ICD-10-CM | POA: Diagnosis not present

## 2023-09-10 DIAGNOSIS — R1031 Right lower quadrant pain: Secondary | ICD-10-CM

## 2023-09-10 DIAGNOSIS — F3342 Major depressive disorder, recurrent, in full remission: Secondary | ICD-10-CM | POA: Diagnosis not present

## 2023-09-10 DIAGNOSIS — G8929 Other chronic pain: Secondary | ICD-10-CM

## 2023-09-10 DIAGNOSIS — G43809 Other migraine, not intractable, without status migrainosus: Secondary | ICD-10-CM | POA: Diagnosis not present

## 2023-09-10 DIAGNOSIS — N814 Uterovaginal prolapse, unspecified: Secondary | ICD-10-CM

## 2023-09-10 NOTE — Assessment & Plan Note (Signed)
 Not on meication for this.  Eye drops cause irritation as well after a few days of use.

## 2023-09-10 NOTE — Assessment & Plan Note (Signed)
 Normal CT abd.  MSK exam normal today.  Seems most likely msk pain but uncertain exactly what is causing it.  Discussed otc pain relief including topical, heat.  Offered PT referral as well.

## 2023-09-10 NOTE — Progress Notes (Signed)
 New Patient Office Visit  Subjective   Patient ID: Bianca Fernandez, female    DOB: 1956/03/24  Age: 68 y.o. MRN: [id number]  CC:  Chief Complaint  Patient presents with   New Patient (Initial Visit)    HPI Bianca Fernandez presents to establish care  Subjective: - Right flank pain since 05/08/2022. Seen at urgent care, given Tylenol . Seen by NP in 05/2022, thought to be muscle strain. Seen again in 01/2023 during wellness visit, NP not concerned. Pain recurred severely on a Saturday in 06/2023, went to urgent care, CT scan two weeks later was normal. - Pain described as intermittent, can last half a day, sometimes skips days. Not dull, not sharp-sharp, sometimes burning. Better with walking. Located in right flank, sometimes radiates to back. 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 Married with 3 children, no grandchildren. - Allergies: reports eye drops cause burning and sliminess after a few days of use   Outpatient Encounter Medications as of 09/10/2023  Medication Sig   acetaminophen  (TYLENOL ) 325 MG tablet Take 2 tablets (650 mg total) by mouth every 6 (six) hours as needed for moderate pain. (Patient taking differently: Take 500 mg by mouth every 6 (six) hours as needed for moderate pain (pain score 4-6).)   B Complex-C (SUPER B COMPLEX PO) Take 1 Dose by mouth 2 (two) times daily.    estradiol (ESTRACE) 0.1 MG/GM vaginal cream Place 1 g vaginally 2 (two) times a week.   ibuprofen (ADVIL) 800 MG tablet Take 800 mg by mouth every 6 (six) hours as needed for moderate pain. PRN    Omega 3-6-9 Fatty Acids (OMEGA 3-6-9 COMPLEX PO) Take 1 tablet by mouth daily with lunch.    Calcium Carbonate-Vitamin D  (CALTRATE 600+D PO) Take 1 tablet by mouth daily. Takes 500 mg QD   Cholecalciferol (VITAMIN D ) 2000 UNITS tablet Take 5,000 Units by mouth daily.   Magnesium 400 MG TABS Take 250 mg by mouth daily.   No facility-administered encounter medications on file as of 09/10/2023.    Past Medical History:  Diagnosis Date   Anemia    Anxiety    situational   Arthritis    Closed trimalleolar fracture of ankle, left, initial encounter 11/15/2018   Complication of anesthesia    Depression    Family history of adverse reaction to anesthesia    Mother - nausea, daughter nausea   GERD (gastroesophageal reflux disease)    Heart murmur    Migraines    Osteoporosis    PONV (postoperative nausea and vomiting) 1976, 2001   Nausea    Sjogren's disease (HCC)    Unspecified vitamin D  deficiency    VSD (ventricular septal defect), perimembranous     Past Surgical History:  Procedure Laterality Date   ABLATION ON ENDOMETRIOSIS     cervical conization  1976   COLONOSCOPY  2012   DILATION AND CURETTAGE OF UTERUS     x 2   ESOPHAGOGASTRODUODENOSCOPY  01/28/13   Dr Honey Lusty- dysmotility   lip biopsy  06/2011   + sjogrens, Dr. Jenean Minus at [facility]   ORIF ANKLE FRACTURE Left 11/17/2018   Procedure: OPEN REDUCTION INTERNAL FIXATION (ORIF)  LEFT ANKLE FRACTURE;  Surgeon: Timothy Ford, MD;  Location: [facility] OR;  Service: Orthopedics;  Laterality: Left;    Family History  Problem Relation Age of Onset   Hypertension Mother    Stroke Mother    Valvular heart disease Mother    Hypertension Father    Stroke Father    Bladder Cancer Father        worked in Physiological scientist   Uterine cancer Maternal Grandmother    Ovarian cancer Maternal Aunt    Breast cancer Cousin    Multiple sclerosis Cousin    Parkinson's disease Cousin    ALS Other     Social History   Socioeconomic History   Marital  status: Married    Spouse name: Not on file   Number of children: Not on file   Years of education: Not on file   Highest education level: 12th grade  Occupational History   Not on file  Tobacco Use   Smoking status: Never   Smokeless tobacco: Never  Vaping Use   Vaping status: Never Used  Substance and Sexual Activity   Alcohol use: Not Currently   Drug use: No   Sexual activity: Not on file  Other Topics Concern   Not on file  Social History Narrative   Not on file   Social Drivers of Health   Financial Resource Strain: Low Risk  (09/06/2023)   Overall Financial Resource Strain (CARDIA)    Difficulty of Paying Living Expenses: Not very hard  Food Insecurity: No Food Insecurity (09/06/2023)   Hunger Vital Sign    Worried About Running Out of Food in the Last Year: Never true    Ran Out of Food in the Last Year: Never true  Transportation Needs: No Transportation Needs (09/06/2023)   PRAPARE - Administrator, Civil Service (Medical): No    Lack of Transportation (Non-Medical): No  Physical Activity: Insufficiently Active (09/06/2023)   Exercise Vital Sign    Days of Exercise per Week: 4 days    Minutes of Exercise per Session: 30 min  Stress: Stress Concern Present (09/06/2023)   [employer] of Occupational Health - Occupational Stress Questionnaire    Feeling of Stress : To some extent  Social Connections: Moderately Isolated (09/06/2023)   Social Connection and Isolation Panel [NHANES]    Frequency of Communication with Friends and Family: More than three times a week    Frequency of Social Gatherings with Friends and Family: Once a week    Attends Religious Services: Never    Database administrator or Organizations: No    Attends Engineer, structural: Not on file    Marital Status: Married  Catering manager Violence: Not on file    ROS     Objective   BP (!) 164/85   Pulse (!) 110   Ht 5\' 8"  (1.727 m)   Wt 168 lb 0.6 oz (76.2 kg)    SpO2 100%   BMI 25.55 kg/m   Physical Exam General: Alert, oriented HEENT: PERRL, EOMI, moist mucosa CV: Regular rate and rhythm, 2/6 systolic right-sided murmur. Pulmonary: Scar bilaterally GI: Soft, nontender.  Normal bowel sounds MSK: Normal FADIR and FABER testing.  No tenderness to palpation over the lumbar sacral area. Psych: Pleasant affect     Assessment & Plan:   Encounter to establish care  Sjogren's syndrome, with unspecified organ involvement [facility]) Assessment & Plan: Not on meication for this.  Eye drops cause irritation as  well after a few days of use.    Other migraine without status migrainosus, not intractable Assessment & Plan: Has not had migraines in many, many years   Cystocele with prolapse Assessment & Plan: Uses topical estrogen.  Continue treatment   Depression, major, recurrent, in complete remission [facility]) Assessment & Plan: Previously took ssri in 2019.  Not on medication currently   Chronic RLQ pain Assessment & Plan: Normal CT abd.  MSK exam normal today.  Seems most likely msk pain but uncertain exactly what is causing it.  Discussed otc pain relief including topical, heat.  Offered PT referral as well.      Return in about 3 months (around 12/11/2023) for physical.   Laneta Pintos, MD

## 2023-09-10 NOTE — Patient Instructions (Signed)
 It was nice to see you today,  We addressed the following topics today: -I am not certain what is causing your pain, but some things you can do for pain relief include the following: - Continue taking the Tylenol  as needed - Use over-the-counter topical such as Voltaren gel or menthol containing topical gels - Use a heating pad as needed - Physical therapy may help as well if it is a musculoskeletal issue.  If you would like physical therapy let us  know and I can put the order in  -We will follow-up in about 3 months for your physical.  Have a great day,  Etha Henle, MD

## 2023-09-10 NOTE — Assessment & Plan Note (Signed)
 Has not had migraines in many, many years

## 2023-09-10 NOTE — Assessment & Plan Note (Signed)
 Uses topical estrogen.  Continue treatment

## 2023-09-10 NOTE — Assessment & Plan Note (Signed)
 Previously took ssri in 2019.  Not on medication currently

## 2023-12-04 ENCOUNTER — Other Ambulatory Visit: Payer: Self-pay | Admitting: *Deleted

## 2023-12-04 DIAGNOSIS — R7309 Other abnormal glucose: Secondary | ICD-10-CM

## 2023-12-04 DIAGNOSIS — R030 Elevated blood-pressure reading, without diagnosis of hypertension: Secondary | ICD-10-CM

## 2023-12-04 DIAGNOSIS — E782 Mixed hyperlipidemia: Secondary | ICD-10-CM

## 2023-12-07 ENCOUNTER — Other Ambulatory Visit: Payer: Self-pay

## 2023-12-07 ENCOUNTER — Other Ambulatory Visit

## 2023-12-07 DIAGNOSIS — Z130 Encounter for screening for diseases of the blood and blood-forming organs and certain disorders involving the immune mechanism: Secondary | ICD-10-CM

## 2023-12-07 DIAGNOSIS — R7309 Other abnormal glucose: Secondary | ICD-10-CM

## 2023-12-07 DIAGNOSIS — R030 Elevated blood-pressure reading, without diagnosis of hypertension: Secondary | ICD-10-CM

## 2023-12-07 DIAGNOSIS — M3500 Sicca syndrome, unspecified: Secondary | ICD-10-CM

## 2023-12-07 DIAGNOSIS — E782 Mixed hyperlipidemia: Secondary | ICD-10-CM

## 2023-12-08 LAB — CBC WITH DIFFERENTIAL/PLATELET
Basophils Absolute: 0.1 x10E3/uL (ref 0.0–0.2)
Basos: 1 %
EOS (ABSOLUTE): 0.1 x10E3/uL (ref 0.0–0.4)
Eos: 2 %
Hematocrit: 38.8 % (ref 34.0–46.6)
Hemoglobin: 12.8 g/dL (ref 11.1–15.9)
Immature Grans (Abs): 0 x10E3/uL (ref 0.0–0.1)
Immature Granulocytes: 0 %
Lymphocytes Absolute: 1.8 x10E3/uL (ref 0.7–3.1)
Lymphs: 32 %
MCH: 30.5 pg (ref 26.6–33.0)
MCHC: 33 g/dL (ref 31.5–35.7)
MCV: 92 fL (ref 79–97)
Monocytes Absolute: 0.4 x10E3/uL (ref 0.1–0.9)
Monocytes: 8 %
Neutrophils Absolute: 3.2 x10E3/uL (ref 1.4–7.0)
Neutrophils: 57 %
Platelets: 294 x10E3/uL (ref 150–450)
RBC: 4.2 x10E6/uL (ref 3.77–5.28)
RDW: 12.4 % (ref 11.7–15.4)
WBC: 5.5 x10E3/uL (ref 3.4–10.8)

## 2023-12-08 LAB — COMPREHENSIVE METABOLIC PANEL WITH GFR
ALT: 13 IU/L (ref 0–32)
AST: 21 IU/L (ref 0–40)
Albumin: 4.6 g/dL (ref 3.9–4.9)
Alkaline Phosphatase: 76 IU/L (ref 44–121)
BUN/Creatinine Ratio: 13 (ref 12–28)
BUN: 9 mg/dL (ref 8–27)
Bilirubin Total: 0.6 mg/dL (ref 0.0–1.2)
CO2: 21 mmol/L (ref 20–29)
Calcium: 9.7 mg/dL (ref 8.7–10.3)
Chloride: 97 mmol/L (ref 96–106)
Creatinine, Ser: 0.71 mg/dL (ref 0.57–1.00)
Globulin, Total: 2.1 g/dL (ref 1.5–4.5)
Glucose: 94 mg/dL (ref 70–99)
Potassium: 4.5 mmol/L (ref 3.5–5.2)
Sodium: 134 mmol/L (ref 134–144)
Total Protein: 6.7 g/dL (ref 6.0–8.5)
eGFR: 92 mL/min/1.73 (ref >59)

## 2023-12-08 LAB — LIPID PANEL
Chol/HDL Ratio: 3 ratio (ref 0.0–4.4)
Cholesterol, Total: 186 mg/dL (ref 100–199)
HDL: 63 mg/dL (ref >39)
LDL Chol Calc (NIH): 108 mg/dL — ABNORMAL HIGH (ref 0–99)
Triglycerides: 82 mg/dL (ref 0–149)
VLDL Cholesterol Cal: 15 mg/dL (ref 5–40)

## 2023-12-08 LAB — FREE K+L LT CHAINS,QN,S
Ig Kappa Free Light Chain: 15.7 mg/L (ref 3.3–19.4)
Ig Lambda Free Light Chain: 10 mg/L (ref 5.7–26.3)
KAPPA/LAMBDA RATIO: 1.57 (ref 0.26–1.65)

## 2023-12-08 LAB — C-REACTIVE PROTEIN: CRP: 3 mg/L (ref 0–10)

## 2023-12-08 LAB — PROTEIN ELECTROPHORESIS, SERUM, WITH REFLEX
A/G Ratio: 1.6 (ref 0.7–1.7)
Albumin ELP: 4.1 g/dL (ref 2.9–4.4)
Alpha 1: 0.2 g/dL (ref 0.0–0.4)
Alpha 2: 0.7 g/dL (ref 0.4–1.0)
Beta: 0.9 g/dL (ref 0.7–1.3)
Gamma Globulin: 0.9 g/dL (ref 0.4–1.8)
Globulin, Total: 2.6 g/dL (ref 2.2–3.9)

## 2023-12-08 LAB — HEMOGLOBIN A1C
Est. average glucose Bld gHb Est-mCnc: 108 mg/dL
Hgb A1c MFr Bld: 5.4 % (ref 4.8–5.6)

## 2023-12-08 LAB — SEDIMENTATION RATE: Sed Rate: 7 mm/h (ref 0–40)

## 2023-12-09 ENCOUNTER — Ambulatory Visit: Payer: Self-pay

## 2023-12-15 ENCOUNTER — Encounter: Payer: Self-pay | Admitting: Family Medicine

## 2023-12-15 ENCOUNTER — Ambulatory Visit (INDEPENDENT_AMBULATORY_CARE_PROVIDER_SITE_OTHER): Admitting: Family Medicine

## 2023-12-15 VITALS — BP 112/72 | HR 86 | Ht 68.0 in | Wt 166.1 lb

## 2023-12-15 DIAGNOSIS — E782 Mixed hyperlipidemia: Secondary | ICD-10-CM | POA: Diagnosis not present

## 2023-12-15 DIAGNOSIS — R030 Elevated blood-pressure reading, without diagnosis of hypertension: Secondary | ICD-10-CM | POA: Diagnosis not present

## 2023-12-15 DIAGNOSIS — Z0000 Encounter for general adult medical examination without abnormal findings: Secondary | ICD-10-CM | POA: Diagnosis not present

## 2023-12-15 NOTE — Patient Instructions (Addendum)
 It was nice to see you today,  We addressed the following topics today: -If you would like to check the accuracy of your blood pressure machine at home, bring it to our office and schedule a nurse visit.  We can then check it against the readings from our machine. - I will request [facility] from [facility] for your mammogram and other records. - Your cholesterol level is slightly high.  Try to limit your saturated fat intake from red meats, dairy products such as butter, cheese, etc.   Have a great day,  Rolan Slain, MD

## 2023-12-15 NOTE — Progress Notes (Unsigned)
 Established Patient Office Visit  Subjective   Patient ID: Bianca Fernandez, female    DOB: February 10, 1956  Age: 68 y.o. MRN: [id number]  Chief Complaint  Patient presents with   Annual Exam    HPI Subjective - here today for physical.  - No new issues or concerns. - Reports having had blood tests on 09/07/2023 and requests a copy of the results be sent to her rheumatologist, Dr. Mikel. - Had a visit with the rheumatologist two weeks ago, with no changes reported. - History of hypothyroidism diagnosed in 2011, which was later attributed to Sjogren's syndrome. States thyroid  function has been normal since 2012. Inquires about the frequency of thyroid  testing. - Reports elevated LDL on recent labs. Attributes this to eating out frequently since her husband retired. States she has been trying to make healthier choices like grilled chicken and salad over the past year. Believes she can lower her cholesterol by eating at home more often. - Reports decreased walking for exercise due to hot weather. - Reports having a mammogram last year at [facility] with Dr. Marylee. Also receives breast exams, pelvic exams, pap smears, and bone density scans there. - Reports blood pressure is high in the office but normal at home. Home BP readings are lower as the day progresses. Has an older Omron brand home BP cuff. Plans to have it checked at her cardiology appointment in September.  Medications Takes estrogen cream. Over-the-counter medications include Tylenol , B vitamin, omega-3, 6, 9 complex, and ibuprofen as needed.  PMH, PSH, FH, Social Hx PMH: Hypothyroidism (diagnosed 2011), Sjogren's syndrome.  ROS - General: Denies new concerns. - Endocrine: History of hypothyroidism, currently euthyroid. - Cardiovascular: Reports elevated cholesterol. Denies symptoms of very high blood pressure.   The 10-year ASCVD risk score (Arnett DK, et al., 2019) is: 6.3%  Health Maintenance Due  Topic Date Due    COVID-19 Vaccine (7 - 2024-25 season) 01/11/2023   MAMMOGRAM  04/03/2023   INFLUENZA VACCINE  12/11/2023   Medicare Annual Wellness (AWV)  02/04/2024      Objective:     BP 112/72 Comment: home readings  Pulse 86 Comment: home readings  Ht 5' 8 (1.727 m)   Wt 166 lb 1.9 oz (75.4 kg)   SpO2 100%   BMI 25.26 kg/m  {Vitals History (Optional):23777}  Physical Exam Gen: alert, oriented Heent: perrla, eomi Cv: rrr, no murmur Pulm; lctab Gi; soft, nbs.  Msk: normal gait.  Ext: no pedal edema.  Skin: warm and dry Psych: pleasant affect   No results found for any visits on 12/15/23.      Assessment & Plan:   Physical exam, annual  Mixed hyperlipidemia Assessment & Plan: Recent labs showed an LDL of 108. Reports frequent dining out. No statin therapy is indicated at this time as the goal for initiating medication would be an LDL under 100, and she is only slightly above this. - Counselled on dietary modifications, including limiting saturated fat intake from red meats and dairy products like butter and cheese. - Encouraged to make healthier choices when dining out. - Advised on the importance of exercise.  Orders: -     Lipid panel; Future  Elevated blood pressure reading without diagnosis of hypertension Assessment & Plan: BP is elevated in the office but reportedly normal at home. Uses an older Omron BP machine. - advised pt she can bring in her home machine and compare it to our machine to see if it is accurate.  Healthcare maintenance Assessment & Plan: - Will request records from [facility] (Dr. Marylee) for recent mammogram and other preventative care records. - Will continue to check thyroid  function annually with labs. - Follow up in 6 months for routine check-up.      Return in about 6 months (around 06/16/2024) for hld, HTN.    Toribio MARLA Slain, MD

## 2023-12-16 DIAGNOSIS — Z0000 Encounter for general adult medical examination without abnormal findings: Secondary | ICD-10-CM | POA: Insufficient documentation

## 2023-12-16 NOTE — Assessment & Plan Note (Signed)
 BP is elevated in the office but reportedly normal at home. Uses an older Omron BP machine. - advised pt she can bring in her home machine and compare it to our machine to see if it is accurate.

## 2023-12-16 NOTE — Assessment & Plan Note (Signed)
-   Will request records from [facility] (Dr. Marylee) for recent mammogram and other preventative care records. - Will continue to check thyroid  function annually with labs. - Follow up in 6 months for routine check-up.

## 2023-12-16 NOTE — Assessment & Plan Note (Signed)
 Recent labs showed an LDL of 108. Reports frequent dining out. No statin therapy is indicated at this time as the goal for initiating medication would be an LDL under 100, and she is only slightly above this. - Counselled on dietary modifications, including limiting saturated fat intake from red meats and dairy products like butter and cheese. - Encouraged to make healthier choices when dining out. - Advised on the importance of exercise.

## 2024-01-28 ENCOUNTER — Telehealth: Payer: Self-pay | Admitting: Cardiovascular Disease

## 2024-01-28 ENCOUNTER — Emergency Department (HOSPITAL_BASED_OUTPATIENT_CLINIC_OR_DEPARTMENT_OTHER)
Admission: EM | Admit: 2024-01-28 | Discharge: 2024-01-28 | Disposition: A | Discharge status: Home / Self Care | Attending: Emergency Medicine | Admitting: Emergency Medicine

## 2024-01-28 ENCOUNTER — Ambulatory Visit: Payer: Self-pay

## 2024-01-28 ENCOUNTER — Encounter (HOSPITAL_BASED_OUTPATIENT_CLINIC_OR_DEPARTMENT_OTHER): Payer: Self-pay | Admitting: Emergency Medicine

## 2024-01-28 ENCOUNTER — Other Ambulatory Visit: Payer: Self-pay

## 2024-01-28 DIAGNOSIS — Z85828 Personal history of other malignant neoplasm of skin: Secondary | ICD-10-CM | POA: Insufficient documentation

## 2024-01-28 DIAGNOSIS — R002 Palpitations: Secondary | ICD-10-CM | POA: Diagnosis not present

## 2024-01-28 DIAGNOSIS — M3500 Sicca syndrome, unspecified: Secondary | ICD-10-CM | POA: Diagnosis not present

## 2024-01-28 DIAGNOSIS — R739 Hyperglycemia, unspecified: Secondary | ICD-10-CM | POA: Insufficient documentation

## 2024-01-28 DIAGNOSIS — R030 Elevated blood-pressure reading, without diagnosis of hypertension: Secondary | ICD-10-CM | POA: Diagnosis present

## 2024-01-28 LAB — COMPREHENSIVE METABOLIC PANEL WITH GFR
ALT: 26 U/L (ref 0–44)
AST: 27 U/L (ref 15–41)
Albumin: 4.8 g/dL (ref 3.5–5.0)
Alkaline Phosphatase: 81 U/L (ref 38–126)
Anion gap: 14 (ref 5–15)
BUN: 8 mg/dL (ref 8–23)
CO2: 24 mmol/L (ref 22–32)
Calcium: 10.2 mg/dL (ref 8.9–10.3)
Chloride: 98 mmol/L (ref 98–111)
Creatinine, Ser: 0.65 mg/dL (ref 0.44–1.00)
GFR, Estimated: >60 mL/min (ref >60)
Glucose, Bld: 114 mg/dL — ABNORMAL HIGH (ref 70–99)
Potassium: 4 mmol/L (ref 3.5–5.1)
Sodium: 136 mmol/L (ref 135–145)
Total Bilirubin: 0.6 mg/dL (ref 0.0–1.2)
Total Protein: 7.7 g/dL (ref 6.5–8.1)

## 2024-01-28 LAB — CBC WITH DIFFERENTIAL/PLATELET
Abs Immature Granulocytes: 0.02 K/uL (ref 0.00–0.07)
Basophils Absolute: 0.1 K/uL (ref 0.0–0.1)
Basophils Relative: 1 %
Eosinophils Absolute: 0.1 K/uL (ref 0.0–0.5)
Eosinophils Relative: 1 %
HCT: 38.1 % (ref 36.0–46.0)
Hemoglobin: 12.9 g/dL (ref 12.0–15.0)
Immature Granulocytes: 0 %
Lymphocytes Relative: 23 %
Lymphs Abs: 1.7 K/uL (ref 0.7–4.0)
MCH: 30.3 pg (ref 26.0–34.0)
MCHC: 33.9 g/dL (ref 30.0–36.0)
MCV: 89.4 fL (ref 80.0–100.0)
Monocytes Absolute: 0.6 K/uL (ref 0.1–1.0)
Monocytes Relative: 8 %
Neutro Abs: 5.2 K/uL (ref 1.7–7.7)
Neutrophils Relative %: 67 %
Platelets: 284 K/uL (ref 150–400)
RBC: 4.26 MIL/uL (ref 3.87–5.11)
RDW: 12.6 % (ref 11.5–15.5)
WBC: 7.6 K/uL (ref 4.0–10.5)
nRBC: 0 % (ref 0.0–0.2)

## 2024-01-28 LAB — TSH: TSH: 3.63 u[IU]/mL (ref 0.350–4.500)

## 2024-01-28 NOTE — ED Provider Notes (Signed)
 [facility] DEPARTMENT AT [facility] Provider Note  CSN: [id number] Arrival date & time: 01/28/24 0348  Chief Complaint(s) Hypertension  HPI Bianca Fernandez is a 68 y.o. female with a past medical history listed below including Sjogren's disease here for elevated blood pressure noted this evening.  She reports having had intermittent palpitations throughout the day.  Patient had difficulty falling asleep this evening which prompted her to check her blood pressure in the middle of the night and noted that it was high with systolics greater than 900.  Patient denied any recent over-the-counter decongestant medicine.  No recent fevers or infections.  No cough or congestion.  No nausea or vomiting.  No abdominal pain.  No chest pain or shortness of breath.  No focal weakness or loss of sensation.   Patient reports that she has had these palpitations frequently in the past and evaluated by cardiology.  However she does not have a history of high blood pressure.   Hypertension    Past Medical History Past Medical History:  Diagnosis Date   Anemia    Resolved over past few years   Anxiety    situational   Arthritis    Osteoarthritis   Cancer (HCC)    Had facial skin cancer removed in 2011   Cataract    Optometrist noted approximately 6 years ago   Closed trimalleolar fracture of ankle, left, initial encounter 11/15/2018   Complication of anesthesia    Depression    Family history of adverse reaction to anesthesia    Mother - nausea, daughter nausea   GERD (gastroesophageal reflux disease)    Occasionally   Heart murmur 1961   VSD   Migraines    Osteoporosis    Related to osteoporosis   PONV (postoperative nausea and vomiting) 1976, 2001   Nausea    Sjogren's disease (HCC)    Unspecified vitamin D  deficiency    VSD (ventricular septal defect), perimembranous    Patient Active Problem List   Diagnosis Date Noted   Healthcare maintenance 12/16/2023   Chronic  RLQ pain 09/10/2023   Overweight (BMI 25.0-29.9) 01/25/2021   History of skin cancer 07/13/2020   Depression, major, recurrent, 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 Authorizing Provider  acetaminophen  (TYLENOL ) 325 MG tablet Take 2 tablets (650 mg total) by mouth every 6 (six) hours as needed for moderate pain. Patient taking differently: Take 500 mg by mouth every 6 (six) hours as needed for moderate pain (pain score 4-6). 05/08/22   Christopher Savannah, PA-C  estradiol (ESTRACE) 0.1 MG/GM vaginal cream Place 1 g vaginally 2 (two) times a week. 06/14/21   [provider]  ibuprofen (ADVIL) 800 MG tablet Take 800 mg by mouth every 6 (six) hours as needed for moderate pain. PRN    [provider]  Omega 3-6-9 Fatty Acids (OMEGA 3-6-9 COMPLEX PO) Take 1 tablet by mouth daily with lunch.     [provider]  Allergies Estradiol  Review of Systems Review of Systems As noted in HPI  Physical Exam Vital Signs  I have reviewed the triage vital signs BP (!) 154/78   Pulse 73   Temp 98.1 F (36.7 C) (Oral)   Resp 10   Ht 5' 8 (1.727 m)   Wt 75.3 kg   SpO2 97%   BMI 25.24 kg/m   Physical Exam Vitals reviewed.  Constitutional:      General: She is not in acute distress.    Appearance: She is well-developed. She is not diaphoretic.  HENT:     Head: Normocephalic and atraumatic.     Nose:  Nose normal.  Eyes:     General: No scleral icterus.       Right eye: No discharge.        Left eye: No discharge.     Conjunctiva/sclera: Conjunctivae normal.     Pupils: Pupils are equal, round, and reactive to light.  Cardiovascular:     Rate and Rhythm: Normal rate and regular rhythm.     Heart sounds: No murmur heard.    No friction rub. No gallop.  Pulmonary:     Effort: Pulmonary effort is normal. No respiratory distress.     Breath sounds: Normal breath sounds. No stridor. No rales.  Abdominal:     General: There is no distension.     Palpations: Abdomen is soft.     Tenderness: There is no abdominal tenderness.  Musculoskeletal:        General: No tenderness.     Cervical back: Normal range of motion and neck supple.  Skin:    General: Skin is warm and dry.     Findings: No erythema or rash.  Neurological:     Mental Status: She is alert and oriented to person, place, and time.     Comments: Mental Status:  Alert and oriented to person, place, and time.  Attention and concentration normal.  Speech clear.  Recent memory is intact  Cranial Nerves:  II Visual Fields: Intact to confrontation. Visual fields intact. III, IV, VI: Pupils equal and reactive to light and near. Full eye movement without nystagmus  V Facial Sensation: Normal. No weakness of masticatory muscles  VII: No facial weakness or asymmetry  VIII Auditory Acuity: Grossly normal  IX/X: The uvula is midline; the palate elevates symmetrically  XI: Normal sternocleidomastoid and trapezius strength  XII: The tongue is midline. No atrophy or fasciculations.   Motor System: Muscle Strength: 5/5 and symmetric in the upper and lower extremities. No pronation or drift.  Muscle Tone: Tone and muscle bulk are normal in the upper and lower extremities.   Coordination: Intact finger-to-nose. No tremor.  Sensation: Intact to light touch Gait: Routine  gait normal.      ED Results and Treatments Labs (all  labs ordered are listed, but only abnormal results are displayed) Labs Reviewed  COMPREHENSIVE METABOLIC PANEL WITH GFR - Abnormal; Notable for the following components:      Result Value   Glucose, Bld 114 (*)    All other components within normal limits  CBC WITH DIFFERENTIAL/PLATELET  TSH  EKG  EKG Interpretation Date/Time:  Thursday January 28 2024 04:55:59 EDT Ventricular Rate:  83 PR Interval:  154 QRS Duration:  95 QT Interval:  398 QTC Calculation: 468 R Axis:   33  Text Interpretation: Sinus rhythm Low voltage, precordial leads Confirmed by Trine Likes [phone]) on 01/28/2024 5:38:58 AM       Radiology No results found.  Medications Ordered in ED Medications - No data to display Procedures Procedures  (including critical care time) Medical Decision Making / ED Course   Medical Decision Making Amount and/or Complexity of Data Reviewed Labs: ordered. Decision-making details documented in ED Course. ECG/medicine tests: ordered and independent interpretation performed. Decision-making details documented in ED Course.    Hypertension.  Differential diagnosis considered Blood pressure was elevated with systolics in the 190s Relatively asymptomatic. No focal deficits on exam concerning for CVA No associated chest pain or shortness of breath concerning for cardiac stress. EKG without acute ischemic changes, dysrhythmias or blocks. CBC without leukocytosis or anemia.  CMP without significant electrolyte derangements or renal insufficiency.  Hyperglycemia without DKA. TSH normal  Without intervention, patient's blood pressure trended down to the 150s     Final Clinical Impression(s) / ED Diagnoses Final diagnoses:  Elevated blood pressure reading   The patient appears reasonably screened and/or stabilized for discharge and I doubt any other  medical condition or other [facility] requiring further screening, evaluation, or treatment in the ED at this time. I have discussed the findings, Dx and Tx plan with the patient/family who expressed understanding and agree(s) with the plan. Discharge instructions discussed at length. The patient/family was given strict return precautions who verbalized understanding of the instructions. No further questions at time of discharge.  Disposition: Discharge  Condition: Good  ED Discharge Orders     None         Follow Up: Chandra Toribio POUR, MD [address] [place] [address] [phone]  Call  to schedule an appointment for close follow up    This chart was dictated using voice recognition software.  Despite best efforts to proofread,  errors can occur which can change the documentation meaning.    Trine Likes Moder, MD 01/28/24 [phone]

## 2024-01-28 NOTE — Telephone Encounter (Signed)
 Spoke with pt regarding her elevated blood pressure. Pt stated she could not sleep last night and decided to take her blood pressure and found that is was 192/105, heart rate of 101. Pt did not have any symptoms other than nausea. Pt went to the ED at [facility] at 3:30 AM where they did testing but found nothing out of the ordinary and was sent home. No medications were given. Pt called her PCP and was told to go back to the ED. Pt decided to call us  but has not seen a doctor since 2023. Pt took her blood pressure while on the phone and it was 173/99 with a pulse of 98. Pt stated she has been anxious and stressed lately and feels like her heart is fluttering occasionally. Pt was told the information provided would be sent to the DOD, Dr. [place] for review. Pt verbalized understanding. All questions if any were answered.

## 2024-01-28 NOTE — Telephone Encounter (Signed)
 Pt was given Dr. Gib suggestion to follow up with her PCP. Pt was given ED precautions. Pt verbalized understanding. All questions if any were answered.

## 2024-01-28 NOTE — Telephone Encounter (Signed)
 Pt c/o BP issue: STAT if pt c/o blurred vision, one-sided weakness or slurred speech.  STAT if BP is GREATER than 180/120 TODAY.  STAT if BP is LESS than 90/60 and SYMPTOMATIC TODAY  1. What is your BP concern? elevated  2. Have you taken any BP medication today?no doesn't take medication for BP  3. What are your last 5 BP readings? 163/97 101, 166/96 97, 164/98 98, 160/98 107,192/105 101  4. Are you having any other symptoms (ex. Dizziness, headache, blurred vision, passed out)? Back pain, heart racing

## 2024-01-28 NOTE — ED Notes (Signed)
 Conducted pulse oximetry with ambulation; patient tolerated well; HR 96-98 bpm, O2 sat 97-99%, patient denies dyspnea/ nor dizziness

## 2024-01-28 NOTE — ED Notes (Signed)
 Pt discharged home and given discharge paperwork. Opportunities given for questions. Pt verbalizes understanding. PIV removed x1. Bethena Powell SAUNDERS , RN

## 2024-01-28 NOTE — Telephone Encounter (Signed)
 FYI Only or Action Required?: Action required by provider: Patient requesting a follow up appointment.  Patient was last seen in primary care on 12/15/2023 by Chandra Toribio POUR, MD.  Called Nurse Triage reporting Hypertension.  Symptoms began today.  Interventions attempted: Nothing.  Symptoms are: gradually worsening.  Triage Disposition: Go to ED Now (Notify PCP)  Patient/caregiver understands and will follow disposition?: Yes       Copied from CRM [id number]. Topic: Clinical - Red Word Triage >> Jan 28, 2024  9:41 AM Pinkey ORN wrote: Red Word that prompted transfer to Nurse Triage: Elevated Blood Pressure >> Jan 28, 2024  9:42 AM Pinkey ORN wrote: Patient states her blood pressure is currently 180/103. Reason for Disposition  [1] Systolic BP >= 160 OR Diastolic >= 100 AND [2] cardiac (e.g., breathing difficulty, chest pain) or neurologic symptoms (e.g., new-onset blurred or double vision, unsteady gait)  Answer Assessment - Initial Assessment Questions Patient states she was unable to go to sleep last night due to heart fluttering, she took her BP and it was  199/ over something. She states her heart feels like it is beating fast. Patient states her heart rate was 108 at 0200 ; HR at 95-101 at time of call. No appointments in office for today, pt would like to schedule a follow up appoint with her PCP as well. Patient denies chest pain, shortness of breath, dizziness blurred vision, difficulty breathing,or weakness. Patient to ED, she states she has her husband to drive her.     1. BLOOD PRESSURE: What is your blood pressure? Did you take at least two measurements 5 minutes apart?     180/103; 192/105 HR 101 2. ONSET: When did you take your blood pressure?     This morning around 0200 3. HOW: How did you take your blood pressure? (e.g., automatic home BP monitor, visiting nurse)     Automatic home BP monitor  4. HISTORY: Do you have a history of high blood  pressure?     Denies  5. MEDICINES: Are you taking any medicines for blood pressure? Have you missed any doses recently?     Denies  6. OTHER SYMPTOMS: Do you have any symptoms? (e.g., blurred vision, chest pain, difficulty breathing, headache, weakness)     Slight headache and some back pain.  Protocols used: Blood Pressure - High-A-AH

## 2024-01-28 NOTE — ED Triage Notes (Signed)
  Patient comes in with hypertension that has been going on since yesterday morning around 1000.  Patient states she has not felt like herself throughout the day.  States BP has been higher than normal, and HR has intermittently been high.  Denies any SOB, dizziness, or syncope.  Denies any pain at this time.

## 2024-01-28 NOTE — Telephone Encounter (Signed)
 Can you call the patient to schedule a nurse visit tomorrow so we can check her vitals and also set her up with a sooner appointment with me?

## 2024-01-29 ENCOUNTER — Ambulatory Visit (INDEPENDENT_AMBULATORY_CARE_PROVIDER_SITE_OTHER): Admitting: Family Medicine

## 2024-01-29 VITALS — BP 158/74 | HR 106 | Wt 166.8 lb

## 2024-01-29 DIAGNOSIS — I10 Essential (primary) hypertension: Secondary | ICD-10-CM | POA: Diagnosis not present

## 2024-01-29 MED ORDER — AMLODIPINE BESYLATE 2.5 MG PO TABS: 2.5000 mg | ORAL_TABLET | Freq: Every day | ORAL | 3 refills | Status: DC

## 2024-01-29 NOTE — Progress Notes (Signed)
 Pt agreeable to start amlodipine  2.5mg  daily.  Her home readings started to increase over the past several weeks.  Home machine was compared to our machine in the office and they were similar.

## 2024-02-04 ENCOUNTER — Ambulatory Visit: Payer: Medicare Other | Admitting: Nurse Practitioner

## 2024-02-08 ENCOUNTER — Encounter: Payer: Self-pay | Admitting: Cardiovascular Disease

## 2024-02-08 ENCOUNTER — Ambulatory Visit: Attending: Cardiovascular Disease | Admitting: Cardiovascular Disease

## 2024-02-08 VITALS — BP 160/89 | HR 110 | Ht 68.0 in | Wt 167.0 lb

## 2024-02-08 DIAGNOSIS — I10 Essential (primary) hypertension: Secondary | ICD-10-CM | POA: Insufficient documentation

## 2024-02-08 DIAGNOSIS — Q210 Ventricular septal defect: Secondary | ICD-10-CM | POA: Insufficient documentation

## 2024-02-08 DIAGNOSIS — R030 Elevated blood-pressure reading, without diagnosis of hypertension: Secondary | ICD-10-CM | POA: Insufficient documentation

## 2024-02-08 NOTE — Patient Instructions (Addendum)
 Medication Instructions:   Your physician recommends that you continue on your current medications as directed. Please refer to the Current Medication list given to you today.   *If you need a refill on your cardiac medications before your next appointment, please call your pharmacy*    Lab Work: NONE ORDERED  TODAY    If you have labs (blood work) drawn today and your tests are completely normal, you will receive your results only by: MyChart Message (if you have MyChart) OR A paper copy in the mail If you have any lab test that is abnormal or we need to change your treatment, we will call you to review the results.    Testing/Procedures: NONE ORDERED  TODAY\    Follow-Up: At [facility], you and your health needs are our priority.  As part of our continuing mission to provide you with exceptional heart care, our providers are all part of one team.  This team includes your primary Cardiologist (physician) and Advanced Practice Providers or APPs (Physician Assistants and Nurse Practitioners) who all work together to provide you with the care you need, when you need it.   Your next appointment:    1 year(s)   Provider:    Jerel Balding, MD   We recommend signing up for the patient portal called MyChart.  Sign up information is provided on this After Visit Summary.  MyChart is used to connect with patients for Virtual Visits (Telemedicine).  Patients are able to view lab/test results, encounter notes, upcoming appointments, etc.  Non-urgent messages can be sent to your provider as well.   To learn more about what you can do with MyChart, go to [email or web address].   Other Instructions

## 2024-02-08 NOTE — Progress Notes (Signed)
 Patient ID: Bianca Fernandez, female   DOB: 04/30/1956, 68 y.o.   MRN: [id number]       Cardiology Office Note   Date:  02/08/2024   ID:  Bianca Fernandez, Bianca Fernandez [birth year], MRN [id number]  PCP:  Bianca Toribio MARLA, MD  Cardiologist:   Bianca Balding, MD   No chief complaint on file.     History of Present Illness: MARSHELLE Fernandez is a 68 y.o. female who presents in follow-up for a small perimembranous ventricular septal defect and Sjogren's disease.  She has well-documented situational hypertension, in addition to mild essential hypertension.  Generally she is doing quite well.  She has noticed that she gets more more anxious as she gets older, but does not have cardiovascular complaints.  Does not have chest pain at rest or with activity.  She has not had dizziness, palpitations or syncope.  She does not have peripheral edema, claudication or focal neurological complaints.  A couple of years ago, she had complaints of exertional dyspnea, although these are less prominent today.    In 2023, because of a false positive treadmill stress test, we performed a coronary CT angiogram which showed no evidence of coronary artery stenoses and the calcium score was 0.  It identified the very small, restrictive VSD (4 mm in diameter with an accompanying membranous septum aneurysm.  The right heart chambers were not enlarged.  Her most recent echocardiogram was also performed in May 2023 and was a normal study except for the VSD.  Her blood pressure and heart rate were fast when she checked in today.  They stayed relatively elevated even 15 minutes later during my exam.  She brought a log of her blood pressure over the last 2 weeks at home which has been mostly in the 110/70-120/79 range, with typical heart rates in the low 80s.  Past Medical History:  Diagnosis Date   Anemia    Resolved over past few years   Anxiety    situational   Arthritis    Osteoarthritis   Cancer (HCC)    Had facial skin cancer removed  in 2011   Cataract    Optometrist noted approximately 6 years ago   Closed trimalleolar fracture of ankle, left, initial encounter 11/15/2018   Complication of anesthesia    Depression    Family history of adverse reaction to anesthesia    Mother - nausea, daughter nausea   GERD (gastroesophageal reflux disease)    Occasionally   Heart murmur 1961   VSD   Migraines    Osteoporosis    Related to osteoporosis   PONV (postoperative nausea and vomiting) 1976, 2001   Nausea    Sjogren's disease    Unspecified vitamin D  deficiency    VSD (ventricular septal defect), perimembranous     Past Surgical History:  Procedure Laterality Date   ABLATION ON ENDOMETRIOSIS     CARDIAC CATHETERIZATION  1976   cervical conization  1976   COLONOSCOPY  2012   DILATION AND CURETTAGE OF UTERUS     x 2   ESOPHAGOGASTRODUODENOSCOPY  01/28/2013   Dr Bianca Fernandez- dysmotility   FRACTURE SURGERY  2020   Broken ankle corrected with surgery   lip biopsy  06/2011   + sjogrens, Dr. Benigno at [facility]   ORIF ANKLE FRACTURE Left 11/17/2018   Procedure: OPEN REDUCTION INTERNAL FIXATION (ORIF) LEFT ANKLE FRACTURE;  Surgeon: Bianca Jerona GAILS, MD;  Location: [facility] OR;  Service: Orthopedics;  Laterality: Left;  Current Outpatient Medications  Medication Sig Dispense Refill   amLODipine  (NORVASC ) 2.5 MG tablet Take 1 tablet (2.5 mg total) by mouth daily. 30 tablet 3   estradiol (ESTRACE) 0.1 MG/GM vaginal cream Place 1 g vaginally 2 (two) times a week.     ibuprofen (ADVIL) 800 MG tablet Take 800 mg by mouth every 6 (six) hours as needed for moderate pain. PRN     Omega 3-6-9 Fatty Acids (OMEGA 3-6-9 COMPLEX PO) Take 1 tablet by mouth daily with lunch.      acetaminophen  (TYLENOL ) 325 MG tablet Take 2 tablets (650 mg total) by mouth every 6 (six) hours as needed for moderate pain. (Patient taking differently: Take 500 mg by mouth every 6 (six) hours as needed for moderate pain (pain score 4-6).) 30 tablet 0   No  current facility-administered medications for this visit.    Allergies:   Estradiol    Social History:  The patient  reports that she has never smoked. She has never used smokeless tobacco. She reports that she does not currently use alcohol. She reports that she does not use drugs.   Family History:  The patient's family history includes ALS in an other family member; Arthritis in her mother; Bladder Cancer in her father; Breast cancer in her cousin; COPD in her mother; Cancer in her father; Hypertension in her father and mother; Multiple sclerosis in her cousin; Ovarian cancer in her maternal aunt; Parkinson's disease in her cousin; Stroke in her father and mother; Uterine cancer in her maternal grandmother; Valvular heart disease in her mother.    ROS:  Please see the history of present illness.    Otherwise, review of systems positive for none.   All other systems are reviewed and negative.    PHYSICAL EXAM: VS:  Ht 5' 8 (1.727 m)   Wt 167 lb (75.8 kg)   BMI 25.39 kg/m  , BMI Body mass index is 25.39 kg/m.   General: Alert, oriented x3, no distress, appears lean Head: no evidence of trauma, PERRL, EOMI, no exophtalmos or lid lag, no myxedema, no xanthelasma; normal ears, nose and oropharynx Neck: normal jugular venous pulsations and no hepatojugular reflux; brisk carotid pulses without delay and no carotid bruits Chest: clear to auscultation, no signs of consolidation by percussion or palpation, normal fremitus, symmetrical and full respiratory excursions Cardiovascular: normal position and quality of the apical impulse, regular rhythm, normal first and second heart sounds, 3/6 holosystolic murmur heard broadly throughout the entire precordium, no diastolic murmurs, rubs or gallops Abdomen: no tenderness or distention, no masses by palpation, no abnormal pulsatility or arterial bruits, normal bowel sounds, no hepatosplenomegaly Extremities: no clubbing, cyanosis or edema; 2+  radial, ulnar and brachial pulses bilaterally; 2+ right femoral, posterior tibial and dorsalis pedis pulses; 2+ left femoral, posterior tibial and dorsalis pedis pulses; no subclavian or femoral bruits Neurological: grossly nonfocal Psych: Normal mood and affect     EKG: Personally reviewed the most recent ECG from 02/01/2024 which shows sinus rhythm, normal tracing  EKG Interpretation Date/Time:    Ventricular Rate:    PR Interval:    QRS Duration:    QT Interval:    QTC Calculation:   R Axis:      Text Interpretation:           Recent Labs: 01/28/2024: ALT 26; BUN 8; Creatinine, Ser 0.65; Hemoglobin 12.9; Platelets 284; Potassium 4.0; Sodium 136; TSH 3.630    Lipid Panel    Component Value Date/Time  CHOL 186 12/07/2023 0906   TRIG 82 12/07/2023 0906   HDL 63 12/07/2023 0906   CHOLHDL 3.0 12/07/2023 0906   CHOLHDL 2.7 02/04/2023 1203   VLDL 13 05/26/2016 1411   LDLCALC 108 (H) 12/07/2023 0906   LDLCALC 94 02/04/2023 1203      Wt Readings from Last 3 Encounters:  02/08/24 167 lb (75.8 kg)  01/29/24 166 lb 12 oz (75.6 kg)  01/28/24 166 lb (75.3 kg)      ASSESSMENT AND PLAN:  VSD: Small perimembranous VSD measuring 4 mm in diameter on CT, without any signs of right ventricular enlargement or pulmonary hypertension.  Last echo was checked in 2023, barring any change in symptoms would probably not look again for about 5 years. HTN: Very well-controlled on a very low-dose of amlodipine .  She also has quite significant situational/whitecoat hypertension. Modest elevation of LDL cholesterol with an excellent HDL and with a coronary calcium score 0 at age 1.  No therapy is recommended.    Current medicines are reviewed at length with the patient today.  The patient does not have concerns regarding medicines.  The following changes have been made:  no change  Labs/ tests ordered today include:   No orders of the defined types were placed in this  encounter.    There are no Patient Instructions on file for this visit.    Signed, Bianca Balding, MD  02/08/2024 9:30 AM    Bianca Balding, MD, [facility] [phone] office [phone] pager

## 2024-03-11 ENCOUNTER — Encounter: Payer: Self-pay | Admitting: Family Medicine

## 2024-03-19 ENCOUNTER — Emergency Department (HOSPITAL_BASED_OUTPATIENT_CLINIC_OR_DEPARTMENT_OTHER)
Admission: EM | Admit: 2024-03-19 | Discharge: 2024-03-19 | Disposition: A | Discharge status: Home / Self Care | Attending: Emergency Medicine | Admitting: Emergency Medicine

## 2024-03-19 ENCOUNTER — Encounter (HOSPITAL_BASED_OUTPATIENT_CLINIC_OR_DEPARTMENT_OTHER): Payer: Self-pay

## 2024-03-19 ENCOUNTER — Emergency Department (HOSPITAL_BASED_OUTPATIENT_CLINIC_OR_DEPARTMENT_OTHER)

## 2024-03-19 ENCOUNTER — Other Ambulatory Visit: Payer: Self-pay

## 2024-03-19 DIAGNOSIS — N3000 Acute cystitis without hematuria: Secondary | ICD-10-CM | POA: Insufficient documentation

## 2024-03-19 DIAGNOSIS — Z85828 Personal history of other malignant neoplasm of skin: Secondary | ICD-10-CM | POA: Diagnosis not present

## 2024-03-19 DIAGNOSIS — R109 Unspecified abdominal pain: Secondary | ICD-10-CM

## 2024-03-19 LAB — URINALYSIS, ROUTINE W REFLEX MICROSCOPIC
Bilirubin Urine: NEGATIVE
Glucose, UA: NEGATIVE mg/dL
Hgb urine dipstick: NEGATIVE
Ketones, ur: 15 mg/dL — AB
Nitrite: NEGATIVE
Specific Gravity, Urine: 1.025 (ref 1.005–1.030)
pH: 6 (ref 5.0–8.0)

## 2024-03-19 LAB — COMPREHENSIVE METABOLIC PANEL WITH GFR
ALT: 27 U/L (ref 0–44)
AST: 27 U/L (ref 15–41)
Albumin: 5.1 g/dL — ABNORMAL HIGH (ref 3.5–5.0)
Alkaline Phosphatase: 88 U/L (ref 38–126)
Anion gap: 12 (ref 5–15)
BUN: 8 mg/dL (ref 8–23)
CO2: 27 mmol/L (ref 22–32)
Calcium: 10.4 mg/dL — ABNORMAL HIGH (ref 8.9–10.3)
Chloride: 97 mmol/L — ABNORMAL LOW (ref 98–111)
Creatinine, Ser: 0.68 mg/dL (ref 0.44–1.00)
GFR, Estimated: >60 mL/min (ref >60)
Glucose, Bld: 110 mg/dL — ABNORMAL HIGH (ref 70–99)
Potassium: 4.4 mmol/L (ref 3.5–5.1)
Sodium: 135 mmol/L (ref 135–145)
Total Bilirubin: 0.8 mg/dL (ref 0.0–1.2)
Total Protein: 8.1 g/dL (ref 6.5–8.1)

## 2024-03-19 LAB — CBC
HCT: 40.7 % (ref 36.0–46.0)
Hemoglobin: 13.8 g/dL (ref 12.0–15.0)
MCH: 30.4 pg (ref 26.0–34.0)
MCHC: 33.9 g/dL (ref 30.0–36.0)
MCV: 89.6 fL (ref 80.0–100.0)
Platelets: 284 K/uL (ref 150–400)
RBC: 4.54 MIL/uL (ref 3.87–5.11)
RDW: 12.3 % (ref 11.5–15.5)
WBC: 10.1 K/uL (ref 4.0–10.5)
nRBC: 0 % (ref 0.0–0.2)

## 2024-03-19 LAB — LIPASE, BLOOD: Lipase: 23 U/L (ref 11–51)

## 2024-03-19 MED ORDER — CEFDINIR 300 MG PO CAPS: 300.0000 mg | ORAL_CAPSULE | Freq: Two times a day (BID) | ORAL | 0 refills | Status: AC

## 2024-03-19 MED ORDER — TRAMADOL HCL 50 MG PO TABS: 50.0000 mg | ORAL_TABLET | Freq: Four times a day (QID) | ORAL | 0 refills | Status: DC | PRN

## 2024-03-19 MED ORDER — IOHEXOL 300 MG/ML  SOLN
80.0000 mL | Freq: Once | INTRAMUSCULAR | Status: AC | PRN
  Administered 2024-03-19: 80 mL via INTRAVENOUS

## 2024-03-19 NOTE — ED Notes (Signed)
 Discharge instructions reviewed with patient. Patient questions answered and opportunity for education reviewed. Patient voices understanding of discharge instructions with no further questions. Patient ambulatory with steady gait to lobby.

## 2024-03-19 NOTE — Discharge Instructions (Signed)
 It was a pleasure caring for you today in the emergency department.  Your workup today is revealing for a urinary tract infection. I have prescribed you antibiotics to treat this. Be sure to get plenty of rest, drink plenty of liquids over the next few days and follow up with your pcp for ongoing care.   Please return to the emergency department for any worsening or worrisome symptoms including but not limited to fever, vomiting, severe abdominal pain, etc.

## 2024-03-19 NOTE — ED Provider Notes (Signed)
 [place] EMERGENCY DEPARTMENT AT [facility] Provider Note  CSN: [id number] Arrival date & time: 03/19/24 1126  Chief Complaint(s) Abdominal Pain and Back Pain  HPI Bianca Fernandez is a 68 y.o. female with past medical history as below, significant for depression, GERD, migraines, anxiety, Sjogren's disease who presents to the ED with complaint of abdominal pain, back pain  Symptoms ongoing over the past week, suprapubic pain, right-sided flank pain.  Diarrhea.  No BRBPR or melena.  Increased urinary frequency and urgency.  No nausea or vomiting.  No fevers or chills.  Pain is sharp and stabbing, aching in nature.  No recent travel or sick contacts.  Some relief with APAP and ibuprofen.  Symptoms significant improved while in treatment area.  Past Medical History Past Medical History:  Diagnosis Date   Anemia    Resolved over past few years   Anxiety    situational   Arthritis    Osteoarthritis   Cancer (HCC)    Had facial skin cancer removed in 2011   Cataract    Optometrist noted approximately 6 years ago   Closed trimalleolar fracture of ankle, left, initial encounter 11/15/2018   Complication of anesthesia    Depression    Family history of adverse reaction to anesthesia    Mother - nausea, daughter nausea   GERD (gastroesophageal reflux disease)    Occasionally   Heart murmur 1961   VSD   Migraines    Osteoporosis    Related to osteoporosis   PONV (postoperative nausea and vomiting) 1976, 2001   Nausea    Sjogren's disease    Unspecified vitamin D  deficiency    VSD (ventricular septal defect), perimembranous    Patient Active Problem List   Diagnosis Date Noted   Healthcare maintenance 12/16/2023   Chronic RLQ pain 09/10/2023   Overweight (BMI 25.0-29.9) 01/25/2021   History of skin cancer 07/13/2020   Depression, major, recurrent, in complete remission 10/20/2019   Rectocele, female 09/12/2014   Cystocele with prolapse 09/12/2014   Abnormal glucose  (prediabetes) 09/12/2014   Hyperlipidemia 09/12/2014   Elevated blood pressure reading without diagnosis of hypertension 09/12/2014   Sjogren's disease    VSD (ventricular septal defect), perimembranous    Osteoporosis    Vitamin D  deficiency    Migraines    Home Medication(s) Prior to Admission medications   Medication Sig Start Date End Date Taking? Authorizing Provider  cefdinir (OMNICEF) 300 MG capsule Take 1 capsule (300 mg total) by mouth 2 (two) times daily for 7 days. 03/19/24 03/26/24 Yes Elnor Jayson LABOR, DO  traMADol (ULTRAM) 50 MG tablet Take 1 tablet (50 mg total) by mouth every 6 (six) hours as needed for moderate pain (pain score 4-6). 03/19/24  Yes Elnor Jayson A, DO  amLODipine  (NORVASC ) 2.5 MG tablet Take 1 tablet (2.5 mg total) by mouth daily. 01/29/24   Chandra Toribio MARLA, MD  estradiol (ESTRACE) 0.1 MG/GM vaginal cream Place 1 g vaginally 2 (two) times a week. 06/14/21   [provider]  ibuprofen (ADVIL) 800 MG tablet Take 800 mg by mouth every 6 (six) hours as needed for moderate pain. PRN    [provider]  Omega 3-6-9 Fatty Acids (OMEGA 3-6-9 COMPLEX PO) Take 1 tablet by mouth daily with lunch.     [provider]  Past Surgical History Past Surgical History:  Procedure Laterality Date   ABLATION ON ENDOMETRIOSIS     CARDIAC CATHETERIZATION  1976   cervical conization  1976   COLONOSCOPY  2012   DILATION AND CURETTAGE OF UTERUS     x 2   ESOPHAGOGASTRODUODENOSCOPY  01/28/2013   Dr Dianna- dysmotility   FRACTURE SURGERY  2020   Broken ankle corrected with surgery   lip biopsy  06/2011   + sjogrens, Dr. Benigno at [facility]   ORIF ANKLE FRACTURE Left 11/17/2018   Procedure: OPEN REDUCTION INTERNAL FIXATION (ORIF) LEFT ANKLE FRACTURE;  Surgeon: Harden Jerona GAILS, MD;  Location: [facility] OR;  Service: Orthopedics;  Laterality:  Left;   Family History Family History  Problem Relation Age of Onset   Hypertension Mother    Stroke Mother    Valvular heart disease Mother    Arthritis Mother    COPD Mother    Hypertension Father    Stroke Father    Bladder Cancer Father        worked in [employer] (physical [employer]) Father    Uterine cancer Maternal Grandmother    Ovarian cancer Maternal Aunt    Breast cancer Cousin    Multiple sclerosis Cousin    Parkinson's disease Cousin    ALS Other     Social History Social History   Tobacco Use   Smoking status: Never   Smokeless tobacco: Never  Vaping Use   Vaping status: Never Used  Substance Use Topics   Alcohol use: Not Currently   Drug use: No   Allergies Estradiol  Review of Systems A thorough review of systems was obtained and all systems are negative except as noted in the HPI and PMH.   Physical Exam Vital Signs  I have reviewed the triage vital signs BP (!) 161/88   Pulse 79   Temp 98 F (36.7 C) (Oral)   Resp 15   Ht 5' 8 (1.727 m)   Wt 74.4 kg   SpO2 99%   BMI 24.94 kg/m  Physical Exam Vitals and nursing note reviewed.  Constitutional:      General: She is not in acute distress.    Appearance: Normal appearance. She is well-developed. She is not ill-appearing.  HENT:     Head: Normocephalic and atraumatic.     Right Ear: External ear normal.     Left Ear: External ear normal.     Nose: Nose normal.     Mouth/Throat:     Mouth: Mucous membranes are moist.  Eyes:     General: No scleral icterus.       Right eye: No discharge.        Left eye: No discharge.  Cardiovascular:     Rate and Rhythm: Tachycardia present.  Pulmonary:     Effort: Pulmonary effort is normal. No respiratory distress.     Breath sounds: No stridor.  Abdominal:     General: Abdomen is flat. There is no distension.     Palpations: Abdomen is soft.     Tenderness: There is abdominal tenderness in the suprapubic area. There is no guarding or rebound.   Musculoskeletal:        General: No deformity.     Cervical back: No rigidity.     Comments: No midline ttp No crepitance or stepoff deformity No cva ttp   Skin:    General: Skin is warm and dry.     Coloration: Skin is not cyanotic, jaundiced  or pale.  Neurological:     Mental Status: She is alert.  Psychiatric:        Speech: Speech normal.        Behavior: Behavior normal. Behavior is cooperative.     ED Results and Treatments Labs (all labs ordered are listed, but only abnormal results are displayed) Labs Reviewed  URINE CULTURE - Abnormal; Notable for the following components:      Result Value   Culture   (*)    Value: <10,000 COLONIES/mL INSIGNIFICANT GROWTH Performed at [facility], [address], [place], [place] [address]    All other components within normal limits  COMPREHENSIVE METABOLIC PANEL WITH GFR - Abnormal; Notable for the following components:   Chloride 97 (*)    Glucose, Bld 110 (*)    Calcium 10.4 (*)    Albumin 5.1 (*)    All other components within normal limits  URINALYSIS, ROUTINE W REFLEX MICROSCOPIC - Abnormal; Notable for the following components:   APPearance HAZY (*)    Ketones, ur 15 (*)    Protein, ur TRACE (*)    Leukocytes,Ua MODERATE (*)    Bacteria, UA MANY (*)    Crystals PRESENT (*)    All other components within normal limits  LIPASE, BLOOD  CBC                                                                                                                          Radiology No results found.  Pertinent labs & imaging results that were available during my care of the patient were reviewed by me and considered in my medical decision making (see MDM for details).  Medications Ordered in ED Medications  iohexol  (OMNIPAQUE ) 300 MG/ML solution 80 mL (80 mLs Intravenous Contrast Given 03/19/24 1350)                                                                                                                                      Procedures Procedures  (including critical care time)  Medical Decision Making / ED Course    Medical Decision Making:    Bianca Fernandez is a 68 y.o. female with past medical history as below, significant for depression, GERD, migraines, anxiety, Sjogren's disease who presents to the ED with complaint of abdominal pain, back pain. The complaint involves an extensive differential diagnosis and also carries  with it a high risk of complications and morbidity.  Serious etiology was considered. Ddx includes but is not limited to: Differential diagnosis includes but is not exclusive to ectopic pregnancy, ovarian cyst, ovarian torsion, acute appendicitis, urinary tract infection, endometriosis, bowel obstruction, hernia, colitis, renal colic, gastroenteritis, volvulus etc.   Complete initial physical exam performed, notably the patient was in no acute distress.    Reviewed and confirmed nursing documentation for past medical history, family history, social history.  Vital signs reviewed.    Lower abdominal pain Diarrhea> -No vomiting, no recent antibiotics.  Having some semiformed stool. - Abdomen is TTP suprapubic, lower quadrants.  Nonperitoneal, soft. - CT abdomen pelvis stable  acute cystitis> - Urinalysis concerning for UTI, she is have increased urgency and frequency -Not septic, send urine culture, start antibiotics - no cva ttp, no n/v, not septic, doubt pyelo clinically   Will cover for acute cystitis, she is not septic.  She is well-appearing, she is Tonpular difficulty.  Sent urine culture.   I have discussed the diagnosis/risks/treatment options with the patient and family.  Evaluation and diagnostic testing in the emergency department does not suggest an emergent condition requiring admission or immediate intervention beyond what has been performed at this time.  They will follow up with pcp. We also discussed returning to the ED immediately if new or worsening sx  occur. We discussed the sx which are most concerning (e.g., sudden worsening pain, fever, inability to tolerate by mouth) that necessitate immediate return.    The patient appears reasonably screened and/or stabilized for discharge and I doubt any other medical condition or other [facility] requiring further screening, evaluation, or treatment in the ED at this time prior to discharge.                      Additional history obtained: -Additional history obtained from family -External records from outside source obtained and reviewed including: Chart review including previous notes, labs, imaging, consultation notes including  Home meds Allergy list   Lab Tests: -I ordered, reviewed, and interpreted labs.   The pertinent results include:   Labs Reviewed  URINE CULTURE - Abnormal; Notable for the following components:      Result Value   Culture   (*)    Value: <10,000 COLONIES/mL INSIGNIFICANT GROWTH Performed at [facility], [address], [place], [place] [address]    All other components within normal limits  COMPREHENSIVE METABOLIC PANEL WITH GFR - Abnormal; Notable for the following components:   Chloride 97 (*)    Glucose, Bld 110 (*)    Calcium 10.4 (*)    Albumin 5.1 (*)    All other components within normal limits  URINALYSIS, ROUTINE W REFLEX MICROSCOPIC - Abnormal; Notable for the following components:   APPearance HAZY (*)    Ketones, ur 15 (*)    Protein, ur TRACE (*)    Leukocytes,Ua MODERATE (*)    Bacteria, UA MANY (*)    Crystals PRESENT (*)    All other components within normal limits  LIPASE, BLOOD  CBC    Notable for as above  EKG   EKG Interpretation Date/Time:    Ventricular Rate:    PR Interval:    QRS Duration:    QT Interval:    QTC Calculation:   R Axis:      Text Interpretation:           Imaging Studies ordered: I ordered imaging studies including  ctap I independently visualized the following imaging with  scope of interpretation limited to determining acute life threatening conditions related to emergency care; findings noted above I agree with the radiologist interpretation If any imaging was obtained with contrast I closely monitored patient for any possible adverse reaction a/w contrast administration in the emergency department   Medicines ordered and prescription drug management: Meds ordered this encounter  Medications   iohexol  (OMNIPAQUE ) 300 MG/ML solution 80 mL   cefdinir (OMNICEF) 300 MG capsule    Sig: Take 1 capsule (300 mg total) by mouth 2 (two) times daily for 7 days.    Dispense:  14 capsule    Refill:  0   traMADol (ULTRAM) 50 MG tablet    Sig: Take 1 tablet (50 mg total) by mouth every 6 (six) hours as needed for moderate pain (pain score 4-6).    Dispense:  15 tablet    Refill:  0    -I have reviewed the patients home medicines and have made adjustments as needed   Consultations Obtained: na   Cardiac Monitoring: Continuous pulse oximetry interpreted by myself, 99% on RA.    Social Determinants of Health:  Diagnosis or treatment significantly limited by social determinants of health: na   Reevaluation: After the interventions noted above, I reevaluated the patient and found that they have improved  Co morbidities that complicate the patient evaluation  Past Medical History:  Diagnosis Date   Anemia    Resolved over past few years   Anxiety    situational   Arthritis    Osteoarthritis   Cancer (HCC)    Had facial skin cancer removed in 2011   Cataract    Optometrist noted approximately 6 years ago   Closed trimalleolar fracture of ankle, left, initial encounter 11/15/2018   Complication of anesthesia    Depression    Family history of adverse reaction to anesthesia    Mother - nausea, daughter nausea   GERD (gastroesophageal reflux disease)    Occasionally   Heart murmur 1961   VSD   Migraines    Osteoporosis    Related to osteoporosis    PONV (postoperative nausea and vomiting) 1976, 2001   Nausea    Sjogren's disease    Unspecified vitamin D  deficiency    VSD (ventricular septal defect), perimembranous       Dispostion: Disposition decision including need for hospitalization was considered, and patient discharged from emergency department.    Final Clinical Impression(s) / ED Diagnoses Final diagnoses:  Acute cystitis without hematuria  Abdominal pain, unspecified abdominal location        Elnor Jayson LABOR, DO 03/25/24 1738

## 2024-03-19 NOTE — ED Triage Notes (Signed)
 Lower back and abd pain. Loose stools and diarrhea. Denies nausea, vomiting, dysuria, hematuria, or melena. She has been alternating tylenol  and ibuprofen with mild relief.

## 2024-03-20 LAB — URINE CULTURE: Culture: <10000 — AB

## 2024-04-05 ENCOUNTER — Encounter: Payer: Self-pay | Admitting: Family Medicine

## 2024-04-05 ENCOUNTER — Ambulatory Visit (INDEPENDENT_AMBULATORY_CARE_PROVIDER_SITE_OTHER): Admitting: Family Medicine

## 2024-04-05 VITALS — BP 121/73 | HR 89 | Ht 68.0 in | Wt 163.8 lb

## 2024-04-05 DIAGNOSIS — N814 Uterovaginal prolapse, unspecified: Secondary | ICD-10-CM | POA: Diagnosis not present

## 2024-04-05 DIAGNOSIS — R102 Pelvic and perineal pain unspecified side: Secondary | ICD-10-CM | POA: Diagnosis not present

## 2024-04-05 DIAGNOSIS — M533 Sacrococcygeal disorders, not elsewhere classified: Secondary | ICD-10-CM

## 2024-04-05 MED ORDER — CELECOXIB 100 MG PO CAPS: 100.0000 mg | ORAL_CAPSULE | Freq: Two times a day (BID) | ORAL | 2 refills | Status: AC

## 2024-04-05 NOTE — Patient Instructions (Signed)
 It was nice to see you today,  We addressed the following topics today: - You can continue taking Celebrex  for pain. I will send a new prescription for you. You can take 100 mg twice a day or 200 mg once a day. It is okay to stop it if the pain gets better. - I am sending a referral for you to go to physical therapy at [facility] in [place]. They will work with you on stretching and strengthening. - For general pain relief, you can safely take Tylenol  1000 mg every 8 hours. Topical over the counter treatments are also helpful, as is topical heat and TENS therapy - Please keep your follow-up appointment in February. If your symptoms are not getting any better or are getting worse before then, please let us  know.  Have a great day,  Rolan Slain, MD

## 2024-04-05 NOTE — Assessment & Plan Note (Signed)
-   Presents with anterior and posterior pelvic pain. Pain is worse with prolonged standing/sitting and improves with walking. Exam notable for anterior pelvic tenderness. Review of imaging from February and March 2025 (CT and hip X-ray) shows polyarticular degenerative changes, including in the hips (L>R) and pubic symphysis. Urine culture from recent ED visit was negative (<10,000 colonies), making UTI less likely. Symptoms are consistent with a musculoskeletal etiology, likely related to known degenerative joint disease. - Refer to physical therapy at [facility] in [place]. - Prescribed Celebrex  100 mg BID PRN for pain. Can use remaining supply and new prescription will be sent. - Discussed other pain management options including Tylenol , topical agents (Voltaren gel, lidocaine ), TENS unit, and SNRIs/SSRIs. Will stick with Celebrex  for now. - No new imaging ordered at this time given recent studies. - Follow up in February 2026 as scheduled. Advised to contact office sooner if symptoms do not improve or worsen.

## 2024-04-05 NOTE — Progress Notes (Unsigned)
 Established Patient Office Visit  Subjective   Patient ID: Bianca Fernandez, female    DOB: 1956-01-18  Age: 68 y.o. MRN: [id number]  Chief Complaint  Patient presents with   Hospitalization Follow-up    HPI  Subjective - Follow-up after ED visit on 03/19/2024 for possible UTI. Reports persistent pain in the front and back, described as soreness. - Pain is constant, not affected by urination. No dysuria. Reports increased urinary frequency since menopause. - Pain is worse with prolonged standing and sitting on the couch. Improves with walking. - Denies radiation of pain down the legs. - No blood in urine. - Experienced nausea with tramadol , which has resolved.  Medications Current medications include amlodipine , estrogen cream. Takes ibuprofen intermittently. Has taken Celebrex  in the past with some benefit.  PMH, PSH, FH, Social Hx PMHx: Prolapsed bladder and rectum post-menopause. 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 PSHx: Hysterectomy. Gyn Hx: History of three vaginal deliveries of large babies. FH: Not discussed. Social Hx: Not discussed.  ROS Gen: Denies fever, chills. GI: Reports prior nausea with tramadol , now resolved. Denies diarrhea. GU: Reports urinary frequency. Denies dysuria, hematuria. MSK: Reports pain in back and anterior pelvis. Neuro: Denies pain radiating down legs.    The 10-year ASCVD risk score (Arnett DK, et al., 2019) is: 10.8%  Health Maintenance Due  Topic Date Due   Influenza Vaccine  12/11/2023   COVID-19 Vaccine (7 - 2025-26 season) 01/11/2024   Bone Density Scan  04/02/2024   Mammogram  04/06/2024      Objective:     BP 121/73   Pulse 89   Ht 5' 8 (1.727 m)   Wt 163 lb 12.8 oz (74.3 kg)   SpO2 98%   BMI 24.91 kg/m  {Vitals History (Optional):23777}  Physical Exam Gen: alert, oriented Pulm: no respiratory distress MSK:  Tenderness over the anterior pelvis. No tenderness to palpation over the posterior superior iliac spine (PSIS) area, though this is the reported location of back pain. FABER test does not reproduce pain. No pain on leg extension. Psych: pleasant affect   No results found for any visits on 04/05/24.      Assessment & Plan:   Pelvic pain Assessment & Plan: - Presents with anterior and posterior pelvic pain. Pain is worse with prolonged standing/sitting and improves with walking. Exam notable for anterior pelvic tenderness. Review of imaging from February and March 2025 (CT and hip X-ray) shows polyarticular degenerative changes, including in the hips (L>R) and pubic symphysis. Urine culture from recent ED visit was negative (<10,000 colonies), making UTI less likely. Symptoms are consistent with a musculoskeletal etiology, likely related to known degenerative joint disease. - Refer to physical therapy at [facility] in [place]. - Prescribed Celebrex  100 mg BID PRN for pain. Can use remaining supply and new prescription will be sent. - Discussed other pain management options including Tylenol , topical agents (Voltaren gel, lidocaine ), TENS unit, and SNRIs/SSRIs. Will stick with Celebrex  for now. - No new imaging ordered at this time given recent studies. - Follow up in February 2026 as scheduled. Advised to contact office sooner if symptoms do not improve or worsen.   Cystocele with prolapse Assessment & Plan: - History of bladder and rectal prolapse. Prior evaluation by urology [phone]) including cystoscopy in 2020. Has been treated with estrogen cream. Current symptoms not thought to be related to prolapse. - Will consider referral to urogynecology if pain  persists despite PT and is not clearly musculoskeletal.   Other orders -     Celecoxib ; Take 1 capsule (100 mg total) by mouth 2 (two) times daily.  Dispense: 60 capsule; Refill: 2     Return if symptoms worsen or fail to improve.     Toribio MARLA Slain, MD

## 2024-04-05 NOTE — Assessment & Plan Note (Signed)
-   History of bladder and rectal prolapse. Prior evaluation by urology [phone]) including cystoscopy in 2020. Has been treated with estrogen cream. Current symptoms not thought to be related to prolapse. - Will consider referral to urogynecology if pain persists despite PT and is not clearly musculoskeletal.

## 2024-05-17 LAB — HM DEXA SCAN

## 2024-05-17 LAB — HM MAMMOGRAPHY

## 2024-05-28 ENCOUNTER — Other Ambulatory Visit: Payer: Self-pay | Admitting: Family Medicine

## 2024-06-09 ENCOUNTER — Other Ambulatory Visit

## 2024-06-09 DIAGNOSIS — E782 Mixed hyperlipidemia: Secondary | ICD-10-CM

## 2024-06-10 ENCOUNTER — Ambulatory Visit: Payer: Self-pay | Admitting: Family Medicine

## 2024-06-10 LAB — LIPID PANEL
Chol/HDL Ratio: 2.6 ratio (ref 0.0–4.4)
Cholesterol, Total: 194 mg/dL (ref 100–199)
HDL: 74 mg/dL
LDL Chol Calc (NIH): 107 mg/dL — ABNORMAL HIGH (ref 0–99)
Triglycerides: 72 mg/dL (ref 0–149)
VLDL Cholesterol Cal: 13 mg/dL (ref 5–40)

## 2024-06-16 ENCOUNTER — Encounter: Payer: Self-pay | Admitting: Family Medicine

## 2024-06-16 ENCOUNTER — Ambulatory Visit: Admitting: Family Medicine

## 2024-06-16 VITALS — BP 127/76 | HR 90 | Ht 68.0 in | Wt 165.4 lb

## 2024-06-16 DIAGNOSIS — R102 Pelvic and perineal pain unspecified side: Secondary | ICD-10-CM

## 2024-06-16 DIAGNOSIS — I10 Essential (primary) hypertension: Secondary | ICD-10-CM

## 2024-06-16 DIAGNOSIS — E782 Mixed hyperlipidemia: Secondary | ICD-10-CM

## 2024-06-16 DIAGNOSIS — M810 Age-related osteoporosis without current pathological fracture: Secondary | ICD-10-CM

## 2024-06-16 DIAGNOSIS — E559 Vitamin D deficiency, unspecified: Secondary | ICD-10-CM

## 2024-06-16 DIAGNOSIS — Z0000 Encounter for general adult medical examination without abnormal findings: Secondary | ICD-10-CM

## 2024-06-16 DIAGNOSIS — R7309 Other abnormal glucose: Secondary | ICD-10-CM

## 2024-06-16 NOTE — Assessment & Plan Note (Signed)
 Cholesterol slightly elevated with high risk score due to age. Lifestyle modifications recommended. - Recheck cholesterol levels in six months during physical exam. - Encouraged lifestyle modifications including diet and exercise.

## 2024-06-16 NOTE — Progress Notes (Unsigned)
" ° ° °  Subjective   Patient ID: Bianca Fernandez, female    DOB: 1956-05-06  Age: 69 y.o. MRN: [id number]  Chief Complaint  Patient presents with   Medical Management of Chronic Issues     History of Present Illness   Bianca Fernandez is a 69 year old female who presents with concerns about intermittent strange sensations and blood pressure fluctuations.  She describes intermittent nonpainful strange sensations without weakness or dizziness. During these episodes her blood pressure is usually in the low 100s and once dropped into the 80s when she had not eaten, then returned to normal after eating.  She has bone density issues and has had bone density scans for the past two years but has not taken osteoporosis medication. She takes 5,000 IU of vitamin D  and 630 mg of calcium daily and also gets calcium from her diet. She is hesitant to increase calcium because of concern about kidney stones based on her husbands experience and her sisters fractures despite improved bone density on medication.  She has elevated cholesterol and feels recent levels are related to eating during the holidays. She thinks increasing physical activity and reducing stress will help manage her cholesterol.  She has chronic back and pelvic pain that has improved this year. She uses Celebrex  as needed for pain but has not needed it recently.          The 10-year ASCVD risk score (Arnett DK, et al., 2019) is: 11.6%  Health Maintenance Due  Topic Date Due   COVID-19 Vaccine (7 - 2025-26 season) 01/11/2024      Objective:     BP 127/76   Pulse 90   Ht 5' 8 (1.727 m)   Wt 165 lb 6.4 oz (75 kg)   SpO2 100%   BMI 25.15 kg/m  {Vitals History (Optional):23777}  Physical Exam     Gen: alert, oriented Pulm: no respiratory distress Psych: pleasant affect         Assessment & Plan:   Osteoporosis, unspecified osteoporosis type, unspecified pathological fracture presence Assessment & Plan: Discussed  concerns about osteoporosis medication side effects. Pt concerned bc her sister developed osteonecrosis w/ treatment.   - pt will discuss medication options with gynecologist - provided info on evenity injections - Continue vitamin D  and calcium supplementation.   Mixed hyperlipidemia Assessment & Plan: Cholesterol slightly elevated with high risk score due to age. Lifestyle modifications recommended. - Recheck cholesterol levels in six months during physical exam. - Encouraged lifestyle modifications including diet and exercise.   Essential hypertension Assessment & Plan: Blood pressure stable with occasional low readings.  - continue 2.5mg  amlodipine    sacroiliac and Pelvic pain Assessment & Plan: Pain improved. No physical therapy attended. Urogynecologist appointment scheduled. - Attend urogynecologist appointment in May.   Healthcare maintenance Assessment & Plan: Mammogram normal. Bone density test showed osteoporosis - Continue vitamin D  and calcium supplementation.        Return in about 6 months (around 12/14/2024) for physical.    Toribio MARLA Slain, MD  "

## 2024-06-16 NOTE — Assessment & Plan Note (Signed)
 Pain improved. No physical therapy attended. Urogynecologist appointment scheduled. - Attend urogynecologist appointment in May.

## 2024-06-16 NOTE — Assessment & Plan Note (Signed)
 Mammogram normal. Bone density test showed osteoporosis - Continue vitamin D  and calcium supplementation.

## 2024-06-16 NOTE — Assessment & Plan Note (Signed)
 Discussed concerns about osteoporosis medication side effects. Pt concerned bc her sister developed osteonecrosis w/ treatment.   - pt will discuss medication options with gynecologist - provided info on evenity injections - Continue vitamin D  and calcium supplementation.

## 2024-06-16 NOTE — Assessment & Plan Note (Signed)
 Blood pressure stable with occasional low readings.  - continue 2.5mg  amlodipine 

## 2024-06-16 NOTE — Patient Instructions (Addendum)
" ° °  YOUR PLAN: OSTEOPOROSIS: You have bone density issues and have been hesitant to take osteoporosis medication due to concerns about side effects. -Discuss newer anabolic medications with your gynecologist. -Continue taking vitamin D  and [phone] mg of calcium daily.  HYPERLIPIDEMIA: Your cholesterol levels are slightly elevated, and you believe this is related to holiday eating. -Recheck your cholesterol levels in six months during your physical exam. -Increase physical activity and reduce stress to help manage your cholesterol.  HYPERTENSION: Your blood pressure is stable with occasional low readings, and your symptoms are not related to your blood pressure or amlodipine . -Continue your current antihypertensive regimen.  CHRONIC BACK AND PELVIC PAIN: Your chronic back and pelvic pain has improved, and you have an upcoming appointment with a urogynecologist. -Attend your urogynecologist appointment in May.      Contains text generated by [employer].   "

## 2024-12-06 ENCOUNTER — Other Ambulatory Visit

## 2024-12-15 ENCOUNTER — Encounter: Admitting: Family Medicine
